# Patient Record
Sex: Female | Born: 1940 | Race: White | Hispanic: No | Smoking: Former smoker
Health system: Southern US, Community
[De-identification: ages and names within clinical notes are randomized; demographics above are authoritative.]

## PROBLEM LIST (undated history)

## (undated) DIAGNOSIS — F419 Anxiety disorder, unspecified: Secondary | ICD-10-CM

## (undated) DIAGNOSIS — J9612 Chronic respiratory failure with hypercapnia: Secondary | ICD-10-CM

## (undated) DIAGNOSIS — G4700 Insomnia, unspecified: Secondary | ICD-10-CM

## (undated) DIAGNOSIS — J9611 Chronic respiratory failure with hypoxia: Secondary | ICD-10-CM

## (undated) DIAGNOSIS — E119 Type 2 diabetes mellitus without complications: Secondary | ICD-10-CM

## (undated) DIAGNOSIS — C189 Malignant neoplasm of colon, unspecified: Secondary | ICD-10-CM

## (undated) DIAGNOSIS — J449 Chronic obstructive pulmonary disease, unspecified: Secondary | ICD-10-CM

## (undated) HISTORY — DX: Malignant neoplasm of colon, unspecified: C18.9

## (undated) HISTORY — PX: COLON SURGERY: SHX602

## (undated) HISTORY — DX: Insomnia, unspecified: G47.00

## (undated) HISTORY — PX: OTHER SURGICAL HISTORY: SHX169

## (undated) HISTORY — DX: Anxiety disorder, unspecified: F41.9

## (undated) HISTORY — PX: FOOT SURGERY: SHX648

---

## 2000-06-21 DIAGNOSIS — C189 Malignant neoplasm of colon, unspecified: Secondary | ICD-10-CM

## 2000-06-21 HISTORY — DX: Malignant neoplasm of colon, unspecified: C18.9

## 2000-12-18 ENCOUNTER — Encounter: Payer: Self-pay | Admitting: General Surgery

## 2000-12-18 ENCOUNTER — Inpatient Hospital Stay (HOSPITAL_COMMUNITY): Admission: EM | Admit: 2000-12-18 | Discharge: 2000-12-28 | Payer: Self-pay | Admitting: Emergency Medicine

## 2000-12-18 ENCOUNTER — Encounter: Payer: Self-pay | Admitting: Emergency Medicine

## 2000-12-18 ENCOUNTER — Encounter (INDEPENDENT_AMBULATORY_CARE_PROVIDER_SITE_OTHER): Payer: Self-pay | Admitting: Specialist

## 2000-12-19 ENCOUNTER — Encounter: Payer: Self-pay | Admitting: General Surgery

## 2000-12-20 ENCOUNTER — Encounter: Payer: Self-pay | Admitting: General Surgery

## 2001-01-30 ENCOUNTER — Other Ambulatory Visit: Admission: RE | Admit: 2001-01-30 | Discharge: 2001-01-30 | Payer: Self-pay | Admitting: General Surgery

## 2001-02-13 ENCOUNTER — Encounter (INDEPENDENT_AMBULATORY_CARE_PROVIDER_SITE_OTHER): Payer: Self-pay | Admitting: *Deleted

## 2001-02-13 ENCOUNTER — Ambulatory Visit (HOSPITAL_BASED_OUTPATIENT_CLINIC_OR_DEPARTMENT_OTHER): Admission: RE | Admit: 2001-02-13 | Discharge: 2001-02-13 | Payer: Self-pay | Admitting: General Surgery

## 2001-04-28 ENCOUNTER — Encounter: Payer: Self-pay | Admitting: Hematology and Oncology

## 2001-04-28 ENCOUNTER — Ambulatory Visit (HOSPITAL_COMMUNITY): Admission: RE | Admit: 2001-04-28 | Discharge: 2001-04-28 | Payer: Self-pay | Admitting: Hematology and Oncology

## 2001-10-03 ENCOUNTER — Ambulatory Visit (HOSPITAL_COMMUNITY): Admission: RE | Admit: 2001-10-03 | Discharge: 2001-10-03 | Payer: Self-pay | Admitting: Hematology and Oncology

## 2001-10-03 ENCOUNTER — Encounter: Payer: Self-pay | Admitting: Hematology and Oncology

## 2001-11-16 ENCOUNTER — Ambulatory Visit (HOSPITAL_COMMUNITY): Admission: RE | Admit: 2001-11-16 | Discharge: 2001-11-16 | Payer: Self-pay | Admitting: Gastroenterology

## 2001-11-16 ENCOUNTER — Encounter (INDEPENDENT_AMBULATORY_CARE_PROVIDER_SITE_OTHER): Payer: Self-pay | Admitting: *Deleted

## 2002-09-18 ENCOUNTER — Ambulatory Visit (HOSPITAL_COMMUNITY): Admission: RE | Admit: 2002-09-18 | Discharge: 2002-09-18 | Payer: Self-pay | Admitting: Hematology and Oncology

## 2002-09-18 ENCOUNTER — Encounter: Payer: Self-pay | Admitting: Hematology and Oncology

## 2003-01-23 ENCOUNTER — Encounter: Payer: Self-pay | Admitting: Oncology

## 2003-01-23 ENCOUNTER — Ambulatory Visit (HOSPITAL_COMMUNITY): Admission: RE | Admit: 2003-01-23 | Discharge: 2003-01-23 | Payer: Self-pay | Admitting: Oncology

## 2003-06-06 ENCOUNTER — Ambulatory Visit (HOSPITAL_COMMUNITY): Admission: RE | Admit: 2003-06-06 | Discharge: 2003-06-06 | Payer: Self-pay | Admitting: Oncology

## 2003-10-09 ENCOUNTER — Encounter: Admission: RE | Admit: 2003-10-09 | Discharge: 2003-10-09 | Payer: Self-pay | Admitting: Oncology

## 2003-11-19 ENCOUNTER — Ambulatory Visit (HOSPITAL_COMMUNITY): Admission: RE | Admit: 2003-11-19 | Discharge: 2003-11-19 | Payer: Self-pay | Admitting: Oncology

## 2003-12-20 ENCOUNTER — Ambulatory Visit (HOSPITAL_COMMUNITY): Admission: RE | Admit: 2003-12-20 | Discharge: 2003-12-20 | Payer: Self-pay | Admitting: Oncology

## 2004-04-24 ENCOUNTER — Ambulatory Visit: Payer: Self-pay | Admitting: Oncology

## 2004-04-29 ENCOUNTER — Ambulatory Visit (HOSPITAL_COMMUNITY): Admission: RE | Admit: 2004-04-29 | Discharge: 2004-04-29 | Payer: Self-pay | Admitting: Oncology

## 2004-09-21 ENCOUNTER — Ambulatory Visit (HOSPITAL_COMMUNITY): Admission: RE | Admit: 2004-09-21 | Discharge: 2004-09-21 | Payer: Self-pay | Admitting: Oncology

## 2004-10-01 ENCOUNTER — Ambulatory Visit: Payer: Self-pay | Admitting: Oncology

## 2005-03-29 ENCOUNTER — Ambulatory Visit: Payer: Self-pay | Admitting: Oncology

## 2005-09-17 ENCOUNTER — Encounter: Admission: RE | Admit: 2005-09-17 | Discharge: 2005-09-17 | Payer: Self-pay | Admitting: Oncology

## 2005-09-29 ENCOUNTER — Ambulatory Visit: Payer: Self-pay | Admitting: Oncology

## 2005-09-30 LAB — CBC WITH DIFFERENTIAL/PLATELET
Basophils Absolute: 0 10*3/uL (ref 0.0–0.1)
EOS%: 0.5 % (ref 0.0–7.0)
HGB: 14.6 g/dL (ref 11.6–15.9)
MCH: 31.4 pg (ref 26.0–34.0)
NEUT#: 7.6 10*3/uL — ABNORMAL HIGH (ref 1.5–6.5)
RDW: 13 % (ref 11.3–14.5)
lymph#: 0.8 10*3/uL — ABNORMAL LOW (ref 0.9–3.3)

## 2005-09-30 LAB — COMPREHENSIVE METABOLIC PANEL
ALT: 23 U/L (ref 0–40)
AST: 12 U/L (ref 0–37)
Albumin: 4.5 g/dL (ref 3.5–5.2)
BUN: 11 mg/dL (ref 6–23)
Calcium: 10.8 mg/dL — ABNORMAL HIGH (ref 8.4–10.5)
Chloride: 95 mEq/L — ABNORMAL LOW (ref 96–112)
Potassium: 4.7 mEq/L (ref 3.5–5.3)
Sodium: 140 mEq/L (ref 135–145)
Total Protein: 7.4 g/dL (ref 6.0–8.3)

## 2005-09-30 LAB — CEA: CEA: 5.5 ng/mL — ABNORMAL HIGH (ref 0.0–5.0)

## 2005-09-30 LAB — LACTATE DEHYDROGENASE: LDH: 151 U/L (ref 94–250)

## 2005-10-04 ENCOUNTER — Ambulatory Visit (HOSPITAL_COMMUNITY): Admission: RE | Admit: 2005-10-04 | Discharge: 2005-10-04 | Payer: Self-pay | Admitting: Oncology

## 2005-10-21 LAB — FECAL OCCULT BLOOD, GUAIAC: Occult Blood: POSITIVE

## 2006-01-31 ENCOUNTER — Ambulatory Visit: Payer: Self-pay | Admitting: Internal Medicine

## 2006-03-14 ENCOUNTER — Ambulatory Visit: Payer: Self-pay | Admitting: Internal Medicine

## 2006-03-29 ENCOUNTER — Ambulatory Visit: Payer: Self-pay | Admitting: Oncology

## 2006-03-31 LAB — CBC WITH DIFFERENTIAL/PLATELET
BASO%: 0.8 % (ref 0.0–2.0)
EOS%: 7.9 % — ABNORMAL HIGH (ref 0.0–7.0)
HCT: 40.1 % (ref 34.8–46.6)
LYMPH%: 23.9 % (ref 14.0–48.0)
MCH: 30.9 pg (ref 26.0–34.0)
MCHC: 33.8 g/dL (ref 32.0–36.0)
MONO#: 0.6 10*3/uL (ref 0.1–0.9)
NEUT%: 58.5 % (ref 39.6–76.8)
Platelets: 358 10*3/uL (ref 145–400)

## 2006-03-31 LAB — COMPREHENSIVE METABOLIC PANEL
ALT: 14 U/L (ref 0–40)
CO2: 28 mEq/L (ref 19–32)
Creatinine, Ser: 0.89 mg/dL (ref 0.40–1.20)
Total Bilirubin: 0.5 mg/dL (ref 0.3–1.2)

## 2006-03-31 LAB — CEA: CEA: 3.6 ng/mL (ref 0.0–5.0)

## 2006-07-26 ENCOUNTER — Ambulatory Visit: Payer: Self-pay | Admitting: Internal Medicine

## 2006-09-26 ENCOUNTER — Ambulatory Visit: Payer: Self-pay | Admitting: Oncology

## 2006-09-28 LAB — COMPREHENSIVE METABOLIC PANEL
ALT: 15 U/L (ref 0–35)
AST: 14 U/L (ref 0–37)
Albumin: 4.6 g/dL (ref 3.5–5.2)
CO2: 28 mEq/L (ref 19–32)
Calcium: 10.6 mg/dL — ABNORMAL HIGH (ref 8.4–10.5)
Chloride: 97 mEq/L (ref 96–112)
Potassium: 4.2 mEq/L (ref 3.5–5.3)
Total Protein: 7.8 g/dL (ref 6.0–8.3)

## 2006-09-28 LAB — CBC WITH DIFFERENTIAL/PLATELET
BASO%: 1 % (ref 0.0–2.0)
EOS%: 0.3 % (ref 0.0–7.0)
HGB: 14.2 g/dL (ref 11.6–15.9)
MCH: 30.7 pg (ref 26.0–34.0)
MCHC: 35.3 g/dL (ref 32.0–36.0)
MONO#: 0.4 10*3/uL (ref 0.1–0.9)
RDW: 11.2 % — ABNORMAL LOW (ref 11.3–14.5)
WBC: 7.9 10*3/uL (ref 3.9–10.0)
lymph#: 0.9 10*3/uL (ref 0.9–3.3)

## 2006-09-30 ENCOUNTER — Ambulatory Visit (HOSPITAL_COMMUNITY): Admission: RE | Admit: 2006-09-30 | Discharge: 2006-09-30 | Payer: Self-pay | Admitting: Oncology

## 2006-11-23 ENCOUNTER — Ambulatory Visit: Payer: Self-pay | Admitting: Internal Medicine

## 2007-09-26 ENCOUNTER — Ambulatory Visit: Payer: Self-pay | Admitting: Oncology

## 2007-12-29 ENCOUNTER — Ambulatory Visit: Payer: Self-pay | Admitting: Internal Medicine

## 2007-12-29 DIAGNOSIS — C189 Malignant neoplasm of colon, unspecified: Secondary | ICD-10-CM | POA: Insufficient documentation

## 2007-12-29 DIAGNOSIS — J449 Chronic obstructive pulmonary disease, unspecified: Secondary | ICD-10-CM | POA: Insufficient documentation

## 2007-12-29 LAB — CONVERTED CEMR LAB: Theophylline Lvl: 7.8 ug/mL — ABNORMAL LOW (ref 10.0–20.0)

## 2008-01-05 ENCOUNTER — Telehealth (INDEPENDENT_AMBULATORY_CARE_PROVIDER_SITE_OTHER): Payer: Self-pay | Admitting: *Deleted

## 2008-03-06 ENCOUNTER — Encounter (HOSPITAL_COMMUNITY): Admission: RE | Admit: 2008-03-06 | Discharge: 2008-05-30 | Payer: Self-pay | Admitting: Internal Medicine

## 2008-03-12 ENCOUNTER — Encounter: Payer: Self-pay | Admitting: Internal Medicine

## 2008-03-29 ENCOUNTER — Inpatient Hospital Stay (HOSPITAL_COMMUNITY): Admission: EM | Admit: 2008-03-29 | Discharge: 2008-04-11 | Payer: Self-pay | Admitting: Emergency Medicine

## 2008-03-29 ENCOUNTER — Ambulatory Visit: Payer: Self-pay | Admitting: Internal Medicine

## 2008-03-29 ENCOUNTER — Ambulatory Visit: Payer: Self-pay | Admitting: Cardiology

## 2008-04-01 ENCOUNTER — Encounter: Payer: Self-pay | Admitting: Internal Medicine

## 2008-04-04 ENCOUNTER — Encounter: Payer: Self-pay | Admitting: Critical Care Medicine

## 2008-04-08 ENCOUNTER — Telehealth (INDEPENDENT_AMBULATORY_CARE_PROVIDER_SITE_OTHER): Payer: Self-pay | Admitting: *Deleted

## 2008-04-11 ENCOUNTER — Encounter: Payer: Self-pay | Admitting: Internal Medicine

## 2008-04-12 ENCOUNTER — Telehealth (INDEPENDENT_AMBULATORY_CARE_PROVIDER_SITE_OTHER): Payer: Self-pay | Admitting: *Deleted

## 2008-04-18 ENCOUNTER — Telehealth (INDEPENDENT_AMBULATORY_CARE_PROVIDER_SITE_OTHER): Payer: Self-pay | Admitting: *Deleted

## 2008-04-22 ENCOUNTER — Telehealth: Payer: Self-pay | Admitting: Internal Medicine

## 2008-04-29 ENCOUNTER — Ambulatory Visit: Payer: Self-pay | Admitting: Internal Medicine

## 2008-05-08 ENCOUNTER — Encounter: Payer: Self-pay | Admitting: Internal Medicine

## 2008-05-08 DIAGNOSIS — J9611 Chronic respiratory failure with hypoxia: Secondary | ICD-10-CM | POA: Insufficient documentation

## 2008-06-03 ENCOUNTER — Telehealth: Payer: Self-pay | Admitting: Internal Medicine

## 2008-07-09 ENCOUNTER — Telehealth (INDEPENDENT_AMBULATORY_CARE_PROVIDER_SITE_OTHER): Payer: Self-pay | Admitting: *Deleted

## 2008-07-22 ENCOUNTER — Telehealth: Payer: Self-pay | Admitting: Emergency Medicine

## 2008-07-22 ENCOUNTER — Ambulatory Visit: Payer: Self-pay | Admitting: Internal Medicine

## 2008-07-22 ENCOUNTER — Inpatient Hospital Stay (HOSPITAL_COMMUNITY): Admission: EM | Admit: 2008-07-22 | Discharge: 2008-07-24 | Payer: Self-pay | Admitting: Emergency Medicine

## 2008-07-22 ENCOUNTER — Telehealth: Payer: Self-pay | Admitting: Internal Medicine

## 2008-07-29 ENCOUNTER — Ambulatory Visit: Payer: Self-pay | Admitting: Internal Medicine

## 2008-08-13 ENCOUNTER — Telehealth: Payer: Self-pay | Admitting: Internal Medicine

## 2008-08-27 ENCOUNTER — Encounter: Payer: Self-pay | Admitting: Internal Medicine

## 2008-09-17 ENCOUNTER — Ambulatory Visit: Payer: Self-pay | Admitting: Internal Medicine

## 2008-09-18 ENCOUNTER — Telehealth (INDEPENDENT_AMBULATORY_CARE_PROVIDER_SITE_OTHER): Payer: Self-pay | Admitting: *Deleted

## 2008-10-11 ENCOUNTER — Telehealth (INDEPENDENT_AMBULATORY_CARE_PROVIDER_SITE_OTHER): Payer: Self-pay | Admitting: *Deleted

## 2008-12-13 ENCOUNTER — Telehealth (INDEPENDENT_AMBULATORY_CARE_PROVIDER_SITE_OTHER): Payer: Self-pay | Admitting: *Deleted

## 2009-01-06 ENCOUNTER — Telehealth: Payer: Self-pay | Admitting: Internal Medicine

## 2009-01-13 ENCOUNTER — Telehealth (INDEPENDENT_AMBULATORY_CARE_PROVIDER_SITE_OTHER): Payer: Self-pay | Admitting: *Deleted

## 2009-01-16 ENCOUNTER — Telehealth: Payer: Self-pay | Admitting: Internal Medicine

## 2009-01-16 ENCOUNTER — Ambulatory Visit: Payer: Self-pay | Admitting: Internal Medicine

## 2009-01-22 ENCOUNTER — Encounter: Payer: Self-pay | Admitting: Internal Medicine

## 2009-02-25 ENCOUNTER — Telehealth (INDEPENDENT_AMBULATORY_CARE_PROVIDER_SITE_OTHER): Payer: Self-pay | Admitting: *Deleted

## 2009-03-18 ENCOUNTER — Encounter: Payer: Self-pay | Admitting: Internal Medicine

## 2009-03-20 ENCOUNTER — Encounter: Payer: Self-pay | Admitting: Internal Medicine

## 2009-04-08 ENCOUNTER — Emergency Department (HOSPITAL_COMMUNITY): Admission: EM | Admit: 2009-04-08 | Discharge: 2009-04-08 | Payer: Self-pay | Admitting: Emergency Medicine

## 2009-04-21 ENCOUNTER — Telehealth (INDEPENDENT_AMBULATORY_CARE_PROVIDER_SITE_OTHER): Payer: Self-pay | Admitting: *Deleted

## 2009-05-09 ENCOUNTER — Ambulatory Visit: Payer: Self-pay | Admitting: Internal Medicine

## 2009-05-12 ENCOUNTER — Telehealth (INDEPENDENT_AMBULATORY_CARE_PROVIDER_SITE_OTHER): Payer: Self-pay | Admitting: *Deleted

## 2009-05-14 DIAGNOSIS — G4700 Insomnia, unspecified: Secondary | ICD-10-CM | POA: Insufficient documentation

## 2009-06-04 ENCOUNTER — Telehealth (INDEPENDENT_AMBULATORY_CARE_PROVIDER_SITE_OTHER): Payer: Self-pay | Admitting: *Deleted

## 2009-06-05 ENCOUNTER — Encounter: Payer: Self-pay | Admitting: Internal Medicine

## 2009-06-06 ENCOUNTER — Encounter: Payer: Self-pay | Admitting: Internal Medicine

## 2009-06-24 ENCOUNTER — Encounter: Payer: Self-pay | Admitting: Internal Medicine

## 2009-07-21 ENCOUNTER — Ambulatory Visit: Payer: Self-pay | Admitting: Internal Medicine

## 2009-07-24 ENCOUNTER — Telehealth: Payer: Self-pay | Admitting: Internal Medicine

## 2009-08-04 ENCOUNTER — Telehealth: Payer: Self-pay | Admitting: Internal Medicine

## 2009-08-04 ENCOUNTER — Telehealth (INDEPENDENT_AMBULATORY_CARE_PROVIDER_SITE_OTHER): Payer: Self-pay | Admitting: *Deleted

## 2009-10-01 IMAGING — CR DG CHEST 1V PORT
1 series · 1 of 1 positions shown · non-contrast
Comparison: [REDACTED] portable chest x-ray 04/01/2008.

CLINICAL DATA: Respiratory arrest.  COPD.  Right PICC line.

PORTABLE CHEST - 1 VIEW

[view not recorded]
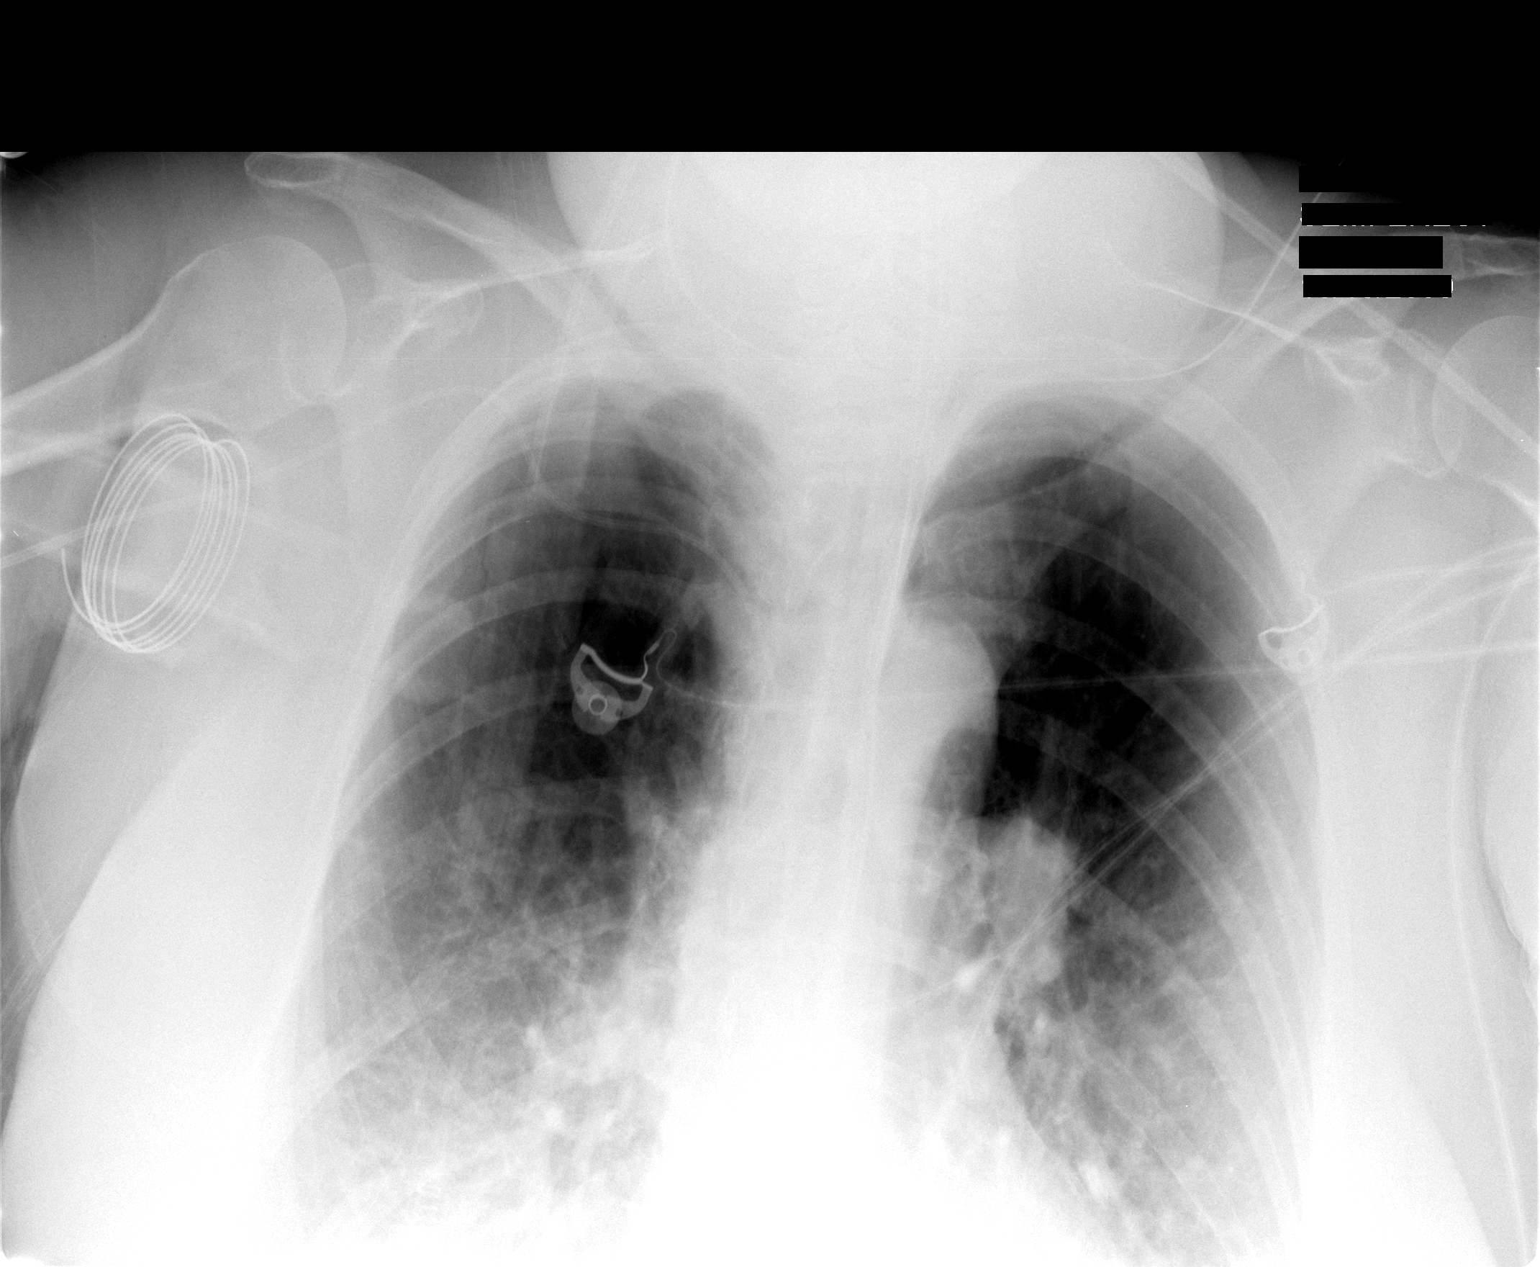

[1 of 1 positions shown; findings below may reference images not displayed]

FINDINGS: Again difficulty visualizing inferior aspect endotracheal
tube and right PICC line.  Endotracheal tube probably ends 2.7 cm
above carina with right PICC line ending in the probable distal
superior vena cava.  No pneumothorax seen.  The upper lung lucency
consistent with COPD and slight bibasilar pulmonary vascular
congestion findings stable.  Base of heart and lung bases currently
not included for assessment.
IMPRESSION: 1.  Stable probable satisfactory position of endotracheal tube and
right PICC line.
2.  Upper lung COPD with probable slight bibasilar vascular
congestion.
3.  Base of heart and lung base is not included for assessment.

## 2009-10-02 IMAGING — CR DG CHEST 1V PORT
1 series · 1 of 1 positions shown · non-contrast
Comparison: 04/02/2008

CLINICAL DATA: Respiratory distress, ventilator.

PORTABLE CHEST - 1 VIEW

[view not recorded]
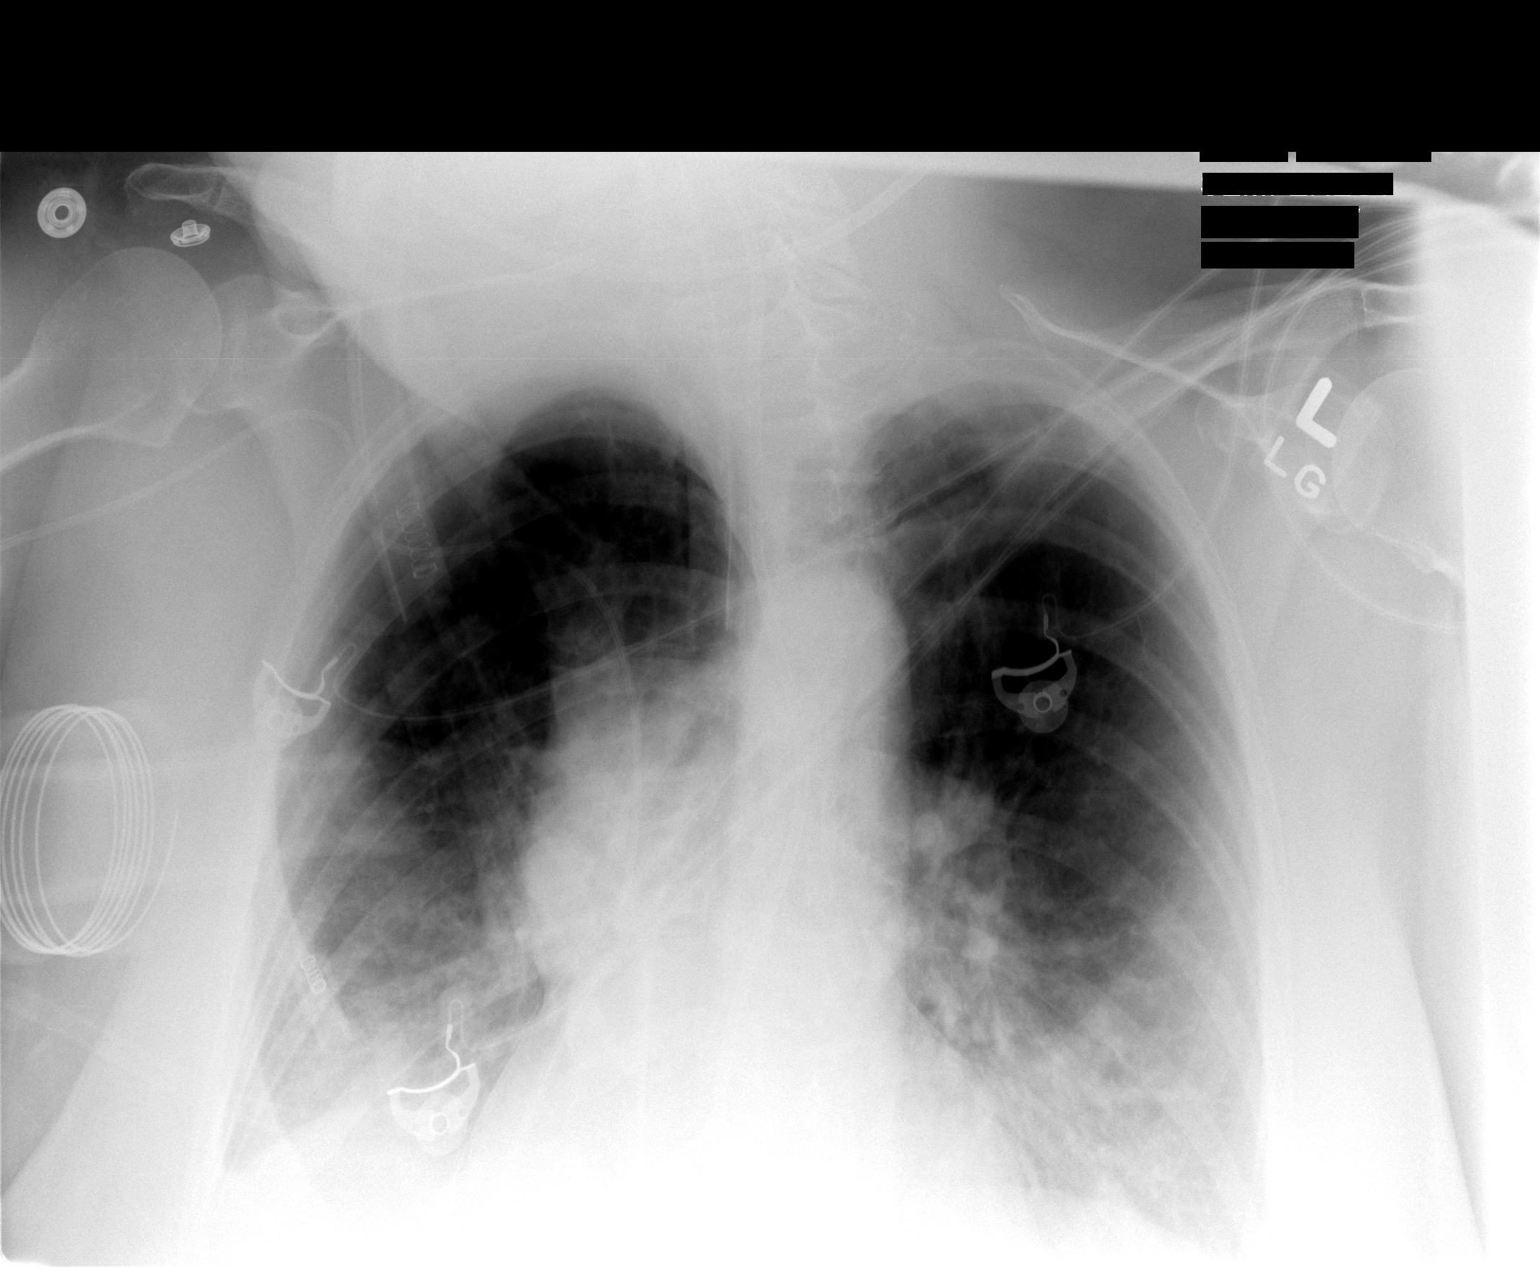

[1 of 1 positions shown; findings below may reference images not displayed]

FINDINGS: Endotracheal tube terminates approximately 2.2 cm above
the carina.  Right PICC tip projects over the SVC.  Feeding tube is
followed into the distal esophagus, with the tip projecting beyond
the inferior boundary of the film.  Heart size stable.  Mixed
interstitial and air space disease, and probable bilateral pleural
effusions persist.
IMPRESSION: Probable congestive heart failure, stable.

## 2009-10-09 ENCOUNTER — Encounter: Payer: Self-pay | Admitting: Internal Medicine

## 2009-12-26 ENCOUNTER — Telehealth (INDEPENDENT_AMBULATORY_CARE_PROVIDER_SITE_OTHER): Payer: Self-pay | Admitting: *Deleted

## 2009-12-30 ENCOUNTER — Ambulatory Visit: Payer: Self-pay | Admitting: Internal Medicine

## 2009-12-30 DIAGNOSIS — B370 Candidal stomatitis: Secondary | ICD-10-CM

## 2010-01-20 ENCOUNTER — Encounter: Payer: Self-pay | Admitting: Internal Medicine

## 2010-01-20 ENCOUNTER — Encounter: Admission: RE | Admit: 2010-01-20 | Discharge: 2010-01-20 | Payer: Self-pay | Admitting: Family Medicine

## 2010-01-29 ENCOUNTER — Telehealth (INDEPENDENT_AMBULATORY_CARE_PROVIDER_SITE_OTHER): Payer: Self-pay | Admitting: *Deleted

## 2010-04-01 ENCOUNTER — Telehealth: Payer: Self-pay | Admitting: Internal Medicine

## 2010-04-03 ENCOUNTER — Telehealth (INDEPENDENT_AMBULATORY_CARE_PROVIDER_SITE_OTHER): Payer: Self-pay | Admitting: *Deleted

## 2010-04-07 ENCOUNTER — Encounter: Payer: Self-pay | Admitting: Internal Medicine

## 2010-04-08 ENCOUNTER — Telehealth (INDEPENDENT_AMBULATORY_CARE_PROVIDER_SITE_OTHER): Payer: Self-pay | Admitting: *Deleted

## 2010-04-29 ENCOUNTER — Telehealth (INDEPENDENT_AMBULATORY_CARE_PROVIDER_SITE_OTHER): Payer: Self-pay | Admitting: *Deleted

## 2010-04-30 ENCOUNTER — Ambulatory Visit: Payer: Self-pay | Admitting: Internal Medicine

## 2010-04-30 DIAGNOSIS — R220 Localized swelling, mass and lump, head: Secondary | ICD-10-CM

## 2010-04-30 DIAGNOSIS — R221 Localized swelling, mass and lump, neck: Secondary | ICD-10-CM | POA: Insufficient documentation

## 2010-05-05 ENCOUNTER — Ambulatory Visit: Payer: Self-pay | Admitting: Thoracic Surgery

## 2010-05-10 ENCOUNTER — Encounter: Payer: Self-pay | Admitting: Internal Medicine

## 2010-05-21 ENCOUNTER — Encounter: Payer: Self-pay | Admitting: Internal Medicine

## 2010-05-21 ENCOUNTER — Telehealth: Payer: Self-pay | Admitting: Internal Medicine

## 2010-05-25 ENCOUNTER — Inpatient Hospital Stay (HOSPITAL_COMMUNITY)
Admission: RE | Admit: 2010-05-25 | Discharge: 2010-05-27 | Payer: Self-pay | Source: Home / Self Care | Attending: Thoracic Surgery | Admitting: Thoracic Surgery

## 2010-05-25 ENCOUNTER — Encounter: Payer: Self-pay | Admitting: Thoracic Surgery

## 2010-06-02 ENCOUNTER — Ambulatory Visit: Payer: Self-pay | Admitting: Thoracic Surgery

## 2010-06-04 ENCOUNTER — Encounter: Payer: Self-pay | Admitting: Internal Medicine

## 2010-06-18 ENCOUNTER — Telehealth: Payer: Self-pay | Admitting: Internal Medicine

## 2010-06-18 ENCOUNTER — Encounter: Payer: Self-pay | Admitting: Internal Medicine

## 2010-06-23 ENCOUNTER — Telehealth (INDEPENDENT_AMBULATORY_CARE_PROVIDER_SITE_OTHER): Payer: Self-pay | Admitting: *Deleted

## 2010-06-24 ENCOUNTER — Telehealth (INDEPENDENT_AMBULATORY_CARE_PROVIDER_SITE_OTHER): Payer: Self-pay | Admitting: *Deleted

## 2010-06-24 ENCOUNTER — Encounter: Payer: Self-pay | Admitting: Internal Medicine

## 2010-06-25 ENCOUNTER — Telehealth (INDEPENDENT_AMBULATORY_CARE_PROVIDER_SITE_OTHER): Payer: Self-pay | Admitting: *Deleted

## 2010-06-26 ENCOUNTER — Encounter: Payer: Self-pay | Admitting: Internal Medicine

## 2010-06-26 ENCOUNTER — Telehealth (INDEPENDENT_AMBULATORY_CARE_PROVIDER_SITE_OTHER): Payer: Self-pay | Admitting: *Deleted

## 2010-07-09 ENCOUNTER — Encounter: Payer: Self-pay | Admitting: Internal Medicine

## 2010-07-11 ENCOUNTER — Encounter: Payer: Self-pay | Admitting: Oncology

## 2010-07-12 ENCOUNTER — Encounter: Payer: Self-pay | Admitting: Oncology

## 2010-07-21 NOTE — Progress Notes (Signed)
Summary: rx renewals  Phone Note Call from Patient Call back at Home Phone [phone]   Caller: Patient Call For: YOUNG Summary of Call: pt needs refills. they have run out. she will give list to nurse when she speaks to her. pt wants rx's [facility]ed [facility] ([facility] pt) but pt didn't have a fx #. i offered to schedule an rov w/ dr young but pt declined at this time.  Initial call taken by: Tivis Ringer, CNA,  April 01, 2010 12:57 PM  Follow-up for Phone Call        Spoke with pt-needs breathing nebs RX's (3 kinds) [facility] [phone]; Faxing refill request to [place] to have CDY sign and fax back.Reynaldo Minium CMA  April 01, 2010 3:22 PM     Prescriptions: IPRATROPIUM BROMIDE 0.03 % SOLN (IPRATROPIUM BROMIDE) take four times a day  #120 x 3   Entered by:   Reynaldo Minium CMA   Authorized by:   Waymon Budge MD   Signed by:   Reynaldo Minium CMA on 04/01/2010   Method used:   Historical   RxID:   [id number] BUDESONIDE 0.5 MG/2ML SUSP (BUDESONIDE) take 1 by mouth two times a day  #60 x 3   Entered by:   Reynaldo Minium CMA   Authorized by:   Waymon Budge MD   Signed by:   Reynaldo Minium CMA on 04/01/2010   Method used:   Historical   RxID:   [id number] ALBUTEROL SULFATE (2.5 MG/3ML) 0.083% NEBU (ALBUTEROL SULFATE) 1 vial in nebulizer 4 times per day as needed  #120 x 3   Entered by:   Reynaldo Minium CMA   Authorized by:   Waymon Budge MD   Signed by:   Reynaldo Minium CMA on 04/01/2010   Method used:   Historical   RxID:   [id number]

## 2010-07-21 NOTE — Medication Information (Signed)
Summary[facility]  [facility]   Imported By: Sherian Rein 05/25/2010 11:57:26  _____________________________________________________________________  External Attachment:    Type:   Image     Comment:   External Document

## 2010-07-21 NOTE — Medication Information (Signed)
Summary: Nebulizer/[facility]  [facility]ebulizer/[facility]   Imported By: Sherian Rein 10/14/2009 10:52:06  _____________________________________________________________________  External Attachment:    Type:   Image     Comment:   External Document

## 2010-07-21 NOTE — Progress Notes (Signed)
Summary: REQUESTING Meredith [name] ASAP  Phone Note Call from Patient Call back at Home Phone [phone]   Caller: pt Call For: CY Summary of Call: pt requesting a refill on [facility]olam. [facility] [place] Initial call taken by: Carron Curie CMA,  May 21, 2010 3:07 PM  Follow-up for Phone Call        Pt requesting refill on alprazolam 1mg .  [facility] [place] Last OV 04/30/2010, No pending OV Rx last faxed to pharm on 01/29/2010 #90 x 3 Dr. Maple Hudson, pls advise if ok.  If so, # of fills.  Thanks!  Pt calling on status of her rx.Darletta Moll  May 22, 2010 1:15 PM  Follow-up by: Gweneth Dimitri RN,  May 21, 2010 3:16 PM  Additional Follow-up for Phone Call Additional follow up Details #1::        Dr. Maple Hudson, pt calling back re: requesting xanax rx.  Would like this dones ASAP.  Also would like you to know neck surgery is on hold for now.  Pls advise. Gweneth Dimitri RN  May 22, 2010 1:58 PM     Additional Follow-up for Phone Call Additional follow up Details #2::    Per CDY-okay to refill Alprazolam 1mg  #90 take 1 three times a day as needed with 3 refills.Reynaldo Minium CMA  May 22, 2010 3:59 PM   rx sent. pt aware.Carron Curie CMA  May 22, 2010 4:08 PM   Prescriptions: ALPRAZOLAM 1 MG TABS (ALPRAZOLAM) 1/2- 1 three times a day as needed  #90 x 3   Entered by:   Carron Curie CMA   Authorized by:   Waymon Budge MD   Signed by:   Carron Curie CMA on 05/22/2010   Method used:   Telephoned to ...       [facility]  [place] [address] (retail)       [address]       [place], [place]  [address]       Ph: [phone] or [phone]       Fax: [phone]   RxID:   [id number]

## 2010-07-21 NOTE — Progress Notes (Signed)
Summary: Requesting cy recs  Phone Note Call from Patient   Caller: Daughter lisa Call For: young Summary of Call: have questions about done at [facility] imaging. Initial call taken by: Rickard Patience,  April 29, 2010 3:41 PM  Follow-up for Phone Call        Called, spoke with pt.  She states she had a lump develop on her neck and went to PCP for this.  PCP sent her to [facility] for Scan.  Per pt, PCP is now rec surgery and has made appt with Dr. Edwyna Shell on 11.15.11.  Pt is requesting CY look at images and advise if he thinks surgery will be ok.  Also would like to know if he will want to see her before Feb 2012 as that is when she is supposed to follow up.  Dr. Maple Hudson, pls advise.  Thanks! Follow-up by: Gweneth Dimitri RN,  April 29, 2010 4:05 PM  Additional Follow-up for Phone Call Additional follow up Details #1::        Spoke with patient-appt made for 2pm Thursday with CDY to review scan results.Reynaldo Minium CMA  April 29, 2010 5:07 PM

## 2010-07-21 NOTE — Progress Notes (Signed)
Summary: ALPRAZOLAM- Refill already sent, ATC NA  Phone Note Call from Patient Call back at Home Phone [phone]   Caller: Patient Call For: young Summary of Call: pt needs rx for alprazolam [facility] [place].  Initial call taken by: Tivis Ringer, CNA,  January 29, 2010 12:14 PM  Follow-up for Phone Call        Katie already faxed rx for this- ATC and inform pt, NA and no option to leave a msg, WCB. Vernie Murders  January 29, 2010 12:18 PM  Spoke with pt and notified that rx was already sent  Follow-up by: Vernie Murders,  January 29, 2010 1:53 PM

## 2010-07-21 NOTE — Progress Notes (Signed)
Summary: prescript  Phone Note Call from Patient   Caller: Patient Call For: young Summary of Call: need refill for [facility]raz[facility]am [facility] [place] Initial call taken by: Rickard Patience,  December 26, 2009 9:34 AM  Follow-up for Phone Call        Pt's last OV 07/21/2009 and was advised by CY to follow up in 4 months. Pt had CAN appointments 11-18-2009 and 12-12-2009, no pending OV scheduled. Please advise. Thanks. Zackery Barefoot CMA  December 26, 2009 9:51 AM   Medication Allergy: Morphine  Additional Follow-up for Phone Call Additional follow up Details #1::        Denied.  Additional Follow-up by: Waymon Budge MD,  December 26, 2009 12:36 PM    Additional Follow-up for Phone Call Additional follow up Details #2::    Spoke with pt and explained that due to her not following up as advised, we can not refill this rx at this time.  P-t verbalized understanding and appt was sched for 12/30/09 for followup. Follow-up by: Vernie Murders,  December 26, 2009 1:38 PM

## 2010-07-21 NOTE — Progress Notes (Signed)
Summary: prescriptions    Phone Note From Pharmacy Call back at [phone] ext [phone]   Caller: patricia//[facility]  Call For: young  Summary of Call: States they haven't received fax for rx renewal, pls advise. Initial call taken by: Darletta Moll,  April 03, 2010 3:46 PM  Follow-up for Phone Call        LMOVM for [facility] TCB. Vernie Murders  April 03, 2010 4:13 PM  [facility] for patricia TCB.  Arman Filter LPN  April 06, 2010 5:14 PM   called and spoke with patricia from [facility].  She states she never received rx we faxed on 04/01/2010 for refill on pt's neb meds.  informed her i would refax to their fax # at [phone].  printed off rx for CY to sign so I can fax to [facility].  Arman Filter LPN  April 08, 2010 3:04 PM   Additional Follow-up for Phone Call Additional follow up Details #1::        RX's signed and faxed as requested.Reynaldo Minium CMA  April 08, 2010 4:37 PM     Prescriptions: IPRATROPIUM BROMIDE 0.03 % SOLN (IPRATROPIUM BROMIDE) take four times a day  #120 x 3   Entered by:   Arman Filter LPN   Authorized by:   Waymon Budge MD   Signed by:   Arman Filter LPN on 29/56/2130   Method used:   Printed then faxed to ...       [facility]  [place] [address] (retail)       [address]       [place], [place]  [address]       Ph: [phone] or [phone]       Fax: [phone]   RxID:   [id number] BUDESONIDE 0.5 MG/2ML SUSP (BUDESONIDE) take 1 by mouth two times a day  #60 x 3   Entered by:   Arman Filter LPN   Authorized by:   Waymon Budge MD   Signed by:   Arman Filter LPN on 95/18/8416   Method used:   Printed then faxed to ...       [facility]  [place] [address] (retail)       [address]       [place], [place]  [address]       Ph: [phone] or [phone]       Fax: [phone]   RxID:   [id number] ALBUTEROL SULFATE (2.5 MG/3ML) 0.083% NEBU (ALBUTEROL SULFATE) 1 vial in nebulizer 4 times per day as needed  #120 x 3   Entered by:    Arman Filter LPN   Authorized by:   Waymon Budge MD   Signed by:   Arman Filter LPN on 94/85/4627   Method used:   Printed then faxed to ...       [facility]  [place] [address] (retail)       [address]       [place], [place]  [address]       Ph: [phone] or [phone]       Fax: [phone]   RxID:   [id number]

## 2010-07-21 NOTE — Progress Notes (Signed)
Summary: fax verification  Phone Note Call from Patient   Caller: Daughter-Tammy Call For: young Summary of Call: caller wants to verify that FMLA papers that were faxed to our office fri 08/01/09 have been received. call tammy calhoun [phone] Initial call taken by: Tivis Ringer, CNA,  August 04, 2009 9:30 AM  Follow-up for Phone Call        Florentina Addison, have you received these forms yet? Please advise thanks! Vernie Murders  August 04, 2009 9:35 AM   As of this time I have not reveived any papers for pt; please advise pt of this and also the policy of [employer] taking care of FMLA; papers should be sent there. Reynaldo Minium CMA  August 04, 2009 12:13 PM   Additional Follow-up for Phone Call Additional follow up Details #1::        [email or web address] Vernie Murders  August 04, 2009 12:15 PM   Tammy returning call.  Carlyle Lipa had not received paperwork.  Said she would refax.  Lehman Prom  August 04, 2009 12:30 PM  Katie have you received thsi paperwork yet? Carron Curie CMA  August 06, 2009 9:29 AM     Additional Follow-up for Phone Call Additional follow up Details #2::    Spoke with Fleet Contras in [facility]; she has gotten the papers and will work on it today and send for CDY to sign; once done they will contact her to pick up in medical records. Reynaldo Minium CMA  August 06, 2009 11:41 AM

## 2010-07-21 NOTE — Progress Notes (Signed)
Summary: results  Phone Note Call from Patient   Caller: Patient Call For: Meredith [name] Summary of Call: pt calling for chest xray results Initial call taken by: Rickard Patience,  July 24, 2009 1:52 PM  Follow-up for Phone Call        results unsigned in EMR.  Will forward message to CY-please advise.  A copy of CXR is attached.  Thanks! Gweneth Dimitri RN  July 24, 2009 2:14 PM   Additional Follow-up for Phone Call Additional follow up Details #1::        Ready Additional Follow-up by: Waymon Budge MD,  July 24, 2009 2:52 PM     Appended Document: results Pt aware of CXR results as stated by CY.  She verbalized understanding and had no further questions.

## 2010-07-21 NOTE — Medication Information (Signed)
Summary[facility]  [facility]   Imported By: Sherian Rein 01/01/2010 14:01:13  _____________________________________________________________________  External Attachment:    Type:   Image     Comment:   External Document

## 2010-07-21 NOTE — Letter (Signed)
Summary: CMN for transport chair/[facility]  CMN for transport chair/[facility]   Imported By: Sherian Rein 06/27/2009 11:23:14  _____________________________________________________________________  External Attachment:    Type:   Image     Comment:   External Document

## 2010-07-21 NOTE — Progress Notes (Signed)
Summary: refill on neb meds  Phone Note Call from Patient Call back at Home Phone [phone]   Caller: Patient Call For: young Reason for Call: Refill Medication Summary of Call: Need a rx for [facility] to mail her albuterol sulfate 2.5mg . Initial call taken by: Darletta Moll,  April 08, 2010 1:20 PM  Follow-up for Phone Call        called and spoke with pt.  pt states [facility] still hasn't received rx we faxed on 04/01/2010.  informed pt i would refax these rx today. nothing further needed.  Aundra Millet Reynolds LPN  April 08, 2010 3:01 PM

## 2010-07-21 NOTE — Assessment & Plan Note (Signed)
Summary: review scan with patient/kcw   Primary Provider/Referring Provider:  Erenest Rasher - [place]  CC:  Review CT neck as 2nd opinon per pt..  History of Present Illness: July 21, 2009- COPD, chronic respirtatory failure, hx colon cancer.....................daughters here She had rash on right palm, then residual pins and needles Right palm. Dr Andrey Campanile speculated shingles and gave cortisone. Has finished burst and back to maintenance prednisone. She says pain is the problem now, since he saw here 2 weeks ago. Chest is unchanged, keeping cough productive of white mucus when she takes Neb. She stays on oxygen.  December 30, 2009- COPD, chronic respiratory failure, hx colon cancer.....................family here She coughs productively only when she uses her neb. Staying in mostly. Oxygen stays at 3L.The heat outdoors is drawing her energy, but nothing seems really new. Discussed med refills. She is curious about going back to [facility] and I am giving her the number so she can call..  April 30, 2010- COPD, chronic respiratory failure, hx colon cancer.....................family here Nurse-CC: Review CT neck as 2nd opinon per pt. Has developed a large soft prominence on right neck.  CT neck on August 2 showed large cystic lesion right neck. Dr Zachery Dakins saw and referred to Dr Edwyna Shell. Family wanted another opinion from me. We reviewed the CT neck images. She is using her meds regularly and denies recent change in her breathing. . Little cough. No pain or fever.   Preventive Screening-Counseling & Management  Alcohol-Tobacco     Smoking Status: quit > 6 months     Year Quit: 2010     Pack years: 40 years x 2 ppd     Passive Smoke Exposure: yes  Current Medications (verified): 1)  Theochron 200 Mg  Tb12 (Theophylline) .... Take 1 By Mouth Two Times A Day With Food 2)  Prednisone 5 Mg  Tabs (Prednisone) .Marland Kitchen.. 1-2 Daily 3)  Alprazolam 1 Mg Tabs (Alprazolam) .... 1/2- 1 Three  Times A Day As Needed 4)  Oxygen 2 L/m Nsleep and Prn 5)  Brovana 15 Mcg/51ml Nebu (Arformoterol Tartrate) .Marland Kitchen.. 1 Vial Via [employer] Two Times A Day 6)  Restoril 15 Mg Caps (Temazepam) .... Take 1 Tab By Mouth At Bedtime As Needed 7)  Proair Hfa 108 (90 Base) Mcg/act Aers (Albuterol Sulfate) .... 2 Puffs Four Times A Day As Needed Rescue 8)  Albuterol Sulfate (2.5 Mg/43ml) 0.083% Nebu (Albuterol Sulfate) .Marland Kitchen.. 1 Vial in Nebulizer 4 Times Per Day As Needed 9)  Budesonide 0.5 Mg/31ml Susp (Budesonide) .... Take 1 By Mouth Two Times A Day 10)  Ipratropium Bromide 0.03 % Soln (Ipratropium Bromide) .... Take Four Times A Day 11)  Duke's Magic Mouthwash .... 1 Teaspoon Swish and Spit Four Times A Day  Allergies (verified): 1)  Morphine  Past History:  Past Medical History: Last updated: 05/09/2009 COPD Hosp 10/9-22/09- Acute on chronic resp failure/ vent/ permissive hypercapnea HX colon cancer Anxiety Insomnia  Past Surgical History: Last updated: 2008/01/27 Colon cancer surgery  Family History: Last updated: 01/27/08 Mother - COPD Father - died of MI  Social History: Last updated: 12/30/2009 Lives with daughter retired Patient states former smoker.   Risk Factors: Smoking Status: quit > 6 months (04/30/2010) Passive Smoke Exposure: yes (04/30/2010)  Review of Systems      See HPI       The patient complains of shortness of breath with activity.  The patient denies shortness of breath at rest, productive cough, non-productive cough, coughing up blood, chest  pain, irregular heartbeats, acid heartburn, indigestion, loss of appetite, weight change, abdominal pain, difficulty swallowing, sore throat, tooth/dental problems, headaches, nasal congestion/difficulty breathing through nose, sneezing, itching, rash, change in color of mucus, and fever.    Vital Signs:  Patient profile:   70 year old female Height:      66 inches O2 Sat:      90 % on Room air Pulse rate:   73 / minute BP  sitting:   128 / 70  (left arm) Cuff size:   regular  Vitals Entered By: Reynaldo Minium CMA (April 30, 2010 2:10 PM)  O2 Flow:  Room air CC: Review CT neck as 2nd opinon per pt.   Physical Exam  Additional Exam:  General: A/Ox3; pleasant and cooperative, NAD, supplemental O2  90% 3L sat, wheelchair SKIN: no rash, lesions NODES: no lymphadenopathy HEENT: [place]/AT, EOM- WNL, Conjuctivae- clear, PERRLA, TM-WNL, Nose- clear, Throat- clear and wnl, Mallampti  II NECK: Supple w/ fair ROM, JVD- none, normal carotid impulses w/o bruits. Large fluctuant/ tense fluid bulge over right supraclavicular area, Not tender.No bruit CHEST: Poor airflow, very distant, no rales or rhonchi, unlabored HEART: RRR, no m/g/r heard ABDOMEN: Soft  JYN:WGNF, nl pulses, no edema . No visible rash. . No bruit heard. NEURO: Grossly intact to observation      Impression & Recommendations:  Problem # 1:  NECK MASS (ICD-784.2) This feels like a fluid filled structure- blood or lymph. She doesn't remember trauma. It is very appropriate that she keep appointment with Dr Edwyna Shell.   Problem # 2:  CHRONIC RESPIRATORY FAILURE (ICD-518.83) She has severe chronic hypoxic respiratory failure with COPD, but is at her baseline now. She could certainly tolerate aspiration if that were appropriate, but would be at increased risk of respiratory complications and would have to accept that, if general anesthesia were needed.   Other Orders: Est. Patient Level IV (62130) Influenza Vaccine MCR (86578) Pneumococcal Vaccine (46962) Admin 1st Vaccine (95284)  Patient Instructions: 1)  Schedule return appointment in January 2)  Keep appointment  with Dr Edwyna Shell 3)  Flu vax 4)  Pneumovax Prescriptions: PROAIR HFA 108 (90 BASE) MCG/ACT AERS (ALBUTEROL SULFATE) 2 puffs four times a day as needed rescue  #1 x prn   Entered and Authorized by:   Waymon Budge MD   Signed by:   Waymon Budge MD on 04/30/2010   Method used:    Print then Give to Patient   RxID:   [id number]    Immunizations Administered:  Influenza Vaccine # 1:    Vaccine Type: Fluvax MCR    Site: left deltoid    Mfr: FLUARIX    Dose: 0.5 ml    Route: IM    Given by: Reynaldo Minium CMA    Exp. Date: 12/19/2010    Lot #: [id number]    VIS given: 01/13/10 version given April 30, 2010.  Pneumonia Vaccine:    Vaccine Type: Pneumovax (Medicare)    Site: right deltoid    Mfr: Wyeth    Dose: 0.5 ml    Route: IM    Given by: Reynaldo Minium CMA    Exp. Date: 10/16/2011    Lot #: [id number]    VIS given: 05/26/09 version given April 30, 2010.  Flu Vaccine Consent Questions:    Do you have a history of severe allergic reactions to this vaccine? no    Any prior history of allergic reactions to egg and/or gelatin? no  Do you have a sensitivity to the preservative Thimersol? no    Do you have a past history of Guillan-Barre Syndrome? no    Do you currently have an acute febrile illness? no    Have you ever had a severe reaction to latex? no    Vaccine information given and explained to patient? yes    Are you currently pregnant? no   Immunizations Administered:  Influenza Vaccine # 1:    Vaccine Type: Fluvax MCR    Site: left deltoid    Mfr: FLUARIX    Dose: 0.5 ml    Route: IM    Given by: Reynaldo Minium CMA    Exp. Date: 12/19/2010    Lot #: [id number]    VIS given: 01/13/10 version given April 30, 2010.  Pneumonia Vaccine:    Vaccine Type: Pneumovax (Medicare)    Site: right deltoid    Mfr: Wyeth    Dose: 0.5 ml    Route: IM    Given by: Reynaldo Minium CMA    Exp. Date: 10/16/2011    Lot #: [id number]    VIS given: 05/26/09 version given April 30, 2010.

## 2010-07-21 NOTE — Medication Information (Signed)
Summary: [facility]ulizer[facility]  [facility]ul[facility]   Imported By: Sherian Rein 04/16/2010 08:33:39  _____________________________________________________________________  External Attachment:    Type:   Image     Comment:   External Document

## 2010-07-21 NOTE — Progress Notes (Signed)
Summary: fax request  Phone Note Call from Patient Call back at Home Phone [phone]   Caller: Patient Call For: young Summary of Call: please fax cxr results to dr Benedetto Goad at [facility] in [place] per pt request.  Initial call taken by: Tivis Ringer, CNA,  August 04, 2009 4:59 PM  Follow-up for Phone Call        cxr report faxed to Dr Benedetto Goad via [facility]. Follow-up by: Vernie Murders,  August 04, 2009 5:11 PM

## 2010-07-21 NOTE — Letter (Signed)
Summary: [facility]  [facility]   Imported By: Lester Farmington 05/18/2010 09:38:34  _____________________________________________________________________  External Attachment:    Type:   Image     Comment:   External Document

## 2010-07-21 NOTE — Assessment & Plan Note (Signed)
Summary: rov 2 months///kp   Primary Provider/Referring Provider:  Erenest Rasher - [place]   History of Present Illness: 01/15/09- COPD, chronic respiratory failure, hx colon cancer................sister here Says difficult breathing in summer, associated with panic attacks. She and family think she panics and is secondarily short of breath, not that she has spells of worse breathing which trigger her panic. It seems to help to take a neb treatment driven by her oxygen. otherwise uses her home O2 at 3 L/M Discussed neb med, nerves. Contiues prednisone 2 x 5mg  daily. Little phlegm, occasional cough. Denies chest pain, palpitation.  May 09, 2009- COPD. chronic respiratory failure, hx colon cancer.....................daughters Had flu and pneumovax. Some indigestion relieved by Rolaids and triggered by meals. Limited activity - dyspneic to mailbox. Did Pulmonary Rehab. "That's when I ended up in hospital". Continuous oxygen at 3 Not taking Restoril- not helpful. using more than 3 mg Xanax per day. Discussed alternatives. She was worried she was retaining fluid and hoped I would give diuretic.  July 21, 2009- COPD, chronic respirtatory failure, hx colon cancer.....................daughters here She had rash on right palm, then residual pins and needles Right palm. Dr Andrey Campanile speculated shingles and gave cortisone. Has finished burst and back to maintenance prednisone. She says pain is the problem now, since he saw here 2 weeks ago. Chest is unchanged, keeping cough productive of white mucus when she takes Neb. She stays on oxygen.  Current Medications (verified): 1)  Theochron 200 Mg  Tb12 (Theophylline) .... Take 1 By Mouth Two Times A Day With Food 2)  Prednisone 5 Mg  Tabs (Prednisone) .Marland Kitchen.. 1-2 Daily 3)  Alprazolam 1 Mg Tabs (Alprazolam) .... 1/2- 1 Three Times A Day As Needed 4)  Oxygen 2 L/m Nsleep and Prn 5)  Brovana 15 Mcg/77ml Nebu (Arformoterol Tartrate) .Marland Kitchen.. 1 Vial Via [employer] Two  Times A Day 6)  Restoril 15 Mg Caps (Temazepam) .... Take 1 Tab By Mouth At Bedtime As Needed 7)  Proair Hfa 108 (90 Base) Mcg/act Aers (Albuterol Sulfate) .... 2 Puffs Four Times A Day As Needed Rescue 8)  Albuterol Sulfate (2.5 Mg/47ml) 0.083% Nebu (Albuterol Sulfate) .Marland Kitchen.. 1 Vial in Nebulizer 4 Times Per Day As Needed 9)  Budesonide 0.5 Mg/105ml Susp (Budesonide) .... Take 1 By Mouth Two Times A Day 10)  Ipratropium Bromide 0.03 % Soln (Ipratropium Bromide) .... Take Four Times A Day  Allergies (verified): 1)  Morphine  Past History:  Past Medical History: Last updated: 05/09/2009 COPD Hosp 10/9-22/09- Acute on chronic resp failure/ vent/ permissive hypercapnea HX colon cancer Anxiety Insomnia  Past Surgical History: Last updated: January 23, 2008 Colon cancer surgery  Family History: Last updated: January 23, 2008 Mother - COPD Father - died of MI  Social History: Last updated: 23-Jan-2008 Lives with daughter retired  Risk Factors: Smoking Status: quit (01/23/2008) Passive Smoke Exposure: yes (2008/01/23)  Review of Systems      See HPI       The patient complains of dyspnea on exertion and prolonged cough.  The patient denies anorexia, fever, weight loss, weight gain, vision loss, decreased hearing, hoarseness, chest pain, syncope, peripheral edema, headaches, hemoptysis, abdominal pain, and severe indigestion/heartburn.    Vital Signs:  Patient profile:   70 year old female Height:      66 inches O2 Sat:      90 % on 3 L/min Pulse rate:   92 / minute BP sitting:   124 / 66  (left arm) Cuff size:   regular  Vitals  Entered By: Reynaldo Minium CMA (July 21, 2009 10:48 AM)  O2 Flow:  3 L/min  Physical Exam  Additional Exam:  General: A/Ox3; pleasant and cooperative, NAD, supplemental O2  87% 3L sat, wheelchair SKIN: no rash, lesions NODES: no lymphadenopathy HEENT: [place]/AT, EOM- WNL, Conjuctivae- clear, PERRLA, TM-WNL, Nose- clear, Throat- clear and wnl, [place]  II NECK: Supple w/ fair ROM, JVD- none, normal carotid impulses w/o bruits  CHEST: Poor airflow,  mild, diffuse, pursed lips, loose rattle HEART: RRR, no m/g/r heard ABDOMEN: Soft  HKV:QQVZ, nl pulses, no edema . No visible rash. She complains constantly of pain in her hand, moving the arm and fingers normally. No bruit heard. NEURO: Grossly intact to observation      Impression & Recommendations:  Problem # 1:  CHRONIC RESPIRATORY FAILURE (ICD-518.83)  Severe but baseline COPD with chronic bronchits. We are refilling Xanax for chronic anxiety We will give temporary script for Tylenol # 3 but I asked her to follow up on the arm pain with Dr Andrey Campanile. i will do CXR today to r/o obvious thoracic lesion affecting brachial plexyus. We are reilling magic mouthwash for intermittent thrush.  Medications Added to Medication List This Visit: 1)  Budesonide 0.5 Mg/53ml Susp (Budesonide) .... Take 1 by mouth two times a day 2)  Ipratropium Bromide 0.03 % Soln (Ipratropium bromide) .... Take four times a day 3)  Duke's Magic Mouthwash  .... 1 teaspoon swish and swallow four times a day for thrush 4)  Acetaminophen-codeine #3 300-30 Mg Tabs (Acetaminophen-codeine) .Marland Kitchen.. 1 or 2 four times a day as needed pain  Other Orders: Est. Patient Level III (99213) T-2 View CXR (71020TC)  Patient Instructions: 1)  Please schedule a follow-up appointment in 4 months. 2)  I have written a one-time script for pain medicine, but I won't refill it. You need to follow up on your arm problem with Dr Andrey Campanile. 3)  Alprazolam refilled. 4)  Mouthwash refilled 5)  A chest x-ray has been recommended.  Your imaging study may require preauthorization. >>>>>>>>>>>>>>>>>>>>>>>> 6)  Clinical Data: Right arm pain. 7)    8)  CHEST - 2 VIEW 9)    10)  Comparison: 04/08/2009 11)    12)  Findings: Trachea is midline.  Heart size normal.  Emphysema. 13)  Biapical pleural parenchymal scarring appears unchanged from 14)   12/29/2007.  Mild bibasilar scarring, also unchanged.  No pleural 15)  fluid. 16)    17)  IMPRESSION: 18)  Emphysema with biapical and bibasilar scarring. 19)    20)  Read By:  Reyes Ivan.,  M.D.     21)  Released By:  Reyes Ivan.,  M.D. Prescriptions: ALPRAZOLAM 1 MG TABS (ALPRAZOLAM) 1/2- 1 three times a day as needed  #90 x 5   Entered and Authorized by:   Waymon Budge MD   Signed by:   Waymon Budge MD on 07/21/2009   Method used:   Print then Give to Patient   RxID:   [id number] ACETAMINOPHEN-CODEINE #3 300-30 MG TABS (ACETAMINOPHEN-CODEINE) 1 or 2 four times a day as needed pain  #35 x 0   Entered and Authorized by:   Waymon Budge MD   Signed by:   Waymon Budge MD on 07/21/2009   Method used:   Print then Give to Patient   RxID:   [id number] DUKE'S MAGIC MOUTHWASH 1 teaspoon swish and swallow four times a day for thrush  #200 ml x 1  Entered and Authorized by:   Waymon Budge MD   Signed by:   Waymon Budge MD on 07/21/2009   Method used:   Print then Give to Patient   RxID:   [id number]

## 2010-07-21 NOTE — Assessment & Plan Note (Signed)
Summary: overdue for rov//lmr   Primary Provider/Referring Provider:  Erenest Rasher - [place]  CC:  Follow up needs srefdill on alprazolam and sob with exertion.  History of Present Illness: May 09, 2009- COPD. chronic respiratory failure, hx colon cancer.....................daughters Had flu and pneumovax. Some indigestion relieved by Rolaids and triggered by meals. Limited activity - dyspneic to mailbox. Did Pulmonary Rehab. "That's when I ended up in hospital". Continuous oxygen at 3 Not taking Restoril- not helpful. using more than 3 mg Xanax per day. Discussed alternatives. She was worried she was retaining fluid and hoped I would give diuretic.  July 21, 2009- COPD, chronic respirtatory failure, hx colon cancer.....................daughters here She had rash on right palm, then residual pins and needles Right palm. Dr Andrey Campanile speculated shingles and gave cortisone. Has finished burst and back to maintenance prednisone. She says pain is the problem now, since he saw here 2 weeks ago. Chest is unchanged, keeping cough productive of white mucus when she takes Neb. She stays on oxygen.  December 30, 2009- COPD, chronic respiratory failure, hx colon cancer.....................family here She coughs productively only when she uses her neb. Staying in mostly. Oxygen stays at 3L.The heat outdoors is drawing her energy, but nothing seems really new. Discussed med refills. She is curious about going back to [facility] and I am giving her the number so she can call..   Preventive Screening-Counseling & Management  Alcohol-Tobacco     Smoking Status: quit > 6 months  Allergies: 1)  Morphine  Past History:  Past Medical History: Last updated: 05/09/2009 COPD Hosp 10/9-22/09- Acute on chronic resp failure/ vent/ permissive hypercapnea HX colon cancer Anxiety Insomnia  Past Surgical History: Last updated: 01/14/2008 Colon cancer surgery  Family History: Last updated:  January 14, 2008 Mother - COPD Father - died of MI  Social History: Last updated: 12/30/2009 Lives with daughter retired Patient states former smoker.   Risk Factors: Smoking Status: quit > 6 months (12/30/2009) Passive Smoke Exposure: yes (01/14/2008)  Social History: Lives with daughter retired Patient states former smoker.  Smoking Status:  quit > 6 months  Review of Systems      See HPI       The patient complains of shortness of breath with activity, shortness of breath at rest, and productive cough.  The patient denies non-productive cough, coughing up blood, chest pain, irregular heartbeats, acid heartburn, indigestion, loss of appetite, weight change, abdominal pain, difficulty swallowing, sore throat, tooth/dental problems, headaches, nasal congestion/difficulty breathing through nose, and sneezing.    Vital Signs:  Patient profile:   70 year old female Height:      66 inches Weight:      194.6 pounds BMI:     31.52 O2 Sat:      90 % on 3 L/min Pulse rate:   87 / minute BP sitting:   108 / 58  (right arm)  Vitals Entered By: Renold Genta RCP, LPN (December 30, 2009 10:01 AM)  O2 Sat at Rest %:  90% O2 Flow:  3 L/min CC: Follow up needs srefdill on alprazolam, sob with exertion Comments Medications reviewed with patient Renold Genta RCP, LPN  December 30, 2009 10:10 AM    Physical Exam  Additional Exam:  General: A/Ox3; pleasant and cooperative, NAD, supplemental O2  87% 3L sat, wheelchair SKIN: no rash, lesions NODES: no lymphadenopathy HEENT: [place]/AT, EOM- WNL, Conjuctivae- clear, PERRLA, TM-WNL, Nose- clear, Throat- clear and wnl, Mallampti  II NECK: Supple w/ fair ROM, JVD- none,  normal carotid impulses w/o bruits  CHEST: Poor airflow, very distant HEART: RRR, no m/g/r heard ABDOMEN: Soft  GNF:AOZH, nl pulses, no edema . No visible rash. . No bruit heard. NEURO: Grossly intact to observation      Impression & Recommendations:  Problem # 1:  CHRONIC  RESPIRATORY FAILURE (ICD-518.83) Severe tobacco induced chronic lung disease. Stable now, remaining oxygen dependent.  Problem # 2:  COPD (ICD-496) Severe emphysema, but basically she is holding her own and needing med refills. I discussed return to Pulmonary Rehab, which would be good for her. I will see if she follows up.  Problem # 3:  THRUSH (ICD-112.0) Occasional thush- asks med to keep on hand.  Medications Added to Medication List This Visit: 1)  Duke's Magic Mouthwash  .... 1 teaspoon swish and spit four times a day  Other Orders: Est. Patient Level IV (08657)  Patient Instructions: 1)  Please schedule a follow-up appointment in 6 months. 2)  Scripts to be sent to drug store 3)  [phone]   Call and ask about restarting Pulmonary Rehab Prescriptions: DUKE'S MAGIC MOUTHWASH 1 teaspoon swish and spit four times a day  #150 ml x 3   Entered and Authorized by:   Waymon Budge MD   Signed by:   Waymon Budge MD on 12/30/2009   Method used:   Print then Give to Patient   RxID:   [id number] ALPRAZOLAM 1 MG TABS (ALPRAZOLAM) 1/2- 1 three times a day as needed  #90 x 5   Entered and Authorized by:   Waymon Budge MD   Signed by:   Waymon Budge MD on 12/30/2009   Method used:   Print then Give to Patient   RxID:   [id number] PREDNISONE 5 MG  TABS (PREDNISONE) 1-2 daily  #60 Tablet x 5   Entered and Authorized by:   Waymon Budge MD   Signed by:   Waymon Budge MD on 12/30/2009   Method used:   Print then Give to Patient   RxID:   [id number]

## 2010-07-23 NOTE — Progress Notes (Signed)
Summary: medication form faxed to our office  Phone Note From Other Clinic   Caller: Mary with [facility] Call For: Dr. Maple Hudson Summary of Call: Mary with [facility] phoned regarding Ms. Wahler she just sent a fax for the patients Budesonide generic they have been shipping the .25 mg and escript changing the prescription for .5 mg. She needs the form that she just faxed signed and returned. Corrie Dandy can be reached [phone] ext [phone] and the fax [phone] Initial call taken by: Vedia Coffer,  June 18, 2010 4:08 PM  Follow-up for Phone Call        form received and placed on CY look-at to sign. Mary with [facility] is aware. Carron Curie CMA  June 18, 2010 5:13 PM  per 1.5.12 phone note, pt is aware the form is pending.  pt verbalized her understanding. Boone Master CNA/MA  June 25, 2010 5:24 PM   form was faxed this AM.Jennifer Yancey Flemings CMA  June 26, 2010 5:22 PM

## 2010-07-23 NOTE — Progress Notes (Signed)
Summary: samples  Phone Note Call from Patient Call back at Home Phone [phone]   Caller: Patient Call For: Dr. Maple Hudson Summary of Call: patient phoned stated that she spoke a nurse and was told that they would leave her a sample of Brovana at the front if she had someone that could come and pick it up for her and she does. She can be reached [phone] Initial call taken by: Vedia Coffer,  June 26, 2010 1:46 PM  Follow-up for Phone Call        Spoke with pt and advised samples of brovana left up front for pick up.  Follow-up by: Vernie Murders,  June 26, 2010 3:35 PM

## 2010-07-23 NOTE — Medication Information (Signed)
Summary: [facility]  [facility]   Imported By: Lester Fontanelle 07/14/2010 12:10:07  _____________________________________________________________________  External Attachment:    Type:   Image     Comment:   External Document

## 2010-07-23 NOTE — Progress Notes (Signed)
Summary: form being faxed back for date correction  Phone Note From Pharmacy   Caller: patricia w/ [facility] Call For: young  Summary of Call: re: budesonide. caller says they did receive fax for this but the start date needs to read: 06/16/10. she is faxing this back for signature and needs this back asap as pt has been out of this for a while now. patricia contact # is [phone] Initial call taken by: Tivis Ringer, CNA,  June 26, 2010 10:57 AM  Follow-up for Phone Call        I LMTCBx1 with patrica at [facility] to clarify what exactly is needed fro the pt. I have several faxes that we received for this pt. Per Florentina Addison she also sent soem forms down to scan this AM in regards to this pt as well. I have called them and they are scanning in forms now. Carron Curie CMA  June 26, 2010 11:37 AM  So I spoke to [facility] and what the issue is is the form for albuterol initially had the wrong quanity on it so they have faxed the corrected form and they need this to be signed. The quanity should be #30. I have given form to [facility] to have CY sign. Carron Curie CMA  June 26, 2010 12:00 PM   Additional Follow-up for Phone Call Additional follow up Details #1::        This has been corrected and refaxed by Victorino Dike C.Katie [facility] CMA  June 26, 2010 5:31 PM

## 2010-07-23 NOTE — Progress Notes (Signed)
Summary: budesonide and ipratropium refills  Phone Note Call from Patient Call back at Home Phone [phone]   Caller: Patient Call For: Meredith [name] Summary of Call: pt wants med refills to [facility]. it was unclear when speaking to her what "exacly she wanted refilled as the brovana had been taken care of (but she didn't seem to agree with that).  Initial call taken by: Tivis Ringer, CNA,  June 25, 2010 3:49 PM  Follow-up for Phone Call        called spoke with patient who states she received notice from [facility] that she will need refills on her borvana, budesonide and ipratropium next and requests this be taken care of.  per CDY's last ov note in 04/2010, pt was to follow up in Jan.  no pending appts.  pt declined appt at this time.  i informed her that we can refill her meds this time, but for more refills she needs to follow up here at the office.  pt verbalized her understanding.  per 12.29.11 phone note, a fax was received from [facility] requesting documentation to support need for brovana.  the fax has been received but not taken care of.  will send refills on the ipratropium and budesonide.   Follow-up by: Boone Master CNA/MA,  June 25, 2010 5:20 PM    Prescriptions: IPRATROPIUM BROMIDE 0.03 % SOLN (IPRATROPIUM BROMIDE) take four times a day  #120 x 1   Entered by:   Boone Master CNA/MA   Authorized by:   Waymon Budge MD   Signed by:   Boone Master CNA/MA on 06/25/2010   Method used:   Electronically to        [facility]* (retail)       [address] [place], [place]  [address]       Ph: [phone]       Fax: [phone]   RxID:   [id number] BUDESONIDE 0.5 MG/2ML SUSP (BUDESONIDE) take 1 by mouth two times a day  #60 x 1   Entered by:   Boone Master CNA/MA   Authorized by:   Waymon Budge MD   Signed by:   Boone Master CNA/MA on 06/25/2010   Method used:   Electronically to        [facility]* (retail)  [address] [place], [place]  [address]       Ph: [phone]       Fax: [phone]   RxID:   [id number]

## 2010-07-23 NOTE — Progress Notes (Signed)
Summary: Rx faxed to pharmacy  Phone Note Call from Patient Call back at Home Phone [phone]   Caller: Patient Call For: young Summary of Call: pt says she "just talked to someone at [facility]" and was told that they have not received order for brovana even though this was sent electronically yesterday. pt has been out of this since last friday.  Initial call taken by: Tivis Ringer, CNA,  June 24, 2010 5:04 PM    Prescriptions: BROVANA 15 MCG/2ML NEBU (ARFORMOTEROL TARTRATE) 1 vial via neb two times a day  #60 x 5   Entered by:   Renold Genta RCP, LPN   Authorized by:   Waymon Budge MD   Signed by:   Renold Genta RCP, LPN on 13/01/6577   Method used:   Print then Give to Patient   RxID:   [id number]  Rx faxed to [facility], LPN  June 24, 2010 5:24 PM    Appended Document: Rx faxed to pharmacy Rx [facility]ed to [facility] pt is aware offered her samples but unable to come in.

## 2010-07-23 NOTE — Progress Notes (Signed)
Summary: out of brovana  Phone Note Call from Patient Call back at Home Phone [phone]   Caller: Patient Call For: young Summary of Call: pt has been out of brovana since last fri. she says [facility]  has faxed a request for this.  Initial call taken by: Tivis Ringer, CNA,  June 23, 2010 2:12 PM  Follow-up for Phone Call        Msg was left for pt to be aware that brovana was sent to [facility]. Follow-up by: Vernie Murders,  June 23, 2010 4:28 PM    Prescriptions: BROVANA 15 MCG/2ML NEBU (ARFORMOTEROL TARTRATE) 1 vial via neb two times a day  #60 x 5   Entered by:   Vernie Murders   Authorized by:   Waymon Budge MD   Signed by:   Vernie Murders on 06/23/2010   Method used:   Electronically to        [facility]* (retail)       [address] [place], [place]  [address]       Ph: [phone]       Fax: [phone]   RxID:   [id number]

## 2010-07-23 NOTE — Medication Information (Signed)
Summary: Budesonide & Brovana/[facility]  Budesonide & Brovana/[facility]   Imported By: Lanelle Bal 06/26/2010 11:45:37  _____________________________________________________________________  External Attachment:    Type:   Image     Comment:   External Document

## 2010-07-23 NOTE — Medication Information (Signed)
Summary: Ipratropium & Albuterol / [facility]  Ipratropium & Albuterol / [facility]   Imported By: Lennie Odor 07/06/2010 10:26:13  _____________________________________________________________________  External Attachment:    Type:   Image     Comment:   External Document

## 2010-07-23 NOTE — Medication Information (Signed)
Summary: Ipratropium & Albuterol/[facility]  Ipratropium & Albuterol/[facility]   Imported By: Sherian Rein 06/17/2010 10:03:48  _____________________________________________________________________  External Attachment:    Type:   Image     Comment:   External Document

## 2010-07-23 NOTE — Medication Information (Signed)
Summary: Nebulizer Meds & Supplies / [facility]  Nebulizer Meds & Supplies / [facility]   Imported By: Lennie Odor 06/30/2010 15:09:05  _____________________________________________________________________  External Attachment:    Type:   Image     Comment:   External Document

## 2010-08-19 ENCOUNTER — Telehealth (INDEPENDENT_AMBULATORY_CARE_PROVIDER_SITE_OTHER): Payer: Self-pay | Admitting: *Deleted

## 2010-08-20 ENCOUNTER — Telehealth (INDEPENDENT_AMBULATORY_CARE_PROVIDER_SITE_OTHER): Payer: Self-pay | Admitting: *Deleted

## 2010-08-27 NOTE — Progress Notes (Signed)
Summary: appt-ATC call will not go through  Phone Note Call from Patient Call back at Home Phone [phone]   Caller: Patient Call For: young Summary of Call: Wants appt next week pls advise. Initial call taken by: Darletta Moll,  August 20, 2010 8:14 AM  Follow-up for Phone Call        ATC pt.  Msg states "call will not go through". Will try again later Vernie Murders  August 20, 2010 9:54 AM  Spoke with pt.  She states that she is overduer for followup and wanted to go ahead and sched appt with Dr Maple Hudson.  Appt sched for 10/01/10 at 10:45 am.  Advised to call if has any issues in the meantime.  Follow-up by: Vernie Murders,  August 20, 2010 3:06 PM

## 2010-08-27 NOTE — Progress Notes (Signed)
Summary: cold > zpak  Phone Note Call from Patient Call back at Home Phone [phone]   Caller: Patient Call For: young Summary of Call: Pt c/o cold x2 days wants something called in pls advise.//[facility] [place] Initial call taken by: Darletta Moll,  August 19, 2010 10:33 AM  Follow-up for Phone Call        Pt c/o productive cough with green mucus starting yesterday. Pt denies chest pain, bodyaches, fever, wheezing, loss of appetite, n/v/d, PND, or sore throat. Pt requesting medication to be called in. Please advise. Thanks. Zackery Barefoot CMA  August 19, 2010 12:17 PM   Medication Allergies: Morphine  Additional Follow-up for Phone Call Additional follow up Details #1::        pt called back again. wants call back asap. Tivis Ringer, CNA  August 19, 2010 3:14 PM  CDY not in office this afternoon.  called spoke with patient, made her aware of this and that her message is being forwarded to the doctor on call.  pt verbalized her understanding.  please advise, thanks. Boone Master CNA/MA  August 19, 2010 4:10 PM     Additional Follow-up for Phone Call Additional follow up Details #2::    CDY back in office.   per CDY: okay for zpak #1 take as directed, no refills.  called spoke with patient, advised of CDY's recs as stated above.  pt verbalized her understanding.  rx sent to [facility]. Boone Master CNA/MA  August 19, 2010 4:49 PM   New/Updated Medications: ZITHROMAX Z-PAK 250 MG TABS (AZITHROMYCIN) take as directed Prescriptions: ZITHROMAX Z-PAK 250 MG TABS (AZITHROMYCIN) take as directed  #1 x 0   Entered by:   Boone Master CNA/MA   Authorized by:   Waymon Budge MD   Signed by:   Boone Master CNA/MA on 08/19/2010   Method used:   [facility]ronically to        [facility] [address] (retail)       [address]       [place], [place]  [address]       Ph: [phone] or [phone]       Fax: [phone]   RxID:   [id number]

## 2010-09-01 LAB — BASIC METABOLIC PANEL
CO2: 35 mEq/L — ABNORMAL HIGH (ref 19–32)
Calcium: 9.2 mg/dL (ref 8.4–10.5)
Creatinine, Ser: 0.91 mg/dL (ref 0.4–1.2)
GFR calc non Af Amer: >60 mL/min (ref >60)
Glucose, Bld: 147 mg/dL — ABNORMAL HIGH (ref 70–99)
Sodium: 139 mEq/L (ref 135–145)

## 2010-09-01 LAB — CBC
Hemoglobin: 12.2 g/dL (ref 12.0–15.0)
Hemoglobin: 14.6 g/dL (ref 12.0–15.0)
MCH: 30 pg (ref 26.0–34.0)
MCH: 31.3 pg (ref 26.0–34.0)
MCHC: 31.6 g/dL (ref 30.0–36.0)
MCHC: 33.3 g/dL (ref 30.0–36.0)
MCV: 93.8 fL (ref 78.0–100.0)
Platelets: 246 10*3/uL (ref 150–400)
RBC: 4.67 MIL/uL (ref 3.87–5.11)
RDW: 12.6 % (ref 11.5–15.5)
WBC: 10.7 10*3/uL — ABNORMAL HIGH (ref 4.0–10.5)

## 2010-09-01 LAB — COMPREHENSIVE METABOLIC PANEL
BUN: 8 mg/dL (ref 6–23)
CO2: 33 mEq/L — ABNORMAL HIGH (ref 19–32)
Calcium: 10.1 mg/dL (ref 8.4–10.5)
Chloride: 95 mEq/L — ABNORMAL LOW (ref 96–112)
Creatinine, Ser: 0.99 mg/dL (ref 0.4–1.2)
GFR calc non Af Amer: 56 mL/min — ABNORMAL LOW (ref >60)
Glucose, Bld: 117 mg/dL — ABNORMAL HIGH (ref 70–99)
Total Bilirubin: 1 mg/dL (ref 0.3–1.2)

## 2010-09-01 LAB — URINALYSIS, ROUTINE W REFLEX MICROSCOPIC
Bilirubin Urine: NEGATIVE
Ketones, ur: NEGATIVE mg/dL
Nitrite: NEGATIVE
Protein, ur: NEGATIVE mg/dL
pH: 6.5 (ref 5.0–8.0)

## 2010-09-01 LAB — BLOOD GAS, ARTERIAL
Drawn by: 33098
FIO2: 0.21 %
O2 Saturation: 96.1 %
Patient temperature: 98.6
pH, Arterial: 7.31 — ABNORMAL LOW (ref 7.350–7.400)

## 2010-09-01 LAB — SURGICAL PCR SCREEN: Staphylococcus aureus: POSITIVE — AB

## 2010-09-01 LAB — APTT: aPTT: 27 seconds (ref 24–37)

## 2010-09-01 LAB — TYPE AND SCREEN
ABO/RH(D): B POS
Antibody Screen: NEGATIVE

## 2010-09-01 LAB — PROTIME-INR: Prothrombin Time: 13.2 seconds (ref 11.6–15.2)

## 2010-09-01 LAB — URINE MICROSCOPIC-ADD ON

## 2010-09-13 ENCOUNTER — Other Ambulatory Visit: Payer: Self-pay | Admitting: Internal Medicine

## 2010-09-24 LAB — BLOOD GAS, ARTERIAL
Acid-Base Excess: 6.1 mmol/L — ABNORMAL HIGH (ref 0.0–2.0)
Acid-Base Excess: 6.8 mmol/L — ABNORMAL HIGH (ref 0.0–2.0)
Bicarbonate: 36.5 mEq/L — ABNORMAL HIGH (ref 20.0–24.0)
O2 Saturation: 91 %
O2 Saturation: 94.9 %
Patient temperature: 98.6
TCO2: 30 mmol/L (ref 0–100)
TCO2: 33.4 mmol/L (ref 0–100)
pCO2 arterial: 62.2 mmHg (ref 35.0–45.0)
pH, Arterial: 7.273 — ABNORMAL LOW (ref 7.350–7.400)
pO2, Arterial: 70.8 mmHg — ABNORMAL LOW (ref 80.0–100.0)

## 2010-09-24 LAB — DIFFERENTIAL
Basophils Absolute: 0.1 10*3/uL (ref 0.0–0.1)
Eosinophils Absolute: 0.2 10*3/uL (ref 0.0–0.7)
Eosinophils Relative: 1 % (ref 0–5)
Lymphocytes Relative: 10 % — ABNORMAL LOW (ref 12–46)
Monocytes Absolute: 0.6 10*3/uL (ref 0.1–1.0)

## 2010-09-24 LAB — CBC
HCT: 41.3 % (ref 36.0–46.0)
Hemoglobin: 13.6 g/dL (ref 12.0–15.0)
MCV: 95.8 fL (ref 78.0–100.0)
RDW: 13.2 % (ref 11.5–15.5)

## 2010-09-24 LAB — POCT I-STAT, CHEM 8
BUN: 20 mg/dL (ref 6–23)
Creatinine, Ser: 0.9 mg/dL (ref 0.4–1.2)
Potassium: 4.5 mEq/L (ref 3.5–5.1)
Sodium: 138 mEq/L (ref 135–145)

## 2010-09-29 ENCOUNTER — Encounter: Payer: Self-pay | Admitting: Internal Medicine

## 2010-10-01 ENCOUNTER — Ambulatory Visit: Payer: Self-pay | Admitting: Internal Medicine

## 2010-10-06 LAB — BLOOD GAS, ARTERIAL
Bicarbonate: 34.1 mEq/L — ABNORMAL HIGH (ref 20.0–24.0)
pCO2 arterial: 75.8 mmHg (ref 35.0–45.0)
pH, Arterial: 7.275 — ABNORMAL LOW (ref 7.350–7.400)
pO2, Arterial: 69.2 mmHg — ABNORMAL LOW (ref 80.0–100.0)

## 2010-10-06 LAB — COMPREHENSIVE METABOLIC PANEL
ALT: 21 U/L (ref 0–35)
AST: 24 U/L (ref 0–37)
Albumin: 3.4 g/dL — ABNORMAL LOW (ref 3.5–5.2)
Alkaline Phosphatase: 45 U/L (ref 39–117)
BUN: 14 mg/dL (ref 6–23)
CO2: 41 mEq/L — ABNORMAL HIGH (ref 19–32)
Calcium: 9.4 mg/dL (ref 8.4–10.5)
Chloride: 94 mEq/L — ABNORMAL LOW (ref 96–112)
Creatinine, Ser: 0.8 mg/dL (ref 0.4–1.2)
GFR calc Af Amer: >60 mL/min (ref >60)
GFR calc non Af Amer: >60 mL/min (ref >60)
Glucose, Bld: 114 mg/dL — ABNORMAL HIGH (ref 70–99)
Potassium: 4.7 mEq/L (ref 3.5–5.1)
Sodium: 139 mEq/L (ref 135–145)
Total Bilirubin: 0.8 mg/dL (ref 0.3–1.2)
Total Protein: 6 g/dL (ref 6.0–8.3)

## 2010-10-06 LAB — CBC
Hemoglobin: 13.1 g/dL (ref 12.0–15.0)
RBC: 4.34 MIL/uL (ref 3.87–5.11)
RDW: 13.5 % (ref 11.5–15.5)
WBC: 12.8 10*3/uL — ABNORMAL HIGH (ref 4.0–10.5)

## 2010-10-06 LAB — DIFFERENTIAL
Basophils Absolute: 0.1 10*3/uL (ref 0.0–0.1)
Lymphocytes Relative: 10 % — ABNORMAL LOW (ref 12–46)
Monocytes Absolute: 0.7 10*3/uL (ref 0.1–1.0)
Monocytes Relative: 6 % (ref 3–12)
Neutro Abs: 10.2 10*3/uL — ABNORMAL HIGH (ref 1.7–7.7)

## 2010-10-06 LAB — CULTURE, RESPIRATORY W GRAM STAIN

## 2010-10-06 LAB — POCT I-STAT, CHEM 8
BUN: 14 mg/dL (ref 6–23)
Calcium, Ion: 1.3 mmol/L (ref 1.12–1.32)
Chloride: 94 mEq/L — ABNORMAL LOW (ref 96–112)

## 2010-10-06 LAB — EXPECTORATED SPUTUM ASSESSMENT W GRAM STAIN, RFLX TO RESP C

## 2010-10-06 LAB — EXPECTORATED SPUTUM ASSESSMENT W REFEX TO RESP CULTURE

## 2010-10-06 LAB — PROTIME-INR: Prothrombin Time: 14 seconds (ref 11.6–15.2)

## 2010-11-03 NOTE — H&P (Signed)
[name], Meredith                ACCOUNT NO.:  [id number]   MEDICAL RECORD NO.:  [id number]          PATIENT TYPE:  INP   LOCATION:  0106                         FACILITY:  [facility]   PHYSICIAN:  Clinton D. Maple Hudson, MD, FCCP, FACPDATE OF BIRTH:  05/12/1943   DATE OF ADMISSION:  07/22/2008  DATE OF DISCHARGE:                              HISTORY & PHYSICAL   PROBLEMS.:  1. Chronic obstructive pulmonary disease with acute exacerbation.  2. Acute and chronic hypercapnic respiratory failure with respiratory      acidosis.   HISTORY OF PRESENT ILLNESS:  This is 70 year old former smoker with  advanced COPD, presenting with chief complaint of shortness of breath.  She is interviewed with family at bedside.  She and family give history  that she has been significantly more short of breath primarily with  exertion over the last 2 or 3 weeks.  There has been no specific event,  and she denies chest pain, fever or discolored sputum.  She has been  more comfortable at rest and lying in bed.  Oxygen saturations have been  dipping with activity to as low as 70%-80%, but when she lies in bed,  she says saturations are in the 91%-95% range on 2-3 liters of oxygen.  She uses oxygen continuously and has been using her home nebulizer  machine on a regular basis while continuing prednisone 10 mg daily.  She  had made and cancelled two appointments with her primary office,  stalling until her scheduled appointment with me February 8.  Family  prevailed on her finally to call for help because of low oxygen  saturations and obvious dyspnea sufficient to prevent her from cooking  her meals.   REVIEW OF SYSTEMS:  Cough productive of white sputum.  Generalized  aching and stiffness she attributes to chronic arthritic discomfort.  She denies fever or chills, chest pain or palpitation, adenopathy or  rash, nausea or vomiting, change in bowel or bladder habit, dysuria or  other acute discomfort.   ALLERGIES:   MORPHINE.   HOME MEDICATIONS:  1. Theochron 200 mg b.i.d.  2. Prednisone 5 mg times 2 tablets daily.  3. Alprazolam 0.5 mg t.i.d. p.r.n.  4. Celexa 20 mg daily.  5. Oxygen at 2-3 liters continuous.  6. Spiriva once daily.  7. Nebulizer with Xopenex 1.25 mg t.i.d. p.r.n.  8. Restoril 15 mg h.s. p.r.n.  9. Advair 250/50 one b.i.d.   PAST MEDICAL HISTORY:  1. COPD.  2. Hospitalized October 2009 with COPD exacerbation requiring      ventilator support.  3. Colon cancer 2003 with partial resection and chemotherapy (Dr.      Pierce Crane).   PAST SURGICAL HISTORY:  1. Colon cancer resection.  2. Breast lump.   SOCIAL HISTORY:  Widowed, retired, lives with daughter.  Quit smoking  2003.   FAMILY HISTORY:  Positive for emphysema, heart disease and cancer.   ADDITIONAL LABORATORY HISTORY:  In September 2007, her FEV-1 was 0.99  liters (42% predicted) after bronchodilator.   OBJECTIVE:  VITAL SIGNS:  BP 163/87, pulse 112, respiration 22, 98%  saturation now on 3 liter prongs.  GENERAL APPEARANCE:  Alert and oriented, sitting semi upright and  conversant.  She recognized me.  SKIN:  Warm without rash or bruising.  ADENOPATHY:  None found at the neck, supraclavicular or axillary areas.  HEENT:  Dentures.  Oral mucosa clear.  No neck vein distention or  stridor.  Pupils reactive, conjunctivae clear.  CHEST:  Diffuse inspiratory and expiratory wheezing, somewhat obscuring  heart sounds.  HEART:  Rhythm is regular without murmur or gallop  audible.  PMI is diffuse.  ABDOMEN:  Soft, nontender.  Bowel sounds are  present.  BREASTS:  Pelvic and rectal not examined, noncontributory at this time.  EXTREMITIES:  Warm with no cyanosis or clubbing.  Dorsalis pedis pulses  present bilaterally.  She is moving all extremities.   LABORATORY:  Chest X-Ray: Shows COPD with basilar scarring, no acute  process.  WBC 12,800 with hemoglobin 13.1.   IMPRESSION:  Chronic obstructive pulmonary disease  exacerbation with  acute and chronic hypercapnic respiratory failure associated with  respiratory acidosis.   My particular concern is that she cancelled two office appointments  during this illness and is pressing now for discharge home.  I prevailed  upon her with family support to stay at the hospital, recognizing her  respiratory acidosis, diffuse wheezing and leukocytosis.  She will be  treated with intravenous steroids, nebulized bronchodilators and  antibiotic coverage.  Her EKG has an extremely noisy baseline.  When she  is settled, I will repeat that, but I am not suspecting an acute cardiac  event as part of the presentation.      Clinton D. Maple Hudson, MD, Tonny Bollman, FACP  Electronically Signed     CDY/MEDQ  D:  07/22/2008  T:  07/22/2008  Job:  [id number]   cc:   Marjory Lies, M.D.  Fax: [phone]

## 2010-11-03 NOTE — Discharge Summary (Signed)
Meredith [name], Meredith [name]                ACCOUNT NO.:  [id number]   MEDICAL RECORD NO.:  [id number]          PATIENT TYPE:  INP   LOCATION:  1503                         FACILITY:  [facility]   PHYSICIAN:  Clinton D. Maple Hudson, MD, FCCP, FACPDATE OF BIRTH:  [birth year] 1943   DATE OF ADMISSION:  03/29/2008  DATE OF DISCHARGE:  04/11/2008                               DISCHARGE SUMMARY   DISCHARGE DIAGNOSES:  1. Status post acute on chronic respiratory failure secondary to acute      exacerbation of chronic obstructive pulmonary disease.  2. Agitated delirium.  3. Hyperglycemia.  4. Protein calorie malnutrition.  5. Purulent bronchitis.  6. Resolve volume overload.   LABORATORY DATA:  April 09, 2008, white blood cell count 18.2,  hemoglobin 13.8.  Sodium 139, potassium 3.9, chloride 97, CO2 is 34, BUN  32, creatinine 1.02, glucose 87.  Laboratory culture negative,  bronchioalveolar lavage on March 29, 2008, negative.  Blood cultures on  March 29, 2008 negative.  Urine strep Legionella on March 29, 2008  negative.  Urine cultures on March 29, 2008 negative.   BRIEF HISTORY:  This is a 70 year old female patient who was admitted on  March 29, 2008, with acute exacerbation of chronic obstructive  pulmonary disease necessitating endotracheal intubation, and placement  on mechanical ventilator.  She was admitted by the pulmonary critical  care service for further evaluation and therapy.   HOSPITAL COURSE/DISCHARGE DIAGNOSES:  1. Acute on chronic respiratory failure secondary to acute      exacerbation of chronic obstructive pulmonary disease in the      setting of purulent bronchitis.  Meredith [name] was admitted to the      pulmonary critical care service following presentation to the      emergency room on March 29, 2008, with symptoms consistent with      acute exacerbation of chronic obstructive pulmonary disease.  She      had significant respiratory acidosis in the setting of marked air  trapping on the mechanical ventilator.  She required heavy      sedation, prolonged expiratory phase on the ventilator, as well as      permissive hypercapnia.  She slowly improved over the course of her      hospitalization requiring a prolonged course of systemic steroids,      bronchodilators, and did complete a 10-day course of empiric Avelox      for purulent bronchitis.  She did develop significant volume      overload during the hospitalization for which we began to      aggressively diurese her for.  An echocardiogram was obtained which      showed normal ejection fraction.  She was eventually able to      tolerate weaning trials, weaned oral steroids, and was eventually      liberated from the ventilator on April 07, 2008.  She continued      to improve following this extubation.  She was eventually      transitioned to oral prednisone with plans to taper, and back to  home bronchodilator regimen.  She was discharged to home by Dr.      Maple Hudson with follow-up planned for one week following release.  2. Agitation.  Secondary to toxic metabolic encephalopathy.  This was      treated in the inpatient setting with Seroquel and Xanax.  She      continued to improve over the course of her hospitalization and      eventually regained normal cognitive status and was successfully      discharged to home on maintenance Xanax.  3. Oral thrush.  This was following a prolonged antibiotic course and      she was discharged to home on [employer]'s Magic Mouthwash formula.      Finally, chronic anxiety for this.  She was discharged on Xanax.   DISPOSITION:  Meredith [name] met maximum benefit from inpatient care at the  completion of April 11, 2008, at which time she was discharged to  home.   DISCHARGE INSTRUCTIONS:  1. Diet as tolerated.  2. Follow-up Dr. Maple Hudson in 3 weeks following discharge.   DISCHARGE MEDICATIONS:  1. Duke's Magic Mouthwash 1 teaspoon swish as swallow four times a day       for five days.  2. Prednisone taper 30 mg x2 days, then 20 mg x2 days, then 10 mg x2      days, then 5 mg x2 days.  3. Xanax 0.5 mg tab one tablet three times a day as needed.  4. Celexa 40 mg daily.  5. Spiriva one cap inhaled daily.  6. Advair 100/50 one puff twice a day.  7. Xopenex 1.25 mg neb three times a day p.r.n.  8. ProAir inhaler 2 puffs every 4 hours as needed.  9. Theophylline 200 mg one tablet twice a day.  10.Oxygen 3 liters continuous.   DISPOSITION:  Meredith [name] was discharged to home in stable condition.      Zenia Resides, NP      Clinton D. Maple Hudson, MD, Tonny Bollman, FACP  Electronically Signed    PB/MEDQ  D:  04/23/2008  T:  04/23/2008  Job:  [id number]

## 2010-11-03 NOTE — Assessment & Plan Note (Signed)
[facility]                             PULMONARY OFFICE NOTE   NAME:Meredith [name], Meredith [name]                       MRN:          [id number]  DATE:11/23/2006                            DOB:          Mar 28, 1941    PROBLEMS:  1. Chronic obstructive pulmonary disease.  2. History of colon cancer with resection.   HISTORY:  Dr. Donnie Coffin has released her for a year. Theophylline level was  okay at last visit, 9.6 on 200 mg b.i.d. She is tolerating it well and  it seems to help her. She still has to work for her breathing some and  asks about maintenance prednisone. I discussed that very carefully with  her and answered her questions. She complains that nebulized albuterol  makes her too nervous and we discussed Xopenex.   MEDICATIONS:  1. Alprazolam 0.5 mg.  2. Advair 250/50.  3. Oxygen at night and p.r.n., 2 liters.  4. Theophylline 200 mg b.i.d.  5. Albuterol rescue inhaler.  6. Home nebulizer with DuoNeb.   Drug intolerant of MORPHINE.   OBJECTIVE:  Weight 184 pounds, blood pressure 138/80, pulse 87, room air  saturation 89%. Breath sounds are diminished. Respiratory phase is  regular without murmur. There is no adenopathy and no peripheral edema.  She keeps a mild chronic cough.   IMPRESSION:  Chronic obstructive pulmonary disease probably with an  asthma component.   PLAN:  1. Steroid talk.  2. Try maintenance prednisone using 10 mg daily initially, number 30,      refill twice.  3. Change albuterol to prescription Xopenex 1.25 mg daily.  4. Schedule return 2 months, earlier p.r.n.     Clinton D. Maple Hudson, MD, Tonny Bollman, FACP  Electronically Signed    CDY/MedQ  DD: 11/27/2006  DT: 11/28/2006  Job #: [id number]   cc:   Marjory Lies, M.D.  Pierce Crane, M.D.

## 2010-11-03 NOTE — Letter (Signed)
May 05, 2010   Anselm Pancoast. Zachery Dakins, MD  [address], [address]  [place], [place] [address]   Re:  Meredith [name], Meredith [name]                DOB:  Sep 12, 1940   Dear [name]:   I saw the patient today.  This 70 year old patient has been on chronic  oxygen for 4 or 5 years and has an enlarging lesion on the right neck.  A CT scan showed a cystic structure starting at the clavicle and  extending superiorly.  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  It has gradually  gotten bigger over the last several months.   MEDICATIONS:  Tylenol for headache, albuterol, alprazolam, citalopram,  fish oil, prednisone, triamcinolone, vitamin C and E, ProAir, Advair,  Theochron, Brovana, budesonide, Ventolin, and Restoril.   ALLERGIES:  Morphine causes hallucinations.   PAST MEDICAL HISTORY:  She has severe chronic obstructive pulmonary  disease and hypertension.   FAMILY HISTORY:  Positive for heart disease and lung disease.   SOCIAL HISTORY:  She is widowed, has 4 children.  She is retired.  Quit  smoking 2 years ago.  Does not drink alcohol on a regular basis.   REVIEW OF SYSTEMS:  VITAL SIGNS:  She is 192 pounds.  She is 5 feet 6  inches.  GENERAL:  Her weight has been stable.  She comes in on a wheelchair.  CARDIAC:  No angina or atrial fibrillation.  PULMONARY:  Home oxygen and bronchitis.  No hemoptysis.  GI:  No nausea, vomiting, constipation, or diarrhea.  GU:  No kidney disease, dysuria, or frequent urination.  VASCULAR:  No claudication, DVT, or TIAs.  NEUROLOGICAL:  No dizziness, headaches, blackouts, or seizures.  MUSCULOSKELETAL:  Arthritis.  PSYCHIATRIC:  Depression and nervousness.  EYE/ENT:  No changes in her eyesight or hearing.  HEMATOLOGICAL:  No problems with bleeding, clotting disorders, or  anemia.   PHYSICAL EXAMINATION:  General:  She is a slightly obese Caucasian  female, in no acute distress.  Neck:  Supple  without thyromegaly.  There  is no supraclavicular or axillary adenopathy.  Chest:  However in the  right chest, there is a large cystic structure of about 8-10 cm in size.  There is no pulsation and is very soft in nature.  This is the lesion  that was seen on the chest x-ray.  She has had no numbness in her arm,  no swelling in her right arm.  Lungs:  Increased distal breath sounds  and bilateral wheezes.  Heart:  Regular, sinus rhythm.  Abdomen:  Obese.  Bowel sounds normal.  Extremities:  Pulses are 1+.  There is 2+ edema  and no clubbing.  Neurological:  She is oriented x3.   I think this is a cystic structure in the right.  The remaining risk  factor will be general anesthesia, which will be required to excise  this, and we discussed the risk about it including bleeding, nerve  damage, and possibly being on a respirator for a short or long period of  time.  She agrees to proceed with surgery.  We plan to do this on  May 21, 2010, at [facility]   Ines Bloomer, M.D.  Electronically Signed   DPB/MEDQ  D:  05/05/2010  T:  05/06/2010  Job:  [id number]   cc:   Gloriajean Dell. Andrey Campanile, M.D.

## 2010-11-05 ENCOUNTER — Encounter: Payer: Self-pay | Admitting: Internal Medicine

## 2010-11-06 NOTE — Procedures (Signed)
[facility]  Patient:    Meredith [name], Meredith [name] Visit Number: [id number] MRN: [id number]          Service Type: END Location: ENDO Attending Physician:  Nelda Marseille Dictated by:   Petra Kuba, M.D. Proc. Date: 11/16/01 Admit Date:  11/16/2001   CC:         Meredith [name], M.D.  Meredith [name], M.D.   Procedure Report  PROCEDURE:  Colonoscopy with polypectomy.  INDICATION:  Patient with history of colon cancer, status post chemotherapy and surgery.  Consent was signed after risks, benefits, methods, and options thoroughly discussed in the office.  MEDICATIONS:  Demerol 70 mg, Versed 10 mg.  DESCRIPTION OF PROCEDURE:  Rectal inspection was pertinent for external hemorrhoids.  Digital exam was negative.  The video pediatric adjustable colonoscope was inserted and easily advanced around the colon to the anastomosis, which was identified by the small bowel side and the colon side. The end of the colon was tied off.  On insertion some scattered diverticula were seen but no other abnormalities.  The scope was inserted a short way into the terminal ileum, which was normal, through one of the limbs.  There seemed to be a much shorter blind pouch limb as well.  The TI was normal.  The scope was slowly withdrawn.  The prep was only fair, did require over a liter of fluid to wash and suction.  There was some stool adherent to the wall of the colon, which could not be washed off.  On slow withdrawal through the colon in the probable descending, a small polyp was seen and was hot biopsied x1.  No other polypoid lesions were seen; however, with the prep, small lesions may have been missed.  However, in the rectum on retroflexion, not only internal hemorrhoids were seen but two small polyps, one of which was hot biopsied and then snared, suctioned through the scope and collected in the trap.  The other other one was snared and suctioned through  the scope and collected in the trap.  They were put in a separate container.  Straight visualization of the rectum did not reveal any additional findings.  The scope was inserted a short way up the left side of the colon, air was suctioned, and scope removed.  The patient tolerated the procedure well.  There was no obvious immediate complication.  ENDOSCOPIC DIAGNOSES: 1. Internal-external hemorrhoids. 2. Left and right diverticula. 3. Three small polyps, hot biopsied in the descending, hot biopsied and snared    in the rectum and a third one in the rectum snared. 4. Otherwise within normal limits to the anastomosis and terminal ileum.  PLAN:  Await pathology to determine future colonic screening.  Probably would proceed a little sooner than usual based on her fair prep, probably use GoLYTELY the next time or an extra Fleets bottle.  Will return care to Dr. Catha [name] for customary health care maintenance, and happy to see back p.r.n.  dia Dictated by:   Petra Kuba, M.D. Attending Physician:  Nelda Marseille DD:  11/16/01 TD:  11/17/01 Job: [id number]

## 2010-11-06 NOTE — Discharge Summary (Signed)
Meredith [name], Meredith [name]                ACCOUNT NO.:  [id number]   MEDICAL RECORD NO.:  [id number]          PATIENT TYPE:  INP   LOCATION:  1309                         FACILITY:  [facility]   PHYSICIAN:  Clinton D. Maple Hudson, MD, FCCP, FACPDATE OF BIRTH:  1940/11/22   DATE OF ADMISSION:  07/22/2008  DATE OF DISCHARGE:  07/24/2008                               DISCHARGE SUMMARY   DISCHARGE DIAGNOSES:  1. Acute and chronic respiratory failure.  2. Chronic obstructive pulmonary disease with acute exacerbation.   BRIEF HISTORY:  A 70 year old former smoker, home oxygen-dependent with  severe COPD.  She presented reporting increased exertional dyspnea for 2  to 3 weeks prior to admission with oxygen saturations at home as low as  70% to 80% on her oxygen and maintenance prednisone 10 mg daily.  She  had been putting off visits to her primary office when family finally  pressured her to call for help because of low oxygen saturations and  increased dyspnea.  She reported cough productive of white sputum  without acute fever or chill, chest pain or palpitation.   PAST MEDICAL HISTORY:  Significant for:  1. COPD, previously requiring ventilator support in 2009.  2. Resection of colon cancer 2003 with chemotherapy.  3. She had had an FEV-1 in 2007, 0.99 liters, 42% of predicted.   OBJECTIVE:  Oxygen saturation on arrival was 98% on 3 liter prongs with  respirations 22, pulse of 112 and a BP 163/87.  There was diffuse  expiratory and expiratory wheezing.  HEART:  Sounds were regular.  No murmur or gallop could be heard over  her breath sounds.  There was no significant peripheral edema.   HOSPITAL COURSE:  She was begun with intravenous Solu-Medrol, nebulized  albuterol with ipratropium, antibiotic coverage with Rocephin and  Zithromax against acute bronchitis.  DVT prophylaxis by protocol.  She  was counseled on compliance with office followup.  A theophylline level  of 4.4 was obtained representing  maintenance dose of 200 mg b.i.d. and  this was discussed.  Smoking cessation was reviewed but she and family  deny that she has been smoking at home.  She improved steadily and  reached maximum hospital benefit.  Exam at discharge was significant for  saturation of 98% on 2 liter prongs, distinctly poor air flow with trace  wheeze in the bases and prolonged expiratory phase without accessory  muscle use.  Pulse was regular.  It was felt that she had had a  nonspecific exacerbation of COPD, possibly with a viral infection  component.   LABORATORY:  Arterial blood gas on 3 liter prongs on July 22, 2008,  showed pH 7.27, pCO2 75.8, pO2 69.2, bicarbonate 34.1.  WBC on admission  was 12,800, hemoglobin 13.1.  Initial neutrophil percentage 80.  Coagulation studies were normal.  She presented with a glucose of 141  falling to 114 with followup.  BUN was 14, creatinine 0.8, albumin 3.4  with normal liver enzymes.  Initial theophylline level was 4.4  reflecting theophylline dose of 200 mg b.i.d.  Sputum showed moderate  gram-positive cocci in pairs  and clusters but grew only rare Candida  albicans consistent with mouth contamination.  EKG showed junctional ST  depression, sinus tachycardia, left axis deviation.  Chest x-ray  portable technique on July 22, 2008, showed attenuation of vascular  markings in the upper lobe suggestive of emphysema, patchy increased  basilar opacity felt by radiology to reflect scarring.   DISCHARGE/PLANS:  She was discharged home on oxygen in company of  family.  She is to keep her follow-up appointment as scheduled with Dr.  Maple Hudson for pulmonary on February 8 and to see Dr. Doristine Counter at [facility] for primary care.   ACTIVITY:  To progress gradually.   DIET:  Heart healthy.   1. Spiriva one daily.  2. ProAir 2 puffs up to q.i.d. p.r.n. rescue inhaler.  3. Theochron 200 mg b.i.d.  4. Home nebulizer with albuterol 0.083% four times daily as  needed.  5. Advair 250/50 one puff and rinse b.i.d.  6. Xanax 0.5 mg t.i.d. p.r.n. anxiety.  7. Celexa 20 mg daily.  8. Temazepam 15 mg, 1 or 2 for sleep if needed.  9. Prednisone 10 mg tablets to take 40 mg x2 days, 30 mg x2 days, 20      mg x2 days then 10 mg daily.  10.Oxygen 2-3 liters per minute.      Clinton D. Maple Hudson, MD, Tonny Bollman, FACP  Electronically Signed     CDY/MEDQ  D:  08/30/2008  T:  08/31/2008  Job:  [id number]   cc:   Marjory Lies, M.D.  Fax: [phone]

## 2010-11-06 NOTE — Discharge Summary (Signed)
[facility]  Patient:    Meredith [name], Meredith [name]                       MRN: [id number] Adm. Date:  [id number] Disc. 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  PROCEDURE:  Right hemicolectomy with drainage of abdominal abscess on December 21, 2000.  HISTORY OF PRESENT ILLNESS:  Meredith [name] is a 70 year old, white female who presents to the [facility] Emergency Room with a five-day history of gradually worsening constant right lower quadrant abdominal pain.  The pain became severe enough today that she could not longer tolerate it.  She had been taking a friends prescription pain medication for several days.  She has had nausea and vomiting associated with this.  On questioning, she actually has had intermittent nausea and vomiting getting worse over a period of about one month and has had an associated approximately 25 pound weight loss over this period of time.  She denies any change in her normal bowel habits.  She specifically denies melena, hematochezia or hematemesis.  She denies any chronic GI complaints prior to this illness.  PAST MEDICAL HISTORY:  Generally in good health.  Denies any previous surgery. Denies any significant medical illness or hospitalization.  MEDICATIONS:  None.  ALLERGIES:  No known drug allergies.  SOCIAL HISTORY:  Significant for cigarette use of 1/2 pack cigarettes per day. Does not drink alcohol.  FAMILY HISTORY:  See detailed H&P.  REVIEW OF SYSTEMS:  See detailed H&P.  PHYSICAL EXAMINATION:  VITAL SIGNS:  Afebrile on presentation, but later in the ER temperature was elevated to 102 degrees, pulse 110-120, respiratory rate 18, blood pressure 118/57.  GENERAL:  She is a well-nourished, well-developed, white female who appears uncomfortable and somewhat ill.  ABDOMEN:  There is mild  distention.  There is marked, but well-localized tenderness in the right lower quadrant with guarding.  There is a palpable mass in the right lower quadrant.  No organomegaly.  LABORATORY DATA AND X-RAY FINDINGS:  Abnormal electrolytes with sodium 133, potassium 3.2, blood sugar elevated at 176.  Albumin depressed at 2.7.  LFTs normal.  Urinalysis normal for ketones.  CBC showed a white count of 12.4, hemoglobin low at 6.0 with a hematocrit of 19.4 and platelet count elevated at 666,000.  CT scan of the abdomen was obtained from the emergency room which revealed a large mass and phlegmon involving the cecum in the right lower quadrant with some surrounding fluid collections.  Radiographically, this appeared most consistent with a perforated tumor of the cecum, but complex Crohns disease or appendicitis could not be ruled out.  HOSPITAL COURSE:  The patient was admitted and elected initially to treat her with antibiotics and transfusion in preparation for surgery.  She was started on Unasyn IV.  She was transfused a total of six units of packed cells over the next three days.  Her hemoglobin came up rather gradually with these transfusions up to a preoperative hemoglobin of 10.  Her abdominal tenderness initially improved over the first couple of days and she was given a mechanical and antibiotic bowel prep preoperatively.  On the morning of surgery, she definitely had increased pain in the right lower quadrant.  She was taken to  the operating room on July 3.  Findings were a large cecal mass with a perforation into the right pelvis which tracked into the right inguinal region.  She underwent a right hemicolectomy with drainage of the abscess and a JP drain was placed into the right inguinal canal through the groin and into the right lower quadrant.  She tolerated the procedure well.  Her postoperative course was relatively smooth.  She had some pulmonary congestion postoperatively and  required efforts of pulmonary toilet and respiratory treatments for secretions.  Hemoglobin remained stable and white count gradually decreased.  She gradually steadily improved from this point.  Final pathology revealed a large cecal adenocarcinoma with perforation through to the serosa.  There was 1 of 19 nodes positive.  Abdominal exploration had revealed no evidence of metastatic disease.  Her white count gradually decreased to normal and was 9000 on discharge.  Her oxygen saturations gradually increased up to 97% on 2 L prior to discharge and her lungs were clearing.  Operative cultures grew an E. coli that was resistant to ampicillin and she is discharged home on p.o. Cipro and Flagyl.  She had drainage from a superficial portion of the central wound and a couple of staples were removed, but no deep infection found.  Her JP drain was removed prior to discharge.  SPECIAL INSTRUCTIONS:  She was given instructions on wound care discussed with the patient and her daughters at discharge.  DISCHARGE MEDICATION:  Vicodin.  FOLLOWUP:  Will arrange oncology followup as she recovers as an outpatient. Will follow up in my office in three to four days. DD:  12/28/00 TD:  12/28/00 Job: [id number]

## 2010-11-06 NOTE — Op Note (Signed)
[facility]  Patient:    Meredith [name], BETTERS Visit Number: [id number] MRN: [id number]          Service Type: DSU Location: [facility] Attending Physician:  Delsa Bern Dictated by:   Lorne Skeens. Hoxworth, M.D. Proc. Date: 02/13/01 Adm. Date:  02/13/2001                             Operative Report  PREOPERATIVE DIAGNOSIS:  Right breast mass.  POSTOPERATIVE DIAGNOSIS:  Right breast mass.  PROCEDURE:  Excisional biopsy, right breast mass.  SURGEON:  Lorne Skeens. Hoxworth, M.D.  ANESTHESIA:  Local with IV sedation.  BRIEF HISTORY:  [name] is a 70 year old white female with recently diagnosed colon cancer and now presents with a small palpable mass in the lateral aspect of the right breast that she states has been present about two months.  Fine needle aspirate was nondiagnostic.  She has a less than 1 cm freely movable superficial mass in the lateral aspect of the right breast.  DESCRIPTION OF PROCEDURE:  The patient was brought to the operating room and placed in the supine position on the operating table, and IV sedation was administered.  The right breast was sterilely prepped and draped.  Local anesthesia was used to infiltrate the overlying skin and underlying breast tissue.  A curvilinear incision was made and dissection carried down to the subcutaneous tissue.  The palpable abnormality was then completely sharply excised with some surrounding normal tissue.  Hemostasis was obtained with cautery.  The skin was then closed with running subcuticular 4-0 Monocryl and Steri-Strips.  Sponge and needle counts were correct.  Dry sterile dressings were applied and the patient taken to the recovery room in good condition. Dictated by:   Lorne Skeens. Hoxworth, M.D. Attending Physician:  Delsa Bern DD:  02/13/01 TD:  02/14/01 Job: [id number]

## 2010-11-06 NOTE — Assessment & Plan Note (Signed)
Meredith [name]                               PULMONARY OFFICE NOTE   NAME:Meredith [name], Meredith [name]                       MRN:          [id number]  DATE:03/14/2006                            DOB:          April 04, 1941    PROBLEM LIST:  1. Chronic obstructive pulmonary disease.  2. History of colon cancer with resection.   HISTORY:  She returns for followup feeling fairly stable.  A chest x-ray at  [facility] had questioned an early right lower lobe  infiltrate in July but clinically she has been free of any acute process.   MEDICATIONS:  1. Alprazolam 0.5 mg.  2. Spiriva/Advair 250/50.  3. Oxygen used at night at 2 liters.  4. Rescue albuterol nebulizer.   OBJECTIVE:  VITAL SIGNS:  Weight 186 pounds, BP 140/80, pulse regular 85,  room air saturation 89%.  GENERAL:  She looks fairly comfortable and stable on room air, despite the  low saturation.  EXTREMITIES:  Her finger and toe nails are painted.  There is no obvious  edema or cyanosis.  LUNGS:  Sounds are quiet, slow, and distant but unlabored.  HEART:  Sounds are regular without murmur or gallop.  I find no adenopathy.   PFT:  Severe obstructive airway disease with FVC 2.98 (92%), FEV1 0.99  (42%), and significant response to dilator.  There was air trapping on  measured lung volumes.  She was unable to perform a diffuse capacity  maneuver.  On a 6-minute walk test, she went only 243 meters.  Oxygen  saturation at beginning was 89% on room air and fell to 67% by the end of  the test before rebounding to 90% two minutes later.  I am not sure how  accurate this was because of her dark nail polish but assumed there was  exercise associated desaturation which she was remarkably unable to feel.   IMPRESSION:  Severe chronic obstructive pulmonary disease with exertional  hypoxia.   PLAN:  1. Pneumococcal vaccine booster was given.  2. Use oxygen 2 liters at night and as much of the  daytime as possible.  3. Sample Singulair 10 mg daily for trial at her request.  4. Theophylline 200 mg b.i.d. with meals with medication talk done.  5. Schedule return in 4 months, earlier p.r.n.                                   Clinton D. Maple Hudson, MD, [facility], FACP   CDY/MedQ  DD:  03/14/2006  DT:  03/16/2006  Job #:  [id number]   cc:   Marjory Lies, M.D.

## 2010-11-06 NOTE — Assessment & Plan Note (Signed)
[facility]                               PULMONARY OFFICE NOTE   NAME:[name], Meredith [name]                       MRN:          [id number]  DATE:01/31/2006                            DOB:          1941/06/19    PULMONARY CONSULTATION:   PROBLEM:  Pulmonary consultation for this former [facility] Chest Disease  and Allergy patient, referred now through the courtesy of Dr. Doristine Counter  because of chronic obstructive pulmonary disease.   HISTORY:  She was last seen at [facility] in 2005, at which  time she was being followed for advanced chronic obstructive pulmonary  disease.  She gradually stopped smoking around 2003, at which time she was  treated for colon cancer with partial colectomy and chemotherapy.  Since  that time she has remained off of cigarettes.  She describes occasional  cough and phlegm with no consistent pattern.  Sputum is scant and usually  white, never bloody or frankly purulent.  Exertional dyspnea limits her to  walking considerably less than one block on level ground with some variation  from one day to the next.  Her breathing does not wake her at night.  She  wears oxygen to sleep.  She has taken a prednisone taper off and on, most  recently this week, to help with her breathing.  She had had a chest x-ray  at [facility] July 5, which had shown severe COPD with  hyperinflation, question of an early right lower lobe infiltrate, normal  heart size, and some mild scarring.  She was treated with cortisone and 10  days of b.i.d. doxycycline July 5 and she feels the acute infection of that  visit has completely resolved.  She is here today with her daughter.   MEDICATIONS:  1. Alprazolam 0.5 mg.  2. Prednisone taper.  3. Spiriva once daily.  4. Advair 250/50 mg b.i.d.  5. Home oxygen at 2 L used at night and p.r.n.  6. Rescue albuterol inhaler.  7. Home nebulizer with DuoNeb used up to 4 times a day when  needed,      usually at least once a day.   REVIEW OF SYSTEMS:  She denies chest pain, palpitations, fever, blood,  adenopathy or ankle edema.  She thinks her weight is stable or drifting up  slowly.  She has not had significant reflux or other acute complaints.  She  does not notice much in the way of seasonal allergy or nasal congestion  problems.   PAST HISTORY:  1. Partial colon resection, reanastomosis and chemotherapy.  2. Prior pneumonia.  3. Asthma and COPD.  4. No history of diabetes, heart disease or deep vein thrombosis.  5. Remote breast lump.   SOCIAL HISTORY:  She has quit smoking.  Comes today with daughter.  Widowed.  She lives alone.  Retired.   FAMILY HISTORY:  Mother had emphysema.  Others with heart disease and  cancer.   OBJECTIVE:  VITAL SIGNS:  Weight 186 pounds, BP 122/70, pulse regular at  100.  Room air saturation 90%.  GENERAL:  Alert  woman, medium build, not in acute distress.  She came  upstairs to see me wearing room air today.  SKIN:  No rash.  ADENOPATHY:  None found.  HEENT:  No edema, no cyanosis or clubbing, and no neck vein distention or  stridor.  The nasal airway was clear.  Pharynx was clear.  CHEST:  Breath sounds were slow and distant but unlabored.  There were no  rales, rhonchi or wheezes.  CARDIAC:  Heart sounds were regular, somewhat muffled, without murmur or  gallop heard.  ABDOMEN:  No enlargement of liver or spleen.   IMPRESSION:  1. Advanced chronic obstructive pulmonary disease, clinically stable now.  2. Question of early infiltrate in July on an outside chest x-ray.  I      recommended we repeat that when she returns to confirm clearing, but      clinically it sounds as if she resolved a mild bronchopneumonia.   PLAN:  1. Will try to get report of her chest CT done at [facility] some months      ago during routine cancer follow-up.  2. Schedule pulmonary function tests.  3. Continue present medications.  4. Walk for  endurance as able.  5. Schedule return in 6 weeks, earlier p.r.n.   I appreciate the chance to see her again.                                   Clinton D. Maple Hudson, MD, [facility], FACP   CDY/MedQ  DD:  01/31/2006  DT:  02/01/2006  Job #:  [id number]   cc:   Meredith Lies, MD

## 2010-11-06 NOTE — Assessment & Plan Note (Signed)
[facility]                             PULMONARY OFFICE NOTE   NAME:Meredith [name], [name]                       MRN:          [id number]  DATE:07/26/2006                            DOB:          18-Feb-1941    PULMONARY FOLLOWUP:   PROBLEMS:  1. Chronic obstructive pulmonary disease.  2. History of colon cancer with resection.   HISTORY:  She returns for followup, saying that nebulizer treatments  loosen white phlegm.  She complains that Xanax is not helping  nervousness.  Has good and bad days with her breathing and this stresses  her some.  We had given theophylline 200 mg b.i.d., and I talked with  her about whether that was part of the problem with the nervousness.  She lives alone.   MEDICATIONS:  1. Alprazolam 0.5 mg.  2. Spiriva.  3. Advair 250/50.  4. Oxygen 2 liters at night and occasionally p.r.n. in the daytime.  5. Theophylline 200 mg b.i.d.  6. Albuterol rescue inhaler.  7. Home nebulizer with DuoNeb, used q.i.d. p.r.n.   Drug-intolerant of MORPHINE.   OBJECTIVE:  VITAL SIGNS:  Weight 183 pounds.  BP 112/70.  Pulse regular  at 102.  Room air saturation at 92%.  CARDIAC:  Heart sounds are regular without murmur.  GENERAL:  She has no tremor and seems calm now.  LUNGS:  Breath sounds are diminished.  Expiratory phase is slow, and  there is a faint end-expiratory wheeze bilaterally without rhonchi or  dullness.  No stridor.  She is not using accessory muscles.  There is no  peripheral edema.  No tremor.   IMPRESSION:  Chronic obstructive pulmonary disease.  Nervousness may be  secondary to her medications.   PLAN:  1. She is pending a CT scan for routine followup with Dr. Donnie Coffin at      [facility] and will check for that report.  2. Note that she has not had flu vaccine, and we have none available      now.  I discussed flu symptoms with her and would use Tamiflu if      necessary.  3. May try Tranxene 3.75 mg 1 daily p.r.n.  nervousness.  4. Theophylline level.  5. Return in four months, earlier p.r.n.  6. She is encouraged to walk for endurance and to burn off a little      nervous energy.  Note that her PFTs in September had shown severe      obstructive airway disease with FEV1 only 42% of predicted.  There      had been a response to bronchodilator.  We can repeat that if      necessary.     Clinton D. Maple Hudson, MD, [facility], FACP  Electronically Signed    CDY/MedQ  DD: 07/30/2006  DT: 07/30/2006  Job #: [id number]   cc:   Marjory Lies, M.D.  Pierce Crane, M.D.

## 2010-11-06 NOTE — Op Note (Signed)
[facility]  Patient:    RENDY, LAZARD                       MRN: [id number] Proc. Date: 12/21/00 Adm. Date:  [id number] Attending:  Glenna Fellows Tappan                           Operative Report  PREOPERATIVE DIAGNOSIS:  Cecal mass and abscess.  POSTOPERATIVE DIAGNOSIS:  Cecal cancer with perforation and abscess.  OPERATION:  Right hemicolectomy and drainage of abdominal abscess.  SURGEON:  Lorne Skeens. Hoxworth, M.D.  ASSISTANT:  Gita Kudo, M.D.  ANESTHESIA:  General.  BRIEF HISTORY:  Mertis Mosher is a 69 year old female who presents with right lower quadrant pain.  CT scan has shown an apparent mass of the cecum and associated fluid collection in the right lower quadrant.  She also presented severely anemic.  She had been transfused, treated with IV antibiotics, and undergone mechanical bowel prep and is now brought to the operating room for exploration and right hemicolectomy.  The nature of the procedure, its indications, risks of bleeding, infection, and anastomotic problems were discussed and understood preoperatively.  DESCRIPTION OF PROCEDURE:  The patient was brought to the operating room and placed in supine position on the operating table, and general endotracheal anesthesia was induced.  Foley catheter was in place.  Nasogastric tube was placed.  The abdomen was sterilely prepped and draped.  The abdomen was explored through a low midline incision extending up above the umbilicus.  On entering the peritoneal cavity, there was no free fluid.  There was an obvious large, firm mass and edema and inflammatory changes around the cecum. Initially the cecum was freed from the pelvic sidewall with careful blunt dissection, and a good size abscess cavity was entered.  Thick pus was drained and cultured.  The cecum was carefully mobilized away from the pelvic sidewall and right tube and ovary using further blunt dissection dividing  inflammatory adhesions.  The right colon was mobilized, dividing the peritoneum along the line of Toldt.  There was a moderate amount of edema in the retroperitoneum. The proximal right colon was mobilized, and then dissection was carried down distally.  Using careful cautery and blunt dissection, the peritoneum, markedly thickened and inflamed around the cecum, was incised.  The appendix was markedly dilated but not primarily involved in the process.  The terminal ileum was mobilized from the other side, dividing peritoneal attachments. Then using mostly blunt dissection, the cecum and associated large mass was able to be mobilized up out of the retroperitoneum and the cecum fully mobilized.  The abscess cavity extended from the pelvic sidewall down into the inguinal area and actually through the internal ring down into the right labium, and this was widely opened.  A little bit of necrotic tissue debrided and suctioned, and a lot of pus drained.  We then proceeded with right hemicolectomy.  The point of dissection of the proximal transverse colon was chosen, and the omentum in this area was divided between clamps and tied with 2-0 silk ties.  The lesser omentum was entered and divided, freeing the proximal transverse colon.  Point of resection on the terminal ileum at 5 cm from the cecum was chosen.  The mesentery between these two areas was then sequentially clamped and divided with two silk ties, taking as wide a swath of mesentery as possible around the cecum and  right colon. Large vessels were additionally suture ligated.  There was so much inflammatory action posterior to the cecum, that I did not try to dissect the ureter.  After the mesentery of the right colon had been completely divided, a function end-to-end anastomosis between the transverse colon and terminal ileum was created with a firing of the GIA stapler.  The common enterotomy was closed, and the specimen was then  removed with the firing of the PA60 stapler. This provided a nice widely patent anastomosis with no tension and with good blood supply.  The mesenteric defect was closed with interrupted 3-0 silks. The abdomen was thoroughly irrigated with saline, and the abscess cavity down into the right groin was thoroughly irrigated with antibiotic solution. Specimen was examined in pathology and was grossly consistent with a perforated cecal carcinoma.  A thorough exploration was performed.  The gallbladder appeared normal.  A small stone had been seen on CT, but I elected to leave this in the presence of infection.  Liver was carefully palpated and appeared to have some fatty infiltration but no evidence of metastatic disease.  There was no retroperitoneal adenopathy.  Stomach and pancreas were normal.  The entire small bowel was run and was normal.  Tubes and ovaries appeared normal.  No other colonic masses.  Following this, the abdomen was again irrigated with saline and the viscera returned to the anatomic position.  A 19 Blake drain was placed through a stab wound in the right labia where it could be passed up through the inguinal canal, through the abscess, and into the right lower quadrant in the area of the abscess and was sutured in place at the skin.  The midline fascia and peritoneum were then closed with running #1 PDS beginning at the uterine incision and tied centrally.  Subcutaneous tissue was copiously irrigated with antibiotic solution and the skin closed with staples.  Sponge, needle, and instrument counts were correct.  Dry sterile dressing was applied, and the patient was taken to the recovery room in good condition. DD:  12/21/00 TD:  12/21/00 Job: [id number]

## 2010-11-12 ENCOUNTER — Ambulatory Visit: Payer: Self-pay | Admitting: Internal Medicine

## 2010-12-01 ENCOUNTER — Encounter: Payer: Self-pay | Admitting: Internal Medicine

## 2010-12-01 ENCOUNTER — Ambulatory Visit (INDEPENDENT_AMBULATORY_CARE_PROVIDER_SITE_OTHER): Payer: Medicare Other | Admitting: Internal Medicine

## 2010-12-01 VITALS — BP 120/78 | HR 77 | Ht 66.0 in

## 2010-12-01 DIAGNOSIS — R220 Localized swelling, mass and lump, head: Secondary | ICD-10-CM

## 2010-12-01 DIAGNOSIS — J449 Chronic obstructive pulmonary disease, unspecified: Secondary | ICD-10-CM

## 2010-12-01 DIAGNOSIS — R221 Localized swelling, mass and lump, neck: Secondary | ICD-10-CM

## 2010-12-01 NOTE — Assessment & Plan Note (Addendum)
Last cxr 12/ 2011. She is clinically comfortable and near her baseline. No recent infection. She remains O2 dependent.

## 2010-12-01 NOTE — Progress Notes (Signed)
  Subjective:    Patient ID: Meredith [name], female    DOB: [birth year], 70 y.o.   MRN: [id number]  HPI 12/01/10- COPD, chronic respiratory failure- family here Last here May 01, 2011- note reviewed. She feels she is doing well. Atrovent HFA some help, but not cost effective.  Continues daily Brovana  and budesonide- helps. Also continues theophylline. Cough varies, but not nearly as bad as it used to be.  She tolerated anesthesia for surgical removal of cyst on right neck last Fall. Aware of very easy DOE - we discussed maintaining enough activity to maintain some stamina. Remains O2 dependent. Review of Systems Constitutional:   No weight loss, night sweats,  Fevers, chills, fatigue, lassitude. HEENT:   No headaches,  Difficulty swallowing,  Tooth/dental problems,  Sore throat,                No sneezing, itching, ear ache, nasal congestion, post nasal drip,   CV:  No chest pain,  Orthopnea, PND, swelling in lower extremities, anasarca, dizziness, palpitations  GI  No heartburn, indigestion, abdominal pain, nausea, vomiting, diarrhea, change in bowel habits, loss of appetite  Resp:   No excess mucus, no change in  cough,  No non-productive cough,  No coughing up of blood.  No change in color of mucus.  No wheezing.  Skin: no rash or lesions.  GU: no dysuria, change in color of urine, no urgency or frequency.  No flank pain.  MS:  No joint pain or swelling.  No decreased range of motion.  No back pain.  Psych:  No change in mood or affect. No depression or anxiety.  No memory loss.      Objective:   Physical Exam General- Alert, Oriented, Affect-appropriate, Distress- none acute   Very alert wheelchair, portable O2  Skin- rash-none, lesions- none, excoriation- none  Lymphadenopathy- none  Head- atraumatic  Eyes- Gross vision intact, PERRLA, conjunctivae clear secretions  Ears- Hearing, canals, Tm- normal  Nose- Clear, No-Septal dev, mucus, polyps, erosion, perforation    Throat- Mallampati II , mucosa clear , drainage- none, tonsils- atrophic  Neck- flexible , trachea midline, no stridor , thyroid nl, carotid no bruit  Chest - symmetrical excursion , unlabored     Heart/CV- RRR , no murmur , no gallop  , no rub, nl s1 s2                     - JVD- none , edema- none, stasis changes- none, varices- none     Lung- Loose cough , dullness-none, rub- none     Chest wall-   Abd- tender-no, distended-no, bowel sounds-present, HSM- no  Br/ Gen/ Rectal- Not done, not indicated  Extrem- cyanosis- none, clubbing, none, atrophy- none, strength- nl  Neuro- grossly intact to observation         Assessment & Plan:

## 2010-12-01 NOTE — Patient Instructions (Signed)
Please call sooner rather than later if you feel problems starting.

## 2010-12-02 ENCOUNTER — Other Ambulatory Visit: Payer: Self-pay | Admitting: *Deleted

## 2010-12-02 MED ORDER — TEMAZEPAM 15 MG PO CAPS: 15.0000 mg | ORAL_CAPSULE | Freq: Every evening | ORAL | Status: DC | PRN

## 2010-12-02 NOTE — Progress Notes (Signed)
Refill request received from [facility] in [place], signed by [facility] and faxed back to the pharmacy.

## 2010-12-05 ENCOUNTER — Encounter: Payer: Self-pay | Admitting: Internal Medicine

## 2010-12-05 NOTE — Assessment & Plan Note (Signed)
Benign mass successfully removed from right neck.

## 2010-12-12 ENCOUNTER — Other Ambulatory Visit: Payer: Self-pay | Admitting: Internal Medicine

## 2010-12-19 ENCOUNTER — Other Ambulatory Visit: Payer: Self-pay | Admitting: Internal Medicine

## 2010-12-21 NOTE — Telephone Encounter (Signed)
Ok to fill this. 

## 2010-12-21 NOTE — Telephone Encounter (Signed)
Please advise if okay to refill. Thanks.  

## 2010-12-22 ENCOUNTER — Telehealth: Payer: Self-pay | Admitting: Internal Medicine

## 2010-12-22 NOTE — Telephone Encounter (Signed)
ATC # listed above - NA and unable to leave message.  WBC.

## 2010-12-22 NOTE — Telephone Encounter (Signed)
Encounter closed in error and sent back to CDY.

## 2010-12-22 NOTE — Telephone Encounter (Signed)
Spoke with pt. She is c/o sores on her gums, mouth feels sore. States " I always have thrush"- states does rinse mouth after use of inhaled meds. Would like rx for thrush. Please advise, thanks!

## 2010-12-22 NOTE — Telephone Encounter (Signed)
Offer Duke's magic Mouthwash  5 cc swish and swallow  4 x daily,  # 200 ml,   Ref x 1

## 2010-12-24 NOTE — Telephone Encounter (Signed)
[id number] 

## 2010-12-25 MED ORDER — FIRST-DUKES MOUTHWASH MT SUSP: OROMUCOSAL | Status: DC

## 2010-12-25 NOTE — Telephone Encounter (Signed)
Addended by: Gweneth Dimitri D on: 12/25/2010 09:35 AM   Modules accepted: Orders

## 2010-12-25 NOTE — Telephone Encounter (Signed)
Pt returned call.  She was informed CDY would like her to take duke's majic mouthwash 5cc swish and swallow qid.  She verbalized understanding of this and aware rx sent to [facility] [place].

## 2011-02-05 ENCOUNTER — Encounter (HOSPITAL_BASED_OUTPATIENT_CLINIC_OR_DEPARTMENT_OTHER): Payer: Self-pay | Admitting: *Deleted

## 2011-02-05 ENCOUNTER — Emergency Department (INDEPENDENT_AMBULATORY_CARE_PROVIDER_SITE_OTHER): Payer: Medicare Other

## 2011-02-05 ENCOUNTER — Emergency Department (HOSPITAL_BASED_OUTPATIENT_CLINIC_OR_DEPARTMENT_OTHER)
Admission: EM | Admit: 2011-02-05 | Discharge: 2011-02-05 | Disposition: A | Discharge status: Home / Self Care | Payer: Medicare Other | Source: Home / Self Care | Attending: Emergency Medicine | Admitting: Emergency Medicine

## 2011-02-05 DIAGNOSIS — J449 Chronic obstructive pulmonary disease, unspecified: Secondary | ICD-10-CM

## 2011-02-05 DIAGNOSIS — R0602 Shortness of breath: Secondary | ICD-10-CM

## 2011-02-05 DIAGNOSIS — J441 Chronic obstructive pulmonary disease with (acute) exacerbation: Secondary | ICD-10-CM | POA: Insufficient documentation

## 2011-02-05 DIAGNOSIS — F411 Generalized anxiety disorder: Secondary | ICD-10-CM | POA: Insufficient documentation

## 2011-02-05 DIAGNOSIS — Z85038 Personal history of other malignant neoplasm of large intestine: Secondary | ICD-10-CM

## 2011-02-05 DIAGNOSIS — Z9981 Dependence on supplemental oxygen: Secondary | ICD-10-CM | POA: Insufficient documentation

## 2011-02-05 LAB — CBC
HCT: 39.1 % (ref 36.0–46.0)
Hemoglobin: 12.8 g/dL (ref 12.0–15.0)
RDW: 12.3 % (ref 11.5–15.5)
WBC: 16.5 10*3/uL — ABNORMAL HIGH (ref 4.0–10.5)

## 2011-02-05 LAB — POCT I-STAT 3, ART BLOOD GAS (G3+)
Acid-Base Excess: 8 mmol/L — ABNORMAL HIGH (ref 0.0–2.0)
Bicarbonate: 36.4 mEq/L — ABNORMAL HIGH (ref 20.0–24.0)
pO2, Arterial: 60 mmHg — ABNORMAL LOW (ref 80.0–100.0)

## 2011-02-05 LAB — BASIC METABOLIC PANEL
BUN: 9 mg/dL (ref 6–23)
Chloride: 97 mEq/L (ref 96–112)
GFR calc Af Amer: >60 mL/min (ref >60)
Glucose, Bld: 108 mg/dL — ABNORMAL HIGH (ref 70–99)
Potassium: 3.8 mEq/L (ref 3.5–5.1)

## 2011-02-05 MED ORDER — IPRATROPIUM BROMIDE 0.02 % IN SOLN
0.5000 mg | Freq: Once | RESPIRATORY_TRACT | Status: AC
  Administered 2011-02-05: 0.5 mg via RESPIRATORY_TRACT

## 2011-02-05 MED ORDER — METHYLPREDNISOLONE SODIUM SUCC 125 MG IJ SOLR
125.0000 mg | Freq: Once | INTRAMUSCULAR | Status: AC
  Administered 2011-02-05: 125 mg via INTRAVENOUS
  Filled 2011-02-05: qty 2

## 2011-02-05 MED ORDER — IPRATROPIUM BROMIDE 0.02 % IN SOLN
RESPIRATORY_TRACT | Status: AC
  Administered 2011-02-05: 0.5 mg via RESPIRATORY_TRACT
  Filled 2011-02-05: qty 2.5

## 2011-02-05 MED ORDER — ALBUTEROL SULFATE (5 MG/ML) 0.5% IN NEBU
INHALATION_SOLUTION | RESPIRATORY_TRACT | Status: AC
  Filled 2011-02-05: qty 1

## 2011-02-05 MED ORDER — ALBUTEROL SULFATE (5 MG/ML) 0.5% IN NEBU
5.0000 mg | INHALATION_SOLUTION | Freq: Once | RESPIRATORY_TRACT | Status: AC
  Administered 2011-02-05: 5 mg via RESPIRATORY_TRACT

## 2011-02-05 MED ORDER — PREDNISONE 10 MG PO TABS: 20.0000 mg | ORAL_TABLET | Freq: Every day | ORAL | Status: AC

## 2011-02-05 MED ORDER — AZITHROMYCIN 250 MG PO TABS: 250.0000 mg | ORAL_TABLET | Freq: Every day | ORAL | Status: DC

## 2011-02-05 MED ORDER — SODIUM CHLORIDE 0.9 % IV SOLN
INTRAVENOUS | Status: DC
  Administered 2011-02-05: 18:00:00 via INTRAVENOUS

## 2011-02-05 NOTE — ED Notes (Signed)
Pt. Reports she is having shortness of breath.  Pt. Sats 71% on neck pulse ox and at home 62% sat.  Pt. Is alert and oriented.

## 2011-02-05 NOTE — ED Notes (Signed)
Pt. On 4 liters oxygen with treatments being done by Resp. Therapist Brett Canales.

## 2011-02-05 NOTE — ED Notes (Signed)
No edema noted in Pt. Extremities.

## 2011-02-05 NOTE — ED Provider Notes (Addendum)
History     CSN: [id number] Arrival date & time: 02/05/2011  3:16 PM  Chief Complaint  Patient presents with  . Shortness of Breath   Patient is a 70 y.o. female presenting with shortness of breath. The history is provided by the patient and a relative.  Shortness of Breath  The current episode started today. The problem occurs continuously. The problem has been gradually worsening. The problem is severe. The symptoms are relieved by nothing. The symptoms are aggravated by activity. Associated symptoms include cough, shortness of breath and wheezing. Pertinent negatives include no chest pain and no fever. She is currently using steroids. Her past medical history is significant for past wheezing.   KNOWN SIG COPD ON 3 LITERS OXYGEN FOLLOWED BY DR [facility], AND [facility]. FAMILY THINKS THIS IS AN EXACERBATION OF HER COPD , BUT ? PNEUMONIA. PRODUCTIVE COUGH GREEN SPUTUM.  NO FEVER.  Past Medical History  Diagnosis Date  . COPD (chronic obstructive pulmonary disease)   . Acute-on-chronic respiratory failure     vent/permissive hypercapnea  . Colon cancer   . Anxiety   . Insomnia     Past Surgical History  Procedure Date  . Colon surgery   . Cyst removed from neck     Family History  Problem Relation Age of Onset  . COPD Mother   . Heart attack Father     History  Substance Use Topics  . Smoking status: Former Games developer  . Smokeless tobacco: Former Neurosurgeon    Quit date: 09/03/2008  . Alcohol Use: Not on file    OB History    Grav Para Term Preterm Abortions TAB SAB Ect Mult Living                  Review of Systems  Constitutional: Positive for fatigue. Negative for fever.  HENT: Positive for congestion.   Respiratory: Positive for cough, shortness of breath and wheezing.   Cardiovascular: Negative for chest pain.  Gastrointestinal: Negative for vomiting and abdominal pain.  Genitourinary: Negative for dysuria.  Musculoskeletal: Negative for back pain.    Skin: Negative for rash.  Neurological: Negative for headaches.  Hematological: Does not bruise/bleed easily.    Physical Exam  BP 98/53  Pulse 103  Resp 22  Ht 5\' 5"  (1.651 m)  Wt 192 lb (87.091 kg)  BMI 31.95 kg/m2  SpO2 91%  Physical Exam  Nursing note and vitals reviewed. Constitutional: She is oriented to person, place, and time. She appears well-developed and well-nourished. She appears distressed.  HENT:  Head: Normocephalic and atraumatic.  Mouth/Throat: Oropharynx is clear and moist.  Eyes: Conjunctivae and EOM are normal. Pupils are equal, round, and reactive to light.  Neck: Normal range of motion. Neck supple.  Cardiovascular: Normal rate, regular rhythm and normal heart sounds.   No murmur heard. Pulmonary/Chest: She is in respiratory distress. She has wheezes. She has no rales.  Abdominal: Soft. Bowel sounds are normal. There is no tenderness.  Musculoskeletal: Normal range of motion. She exhibits no tenderness.  Neurological: She is alert and oriented to person, place, and time. No cranial nerve deficit. She exhibits normal muscle tone. Coordination normal.  Skin: Skin is warm and dry. Rash noted.    ED Course  Procedures  Results for orders placed during the hospital encounter of 02/05/11  CBC      Component Value Range   WBC 16.5 (*) 4.0 - 10.5 (K/uL)   RBC 4.14  3.87 - 5.11 (MIL/uL)  Hemoglobin 12.8  12.0 - 15.0 (g/dL)   HCT 16.1  09.6 - 04.5 (%)   MCV 94.4  78.0 - 100.0 (fL)   MCH 30.9  26.0 - 34.0 (pg)   MCHC 32.7  30.0 - 36.0 (g/dL)   RDW 40.9  81.1 - 91.4 (%)   Platelets 241  150 - 400 (K/uL)  BASIC METABOLIC PANEL      Component Value Range   Sodium 141  135 - 145 (mEq/L)   Potassium 3.8  3.5 - 5.1 (mEq/L)   Chloride 97  96 - 112 (mEq/L)   CO2 36 (*) 19 - 32 (mEq/L)   Glucose, Bld 108 (*) 70 - 99 (mg/dL)   BUN 9  6 - 23 (mg/dL)   Creatinine, Ser 7.82  0.50 - 1.10 (mg/dL)   Calcium 95.6  8.4 - 10.5 (mg/dL)   GFR calc non Af Amer >60   >60 (mL/min)   GFR calc Af Amer >60  >60 (mL/min)  POCT I-STAT 3, BLOOD GAS (G3+)      Component Value Range   pH, Arterial 7.356  7.350 - 7.400    pCO2 arterial 64.9 (*) 35.0 - 45.0 (mmHg)   pO2, Arterial 60.0 (*) 80.0 - 100.0 (mmHg)   Bicarbonate 36.4 (*) 20.0 - 24.0 (mEq/L)   TCO2 38  0 - 100 (mmol/L)   O2 Saturation 88.0     Acid-Base Excess 8.0 (*) 0.0 - 2.0 (mmol/L)   Patient temperature 37.0 C     Collection site RADIAL, ALLEN'S TEST ACCEPTABLE     Drawn by Operator     Sample type ARTERIAL     Comment NOTIFIED PHYSICIAN     Results for orders placed during the hospital encounter of 02/05/11  CBC      Component Value Range   WBC 16.5 (*) 4.0 - 10.5 (K/uL)   RBC 4.14  3.87 - 5.11 (MIL/uL)   Hemoglobin 12.8  12.0 - 15.0 (g/dL)   HCT 21.3  08.6 - 57.8 (%)   MCV 94.4  78.0 - 100.0 (fL)   MCH 30.9  26.0 - 34.0 (pg)   MCHC 32.7  30.0 - 36.0 (g/dL)   RDW 46.9  62.9 - 52.8 (%)   Platelets 241  150 - 400 (K/uL)  BASIC METABOLIC PANEL      Component Value Range   Sodium 141  135 - 145 (mEq/L)   Potassium 3.8  3.5 - 5.1 (mEq/L)   Chloride 97  96 - 112 (mEq/L)   CO2 36 (*) 19 - 32 (mEq/L)   Glucose, Bld 108 (*) 70 - 99 (mg/dL)   BUN 9  6 - 23 (mg/dL)   Creatinine, Ser 4.13  0.50 - 1.10 (mg/dL)   Calcium 24.4  8.4 - 10.5 (mg/dL)   GFR calc non Af Amer >60  >60 (mL/min)   GFR calc Af Amer >60  >60 (mL/min)  POCT I-STAT 3, BLOOD GAS (G3+)      Component Value Range   pH, Arterial 7.356  7.350 - 7.400    pCO2 arterial 64.9 (*) 35.0 - 45.0 (mmHg)   pO2, Arterial 60.0 (*) 80.0 - 100.0 (mmHg)   Bicarbonate 36.4 (*) 20.0 - 24.0 (mEq/L)   TCO2 38  0 - 100 (mmol/L)   O2 Saturation 88.0     Acid-Base Excess 8.0 (*) 0.0 - 2.0 (mmol/L)   Patient temperature 37.0 C     Collection site RADIAL, ALLEN'S TEST ACCEPTABLE     Drawn by  Operator     Sample type ARTERIAL     Comment NOTIFIED PHYSICIAN     Dg Chest 2 View  02/05/2011  *RADIOLOGY REPORT*  Clinical Data: Shortness of  breath.  History of COPD, colon cancer.  CHEST - 2 VIEW  Comparison: 05/26/2010  Findings: Heart size is normal.  There are marked perihilar peribronchial changes.  Emphysematous changes are noted in the apices.  Crowded basilar markings are present.  No definite focal consolidations or pleural effusions identified.  There is atherosclerotic calcification of the thoracic aorta.  IMPRESSION:  1.  Marked emphysematous changes and bronchitic changes. 2. No focal pulmonary abnormality.  Original Report Authenticated By: Patterson Hammersmith, M.D.       MDM BY HX  AND OLD ABG WITH SIG COPD AND RETENTION WITH COMPENSATION. IMPROVED SIG IN ED WITH NEB OF ALBUTEROL AND ATROVENT AND IV SOLUMEDROL. SATS IN LOW 90'S ON HER USUAL 3 LITERS [place] OXYGEN. AS PER FAMILY AND PATIENT GOOD ENOUGH TO GO HOME AND WANTS TO GO HOME. NO WHEEZING AT DISCHARGE.  INITIAL ABG NOTED BUT PATIENT VERY IMPROVED WITH TREATMENT. CLINICALLY GOOD FOR GOING HOME. MENTAL STATUS IS FINE AND BREATHING IMPROVED.       Shelda Jakes, MD 02/05/11 1800  Shelda Jakes, MD 02/05/11 [phone]

## 2011-02-07 ENCOUNTER — Emergency Department (HOSPITAL_COMMUNITY): Payer: Medicare Other

## 2011-02-07 ENCOUNTER — Inpatient Hospital Stay (HOSPITAL_COMMUNITY)
Admission: EM | Admit: 2011-02-07 | Discharge: 2011-02-18 | DRG: 208 | Disposition: A | Discharge status: Skilled Nursing Facility | Payer: Medicare Other | Attending: Internal Medicine | Admitting: Internal Medicine

## 2011-02-07 DIAGNOSIS — J441 Chronic obstructive pulmonary disease with (acute) exacerbation: Secondary | ICD-10-CM | POA: Diagnosis present

## 2011-02-07 DIAGNOSIS — E669 Obesity, unspecified: Secondary | ICD-10-CM | POA: Diagnosis present

## 2011-02-07 DIAGNOSIS — J9600 Acute respiratory failure, unspecified whether with hypoxia or hypercapnia: Secondary | ICD-10-CM

## 2011-02-07 DIAGNOSIS — Z79899 Other long term (current) drug therapy: Secondary | ICD-10-CM

## 2011-02-07 DIAGNOSIS — R7309 Other abnormal glucose: Secondary | ICD-10-CM | POA: Diagnosis present

## 2011-02-07 DIAGNOSIS — E876 Hypokalemia: Secondary | ICD-10-CM | POA: Diagnosis not present

## 2011-02-07 DIAGNOSIS — J189 Pneumonia, unspecified organism: Principal | ICD-10-CM | POA: Diagnosis present

## 2011-02-07 DIAGNOSIS — J9620 Acute and chronic respiratory failure, unspecified whether with hypoxia or hypercapnia: Secondary | ICD-10-CM | POA: Diagnosis present

## 2011-02-07 DIAGNOSIS — R5381 Other malaise: Secondary | ICD-10-CM | POA: Diagnosis present

## 2011-02-07 DIAGNOSIS — Z9981 Dependence on supplemental oxygen: Secondary | ICD-10-CM

## 2011-02-07 DIAGNOSIS — Z22322 Carrier or suspected carrier of Methicillin resistant Staphylococcus aureus: Secondary | ICD-10-CM

## 2011-02-07 DIAGNOSIS — E875 Hyperkalemia: Secondary | ICD-10-CM | POA: Diagnosis not present

## 2011-02-07 LAB — URINALYSIS, ROUTINE W REFLEX MICROSCOPIC
Nitrite: NEGATIVE
Specific Gravity, Urine: 1.012 (ref 1.005–1.030)
Urobilinogen, UA: 0.2 mg/dL (ref 0.0–1.0)

## 2011-02-07 LAB — POCT I-STAT 3, ART BLOOD GAS (G3+)
Acid-Base Excess: 10 mmol/L — ABNORMAL HIGH (ref 0.0–2.0)
Acid-Base Excess: 10 mmol/L — ABNORMAL HIGH (ref 0.0–2.0)
Bicarbonate: 41.7 meq/L — ABNORMAL HIGH (ref 20.0–24.0)
Bicarbonate: 36.6 mEq/L — ABNORMAL HIGH (ref 20.0–24.0)
O2 Saturation: 99 %
Patient temperature: 98
Patient temperature: 98
TCO2: 45 mmol/L (ref 0–100)
TCO2: 38 mmol/L (ref 0–100)
pCO2 arterial: 93.5 mmHg (ref 35.0–45.0)
pH, Arterial: 7.256 — ABNORMAL LOW (ref 7.350–7.400)
pO2, Arterial: 154 mmHg — ABNORMAL HIGH (ref 80.0–100.0)

## 2011-02-07 LAB — POCT I-STAT, CHEM 8
BUN: 13 mg/dL (ref 6–23)
Calcium, Ion: 1.25 mmol/L (ref 1.12–1.32)
Chloride: 95 mEq/L — ABNORMAL LOW (ref 96–112)
Creatinine, Ser: 1.2 mg/dL — ABNORMAL HIGH (ref 0.50–1.10)
Glucose, Bld: 106 mg/dL — ABNORMAL HIGH (ref 70–99)
HCT: 42 % (ref 36.0–46.0)
Hemoglobin: 14.3 g/dL (ref 12.0–15.0)
Potassium: 3.5 mEq/L (ref 3.5–5.1)
Sodium: 142 mEq/L (ref 135–145)
TCO2: 41 mmol/L (ref 0–100)

## 2011-02-07 LAB — DIFFERENTIAL
Basophils Absolute: 0 10*3/uL (ref 0.0–0.1)
Lymphocytes Relative: 23 % (ref 12–46)
Monocytes Absolute: 1.7 10*3/uL — ABNORMAL HIGH (ref 0.1–1.0)
Monocytes Relative: 12 % (ref 3–12)
Neutro Abs: 9.4 10*3/uL — ABNORMAL HIGH (ref 1.7–7.7)

## 2011-02-07 LAB — POCT I-STAT TROPONIN I: Troponin i, poc: 0 ng/mL (ref 0.00–0.08)

## 2011-02-07 LAB — COMPREHENSIVE METABOLIC PANEL
ALT: 19 U/L (ref 0–35)
Alkaline Phosphatase: 80 U/L (ref 39–117)
CO2: 39 mEq/L — ABNORMAL HIGH (ref 19–32)
Calcium: 10.1 mg/dL (ref 8.4–10.5)
Chloride: 96 mEq/L (ref 96–112)
GFR calc Af Amer: >60 mL/min (ref >60)
GFR calc non Af Amer: >60 mL/min (ref >60)
Glucose, Bld: 102 mg/dL — ABNORMAL HIGH (ref 70–99)
Potassium: 3.5 mEq/L (ref 3.5–5.1)
Sodium: 142 mEq/L (ref 135–145)
Total Bilirubin: 0.3 mg/dL (ref 0.3–1.2)

## 2011-02-07 LAB — URINE MICROSCOPIC-ADD ON

## 2011-02-07 LAB — CBC
HCT: 39.9 % (ref 36.0–46.0)
Hemoglobin: 13 g/dL (ref 12.0–15.0)
MCHC: 32.6 g/dL (ref 30.0–36.0)

## 2011-02-07 LAB — PROTIME-INR: INR: 1.01 (ref 0.00–1.49)

## 2011-02-08 ENCOUNTER — Inpatient Hospital Stay (HOSPITAL_COMMUNITY): Payer: Medicare Other

## 2011-02-08 DIAGNOSIS — J449 Chronic obstructive pulmonary disease, unspecified: Secondary | ICD-10-CM

## 2011-02-08 DIAGNOSIS — R7989 Other specified abnormal findings of blood chemistry: Secondary | ICD-10-CM

## 2011-02-08 DIAGNOSIS — J9600 Acute respiratory failure, unspecified whether with hypoxia or hypercapnia: Secondary | ICD-10-CM

## 2011-02-08 DIAGNOSIS — J13 Pneumonia due to Streptococcus pneumoniae: Secondary | ICD-10-CM

## 2011-02-08 LAB — CARDIAC PANEL(CRET KIN+CKTOT+MB+TROPI)
CK, MB: 3.4 ng/mL (ref 0.3–4.0)
CK, MB: 2.4 ng/mL (ref 0.3–4.0)
CK, MB: 2.7 ng/mL (ref 0.3–4.0)
Relative Index: INVALID (ref 0.0–2.5)
Relative Index: INVALID (ref 0.0–2.5)
Total CK: 43 U/L (ref 7–177)
Total CK: 86 U/L (ref 7–177)
Troponin I: <0.3 ng/mL (ref <0.30)
Troponin I: <0.3 ng/mL (ref <0.30)

## 2011-02-08 LAB — LEGIONELLA ANTIGEN, URINE

## 2011-02-08 LAB — CBC
MCH: 30.5 pg (ref 26.0–34.0)
Platelets: 242 10*3/uL (ref 150–400)
RBC: 3.15 MIL/uL — ABNORMAL LOW (ref 3.87–5.11)
RDW: 12.5 % (ref 11.5–15.5)
WBC: 9.1 10*3/uL (ref 4.0–10.5)

## 2011-02-08 LAB — POCT I-STAT 3, ART BLOOD GAS (G3+)
O2 Saturation: 99 %
Patient temperature: 36.4
pCO2 arterial: 50.2 mmHg — ABNORMAL HIGH (ref 35.0–45.0)
pH, Arterial: 7.387 (ref 7.350–7.400)

## 2011-02-08 LAB — BASIC METABOLIC PANEL
CO2: 30 mEq/L (ref 19–32)
Calcium: 7.8 mg/dL — ABNORMAL LOW (ref 8.4–10.5)
GFR calc Af Amer: >60 mL/min (ref >60)
Sodium: 138 mEq/L (ref 135–145)

## 2011-02-08 LAB — GLUCOSE, CAPILLARY
Glucose-Capillary: 180 mg/dL — ABNORMAL HIGH (ref 70–99)
Glucose-Capillary: 288 mg/dL — ABNORMAL HIGH (ref 70–99)

## 2011-02-09 ENCOUNTER — Inpatient Hospital Stay (HOSPITAL_COMMUNITY): Payer: Medicare Other

## 2011-02-09 DIAGNOSIS — J449 Chronic obstructive pulmonary disease, unspecified: Secondary | ICD-10-CM

## 2011-02-09 DIAGNOSIS — R7989 Other specified abnormal findings of blood chemistry: Secondary | ICD-10-CM

## 2011-02-09 DIAGNOSIS — J9600 Acute respiratory failure, unspecified whether with hypoxia or hypercapnia: Secondary | ICD-10-CM

## 2011-02-09 DIAGNOSIS — J13 Pneumonia due to Streptococcus pneumoniae: Secondary | ICD-10-CM

## 2011-02-09 LAB — POCT I-STAT 3, ART BLOOD GAS (G3+)
Acid-Base Excess: 7 mmol/L — ABNORMAL HIGH (ref 0.0–2.0)
pCO2 arterial: 49.7 mmHg — ABNORMAL HIGH (ref 35.0–45.0)
pO2, Arterial: 148 mmHg — ABNORMAL HIGH (ref 80.0–100.0)

## 2011-02-09 LAB — BASIC METABOLIC PANEL
BUN: 22 mg/dL (ref 6–23)
Chloride: 102 mEq/L (ref 96–112)
Creatinine, Ser: 0.82 mg/dL (ref 0.50–1.10)
GFR calc Af Amer: >60 mL/min (ref >60)
GFR calc non Af Amer: >60 mL/min (ref >60)

## 2011-02-09 LAB — GLUCOSE, CAPILLARY
Glucose-Capillary: 211 mg/dL — ABNORMAL HIGH (ref 70–99)
Glucose-Capillary: 153 mg/dL — ABNORMAL HIGH (ref 70–99)
Glucose-Capillary: 175 mg/dL — ABNORMAL HIGH (ref 70–99)
Glucose-Capillary: 151 mg/dL — ABNORMAL HIGH (ref 70–99)

## 2011-02-09 LAB — CBC
MCHC: 32.3 g/dL (ref 30.0–36.0)
MCV: 93.5 fL (ref 78.0–100.0)
Platelets: 283 10*3/uL (ref 150–400)
RDW: 12.5 % (ref 11.5–15.5)
WBC: 10.5 10*3/uL (ref 4.0–10.5)

## 2011-02-09 LAB — URINE CULTURE
Colony Count: 3000
Culture  Setup Time: 201208192114

## 2011-02-10 ENCOUNTER — Inpatient Hospital Stay (HOSPITAL_COMMUNITY): Payer: Medicare Other

## 2011-02-10 LAB — BASIC METABOLIC PANEL
CO2: 33 mEq/L — ABNORMAL HIGH (ref 19–32)
Chloride: 104 mEq/L (ref 96–112)
Glucose, Bld: 235 mg/dL — ABNORMAL HIGH (ref 70–99)
Sodium: 141 mEq/L (ref 135–145)

## 2011-02-10 LAB — CULTURE, RESPIRATORY W GRAM STAIN

## 2011-02-10 LAB — POCT I-STAT 3, ART BLOOD GAS (G3+)
Acid-Base Excess: 6 mmol/L — ABNORMAL HIGH (ref 0.0–2.0)
Bicarbonate: 36.9 mEq/L — ABNORMAL HIGH (ref 20.0–24.0)
Bicarbonate: 36.6 mEq/L — ABNORMAL HIGH (ref 20.0–24.0)
O2 Saturation: 95 %
Patient temperature: 36.6
Patient temperature: 37.8
TCO2: 39 mmol/L (ref 0–100)
TCO2: 39 mmol/L (ref 0–100)
pCO2 arterial: 90.6 mmHg (ref 35.0–45.0)
pCO2 arterial: 79.3 mmHg (ref 35.0–45.0)
pH, Arterial: 7.213 — ABNORMAL LOW (ref 7.350–7.400)
pH, Arterial: 7.184 — CL (ref 7.350–7.400)
pH, Arterial: 7.269 — ABNORMAL LOW (ref 7.350–7.400)

## 2011-02-10 LAB — GLUCOSE, CAPILLARY
Glucose-Capillary: 137 mg/dL — ABNORMAL HIGH (ref 70–99)
Glucose-Capillary: 168 mg/dL — ABNORMAL HIGH (ref 70–99)
Glucose-Capillary: 195 mg/dL — ABNORMAL HIGH (ref 70–99)
Glucose-Capillary: 235 mg/dL — ABNORMAL HIGH (ref 70–99)

## 2011-02-11 ENCOUNTER — Inpatient Hospital Stay (HOSPITAL_COMMUNITY): Payer: Medicare Other

## 2011-02-11 DIAGNOSIS — Z9911 Dependence on respirator [ventilator] status: Secondary | ICD-10-CM

## 2011-02-11 DIAGNOSIS — R7989 Other specified abnormal findings of blood chemistry: Secondary | ICD-10-CM

## 2011-02-11 LAB — GLUCOSE, CAPILLARY
Glucose-Capillary: 223 mg/dL — ABNORMAL HIGH (ref 70–99)
Glucose-Capillary: 175 mg/dL — ABNORMAL HIGH (ref 70–99)
Glucose-Capillary: 97 mg/dL (ref 70–99)

## 2011-02-11 LAB — CBC
Hemoglobin: 10.4 g/dL — ABNORMAL LOW (ref 12.0–15.0)
MCV: 96.8 fL (ref 78.0–100.0)
Platelets: 276 10*3/uL (ref 150–400)
RBC: 3.45 MIL/uL — ABNORMAL LOW (ref 3.87–5.11)
WBC: 11.2 10*3/uL — ABNORMAL HIGH (ref 4.0–10.5)

## 2011-02-11 LAB — BLOOD GAS, ARTERIAL
Acid-Base Excess: 13.5 mmol/L — ABNORMAL HIGH (ref 0.0–2.0)
FIO2: 0.3 %
MECHVT: 500 mL
O2 Saturation: 93.7 %
TCO2: 40.3 mmol/L (ref 0–100)
pCO2 arterial: 58.2 mmHg (ref 35.0–45.0)

## 2011-02-11 LAB — BASIC METABOLIC PANEL
CO2: 38 mEq/L — ABNORMAL HIGH (ref 19–32)
Chloride: 100 mEq/L (ref 96–112)
Creatinine, Ser: 0.73 mg/dL (ref 0.50–1.10)
Sodium: 143 mEq/L (ref 135–145)

## 2011-02-12 LAB — GLUCOSE, CAPILLARY
Glucose-Capillary: 94 mg/dL (ref 70–99)
Glucose-Capillary: 276 mg/dL — ABNORMAL HIGH (ref 70–99)
Glucose-Capillary: 189 mg/dL — ABNORMAL HIGH (ref 70–99)

## 2011-02-13 DIAGNOSIS — J449 Chronic obstructive pulmonary disease, unspecified: Secondary | ICD-10-CM

## 2011-02-13 DIAGNOSIS — J13 Pneumonia due to Streptococcus pneumoniae: Secondary | ICD-10-CM

## 2011-02-13 DIAGNOSIS — R0602 Shortness of breath: Secondary | ICD-10-CM

## 2011-02-13 DIAGNOSIS — J9620 Acute and chronic respiratory failure, unspecified whether with hypoxia or hypercapnia: Secondary | ICD-10-CM

## 2011-02-13 LAB — GLUCOSE, CAPILLARY
Glucose-Capillary: 300 mg/dL — ABNORMAL HIGH (ref 70–99)
Glucose-Capillary: 82 mg/dL (ref 70–99)

## 2011-02-14 DIAGNOSIS — J13 Pneumonia due to Streptococcus pneumoniae: Secondary | ICD-10-CM

## 2011-02-14 DIAGNOSIS — J9620 Acute and chronic respiratory failure, unspecified whether with hypoxia or hypercapnia: Secondary | ICD-10-CM

## 2011-02-14 DIAGNOSIS — R0602 Shortness of breath: Secondary | ICD-10-CM

## 2011-02-14 DIAGNOSIS — J449 Chronic obstructive pulmonary disease, unspecified: Secondary | ICD-10-CM

## 2011-02-14 LAB — CULTURE, BLOOD (ROUTINE X 2)
Culture  Setup Time: 201208200009
Culture: NO GROWTH
Culture: NO GROWTH

## 2011-02-14 LAB — GLUCOSE, CAPILLARY
Glucose-Capillary: 173 mg/dL — ABNORMAL HIGH (ref 70–99)
Glucose-Capillary: 84 mg/dL (ref 70–99)
Glucose-Capillary: 95 mg/dL (ref 70–99)

## 2011-02-14 LAB — BASIC METABOLIC PANEL
Calcium: 9.7 mg/dL (ref 8.4–10.5)
GFR calc Af Amer: >60 mL/min (ref >60)
GFR calc non Af Amer: >60 mL/min (ref >60)
Glucose, Bld: 86 mg/dL (ref 70–99)
Potassium: 3.2 mEq/L — ABNORMAL LOW (ref 3.5–5.1)
Sodium: 141 mEq/L (ref 135–145)

## 2011-02-14 LAB — CBC
Hemoglobin: 12.2 g/dL (ref 12.0–15.0)
MCHC: 33.7 g/dL (ref 30.0–36.0)
Platelets: 283 10*3/uL (ref 150–400)
RDW: 12.2 % (ref 11.5–15.5)

## 2011-02-15 ENCOUNTER — Inpatient Hospital Stay (HOSPITAL_COMMUNITY): Payer: Medicare Other

## 2011-02-15 DIAGNOSIS — J13 Pneumonia due to Streptococcus pneumoniae: Secondary | ICD-10-CM

## 2011-02-15 DIAGNOSIS — J9620 Acute and chronic respiratory failure, unspecified whether with hypoxia or hypercapnia: Secondary | ICD-10-CM

## 2011-02-15 DIAGNOSIS — J449 Chronic obstructive pulmonary disease, unspecified: Secondary | ICD-10-CM

## 2011-02-15 LAB — BASIC METABOLIC PANEL
BUN: 21 mg/dL (ref 6–23)
Calcium: 9.4 mg/dL (ref 8.4–10.5)
Creatinine, Ser: 0.88 mg/dL (ref 0.50–1.10)
GFR calc Af Amer: >60 mL/min (ref >60)

## 2011-02-15 LAB — CBC
HCT: 35.6 % — ABNORMAL LOW (ref 36.0–46.0)
MCH: 30.1 pg (ref 26.0–34.0)
MCHC: 32.3 g/dL (ref 30.0–36.0)
MCV: 93.2 fL (ref 78.0–100.0)
Platelets: 272 10*3/uL (ref 150–400)
RDW: 12.5 % (ref 11.5–15.5)

## 2011-02-15 LAB — GLUCOSE, CAPILLARY
Glucose-Capillary: 78 mg/dL (ref 70–99)
Glucose-Capillary: 105 mg/dL — ABNORMAL HIGH (ref 70–99)

## 2011-02-16 LAB — GLUCOSE, CAPILLARY
Glucose-Capillary: 70 mg/dL (ref 70–99)
Glucose-Capillary: 106 mg/dL — ABNORMAL HIGH (ref 70–99)
Glucose-Capillary: 106 mg/dL — ABNORMAL HIGH (ref 70–99)
Glucose-Capillary: 216 mg/dL — ABNORMAL HIGH (ref 70–99)

## 2011-02-16 LAB — BASIC METABOLIC PANEL
BUN: 19 mg/dL (ref 6–23)
CO2: 37 mEq/L — ABNORMAL HIGH (ref 19–32)
Calcium: 9.6 mg/dL (ref 8.4–10.5)
Chloride: 98 mEq/L (ref 96–112)
Creatinine, Ser: 0.77 mg/dL (ref 0.50–1.10)

## 2011-02-17 ENCOUNTER — Telehealth: Payer: Self-pay | Admitting: Adult Health

## 2011-02-17 LAB — GLUCOSE, CAPILLARY
Glucose-Capillary: 84 mg/dL (ref 70–99)
Glucose-Capillary: 151 mg/dL — ABNORMAL HIGH (ref 70–99)

## 2011-02-17 NOTE — Telephone Encounter (Signed)
Please call pt for f/u appt with Dr. Maple Hudson in 2-3 weeks for hospital follow up.  She is being discharged to SNF.  Thank you Orpha Bur

## 2011-02-18 DIAGNOSIS — J13 Pneumonia due to Streptococcus pneumoniae: Secondary | ICD-10-CM

## 2011-02-18 DIAGNOSIS — J449 Chronic obstructive pulmonary disease, unspecified: Secondary | ICD-10-CM

## 2011-02-18 DIAGNOSIS — J9620 Acute and chronic respiratory failure, unspecified whether with hypoxia or hypercapnia: Secondary | ICD-10-CM

## 2011-02-18 NOTE — Discharge Summary (Addendum)
NAMEJANDI, Meredith [name]                ACCOUNT NO.:  [id number]  MEDICAL RECORD NO.:  [id number]  LOCATION:  5002                         FACILITY:  [facility]  PHYSICIAN:  Meredith D. Maple Hudson, MD, FCCP, FACPDATE OF BIRTH:  03[birth year]11[birth year]1943  DATE OF ADMISSION:  02/07/2011 DATE OF DISCHARGE:  02/17/2011                              DISCHARGE SUMMARY   DISCHARGE DIAGNOSES: 1. Acute chronic obstructive pulmonary disease exacerbation. 2. Acute respiratory failure secondary to community-acquired pneumonia     (no organisms specified). 3. Debilitation. 4. Hypokalemia.  LINES/TUBES: 1. Oral endotracheal tube from February 07, 2011 to February 11, 2011. 2. Radial arterial line from February 07, 2011 to February 11, 2011. 3. Left subclavian triple-lumen catheter from February 07, 2011 to     February 12, 2011.  ANTIBIOTIC DATA: 1. Patient received Avelox x1 on February 07, 2011. 2. Zosyn February 07, 2011, day #20. 3. Vancomycin February 07, 2011 to February 08, 2011. 4. Azithromycin. 5. Avelox from February 09, 2011 and has completed antibiotic therapy.  CULTURE DATA:  On February 07, 2011, urine culture demonstrates 3000 colonies of insignificant growth.  On February 07, 2011, urine Legionella is negative.  On February 07, 2011, urine strep antigen is negative.  On February 07, 2011, blood cultures x2 demonstrate no growth.  On February 07, 2011, respiratory culture demonstrates no growth.  On February 07, 2011, MRSA PCR screening is positive.  BEST PRACTICE:  The patient was placed on low-molecular weight heparin for DVT prophylaxis and Protonix for stress ulcer prevention.  KEY EVENTS/STUDIES:  Continue sedation protocol from February 07, 2011 to February 11, 2011.  LABORATORY DATA:  February 15, 2011; CBC demonstrates hemoglobin 11.5, hematocrit 35.6, WBC 13.5 and platelets 272.  On February 16, 2011, BMET demonstrates sodium 141, potassium 4.0, chloride 98, CO2 of 37, BUN 19, creatinine 0.77 and glucose of 81.  RADIOLOGIC DATA:   On February 05, 2011; 2-view of the chest demonstrates marked emphysematous changes and bronchitic changes.  No acute infiltrate.  February 08, 2011, chest x-ray demonstrates a left subclavian line ending in the proximal SVC.  Endotracheal tube 5 cm above the carina and patchy right airspace opacities.  February 15, 2011, chest x- ray demonstrates mild basilar atelectasis, right greater than left.  HISTORY OF PRESENT ILLNESS:  [name] is a 70 year old female with a past medical history of COPD with an FEV-1 of 42% who was seen at the [facility] Emergency Department on July 07, 2010 with cough and worsening shortness of breath.  Chest x-ray at that time was read as normal and she was given prednisone taper and azithromycin.  She worsened at home over the weekend and had fevers and EMS was called. Her initial pulse oximetry readings were in the high 80s.  She was transferred to [facility] Emergency Department, given steroids and nebulized bronchodilators, but was in extremis when she arrived to the emergency department and required immediate intubation.  Chest x-ray at that time demonstrated acute bilateral infiltrates.  She was admitted to the intensive care unit and maintained on mechanical ventilation until February 11, 2011, at which time she was successfully liberated from mechanical ventilation.  She was pancultured and  no specific organisms were isolated.  She may continue to clinical and radiographic improvement throughout ICU stay.  She was treated with nebulized bronchodilators, aggressive pulmonary hygiene and antibiotics.  She also was placed on steroid taper.  She was transitioned out of the intensive care unit and continued to make good progress from a respiratory standpoint.  At the time of discharge dictation, her chest x-ray has completely resolving pneumonia.  It is recommended that she continue with BiPAP as needed for dyspnea and sleep.  She will need O2 of 2-3 L per  minute continuously and bled in with BiPAP at night.  She did have noted severe debility during hospitalization and received physical therapy and occupational therapy.  It is recommended at this time that she continue to discharge for stent placement for further aggressive rehab.  During hospital course, she did transiently have hypokalemia which was serially monitored and treated as needed.  Of note, she did have a positive MRSA nasal PCR, which was treated during hospital course.  HOSPITAL COURSE:  By discharge diagnoses; 1. Acute chronic obstructive pulmonary disease exacerbation/acute     respiratory failure secondary to community-acquired pneumonia.  As     per HPI, [name] is a 70 year old white female with a known     history of COPD and FEV-1 of 42%.  She was seen on February 05, 2011     at [facility] ER and treated with a prednisone taper and     azithromycin as an outpatient.  She unfortunately worsened over the     weekend and did require EMS transport to [facility] on February 07, 2011, at which time she did require immediate intubation.  She also     was noted to have bilateral infiltrates on chest x-ray and was placed in the intensive care unit on mechanical ventilation and     treated with IV antibiotics, nebulized bronchodilators, and     aggressive pulmonary hygiene.  She was able to be successfully     liberated from mechanical ventilation on February 11, 2011.  She did     not require further pulmonary interventions.  Currently, at this     time it is recommended that she continue BiPAP support at night and     as needed for dyspnea.  She also was recommended to continue oxygen     at 2-3 L per minute and bled in with her BiPAP at night. 2. Debilitation.  [name] at baseline does have limited mobility in     the setting of severe dyspnea and COPD.  She does spend predominant     amount of her time at home at a desk with her computer.  It is     recommended  currently that she be placed in a skilled nursing     facility and undergo intensive rehab therapy. 3. Hypokalemia.  This was transient during hospital admission and was     serially monitored and repleted as needed.  Currently, her     potassium is within normal limits. 4. Positive MRSA screening, this was addressed during hospital course     and no further therapy needed at this time.  DISCHARGE INSTRUCTIONS: 1. Activity.  Activity as tolerated per recommendations of physical     therapy team. 2. Diet.  Diabetic diet as tolerated. 3. Follow up.  Ms. Mill will be called at [facility] for follow up with Dr. Maple [name].  DISCHARGE MEDICATIONS: 1. Acetaminophen 650 mg by mouth every 6 hours as needed. 2. Alprazolam 0.5 mg tablets half tablet by mouth 3 times a day. 3. Budesonide 0.25 mg inhaled every 6 hours. 4. NovoLog insulin 4 units subcutaneously 3 times daily with meals. 5. NovoLog insulin 1-20 units subcutaneously 3 times daily with meals     with regular sliding scale. 6. Lantus 10 units subcutaneously daily. 7. Atrovent 0.5 mg inhaled every 6 hours. 8. Xopenex 0.63 mg inhaled every 6 hours. 9. Xopenex 0.63 mg inhaled q.3 h. p.r.n. for shortness of breath or     wheezing. 10.Potassium 20 mEq by mouth daily. 11.Prednisone 10 mg tabs 20 mg p.o. daily for 3 days and 10 mg p.o.     daily as a maintenance dose. 12.Trazodone 50 mg by mouth daily at bedtime. 13.Citalopram 20 mg by mouth daily. 14.Fish oil 1000 mg tablet by mouth daily. 15.Mucinex XR OTC 1 tablet by mouth daily. 16.Vitamin C 250 mg by mouth daily. 17.Vitamin D2 1 tablet by mouth daily.  Medications that were adjusted or stopped during hospital course; 1. Xanax 2. Aleve. 3. Nicotine. 4. Azithromycin. 5. Restaurant. 6. Brovana. 7. Atrovent. 8. ProAir.  DISPOSITION AT TIME OF DISCHARGE:  Ms. Kernan has made maximum benefit of inpatient therapy and is currently medically stable and  cleared for discharge to [facility] for intensive rehab therapy.  Time spent on disposition greater than 35 minutes.     Canary Brim, NP   ______________________________ Rennis Chris. Maple Hudson, MD, FCCP, FACP    BO/MEDQ  D:  02/17/2011  T:  02/17/2011  Job:  [id number]  cc:   [facility]  Electronically Signed by Jetty Duhamel MD FCCP FACP on 02/18/2011 08:52:47 PM Electronically Signed by Canary Brim  on 02/23/2011 10:42:25 AM

## 2011-02-23 NOTE — Telephone Encounter (Signed)
I have attempted to contact pt several times; I have left a message for patient to call me back to set up post hospital appt.

## 2011-02-24 ENCOUNTER — Telehealth: Payer: Self-pay | Admitting: Internal Medicine

## 2011-02-24 NOTE — Telephone Encounter (Signed)
Katie I see where you called this pt to set up HFU. Pt daughter states the pt is in rehab facility and the earliest she will be out is in 10-15 days. I do not see an available appt. Please advise. You can cal the pt daughter. Carron Curie, CMA

## 2011-02-24 NOTE — Telephone Encounter (Signed)
Spoke with patients daughter-will call me back when patient is out of rehab. CY aware of this as well.

## 2011-03-02 ENCOUNTER — Telehealth: Payer: Self-pay | Admitting: Internal Medicine

## 2011-03-02 NOTE — Telephone Encounter (Signed)
Error.  Send to Medical Records to complete this request.  Meredith [name]

## 2011-03-03 ENCOUNTER — Telehealth: Payer: Self-pay | Admitting: Internal Medicine

## 2011-03-03 NOTE — Telephone Encounter (Signed)
Pt was released from hospital Aug 13 and pt needs HFU. Next available is not until oct 25. Please advise a work in spot for pt Dr. Maple Hudson. Thanks  Carver Fila, CMA

## 2011-03-03 NOTE — Telephone Encounter (Signed)
Spoke with Kathie Rhodes at Rehab center-pt is scheduled for Monday 03-15-11 at 2pm for post hospital.

## 2011-03-05 ENCOUNTER — Telehealth: Payer: Self-pay | Admitting: Internal Medicine

## 2011-03-05 NOTE — Telephone Encounter (Signed)
Per CDY- stay off of med until next ov with him. Spoke with Judeth Cornfield and notified of this and she verbalized understanding and will inform the pt.

## 2011-03-05 NOTE — Telephone Encounter (Signed)
Called and spoke with stephanie and she stated that the pt was in the hospital x 11 days and unsure if she got the theophylline 200mg  bid there but the pt was in rehab x 2 weeks once d/c from the hospital and she did not get the meds there either.  Should pt restart on this med or should she stay off of it?  CY please advise. thanks

## 2011-03-10 ENCOUNTER — Telehealth: Payer: Self-pay | Admitting: Internal Medicine

## 2011-03-10 NOTE — Telephone Encounter (Signed)
Per CY-okay to give verbal order

## 2011-03-10 NOTE — Telephone Encounter (Signed)
Lm on bridgette's vm tcb to give VO.

## 2011-03-10 NOTE — Telephone Encounter (Signed)
Spoke with Brigitte and gave VO per CDY for rolling walker and bedside commode.

## 2011-03-10 NOTE — Telephone Encounter (Signed)
Dr. Maple Hudson, pls advise if okay to give verbal order to [facility] for the pt to have a bedside commode and rolling walker w/ seat and basket. I spoke with Clarisse Gouge and this is what they are requesting. Pls advise.

## 2011-03-15 ENCOUNTER — Ambulatory Visit (INDEPENDENT_AMBULATORY_CARE_PROVIDER_SITE_OTHER): Payer: Medicare Other | Admitting: Internal Medicine

## 2011-03-15 ENCOUNTER — Encounter: Payer: Self-pay | Admitting: Internal Medicine

## 2011-03-15 VITALS — BP 110/76 | HR 82 | Ht 66.0 in | Wt 199.0 lb

## 2011-03-15 DIAGNOSIS — Z23 Encounter for immunization: Secondary | ICD-10-CM

## 2011-03-15 DIAGNOSIS — J9610 Chronic respiratory failure, unspecified whether with hypoxia or hypercapnia: Secondary | ICD-10-CM

## 2011-03-15 NOTE — Patient Instructions (Signed)
Prednisone 10 mg tabs-    Take 10 mg, alternating with 1/2 x 10= 5 mg every other day x 2 weeks                                            Then take 5 mg daily x 2 weeks                                             Then take 5 mg every other day x 2 weeks or until you see me next  Flu vax  Ok to sleep on 2 L O2  with your BiPAP

## 2011-03-15 NOTE — Progress Notes (Signed)
Subjective:    Patient ID: Meredith [name], female    DOB: 08/02/1940, 70 y.o.   MRN: [id number]  HPI 12/01/10- COPD, chronic respiratory failure- family here Last here May 01, 2011- note reviewed. She feels she is doing well. Atrovent HFA some help, but not cost effective.  Continues daily Brovana  and budesonide- helps. Also continues theophylline. Cough varies, but not nearly as bad as it used to be.  She tolerated anesthesia for surgical removal of cyst on right neck last Fall. Aware of very easy DOE - we discussed maintaining enough activity to maintain some stamina. Remains O2 dependent.  03/15/11- 18 yoF former smoker followed for COPD, chronic respiratory failure, complicated by hx colon cancer  - family here. PCP Dr. Benedetto Goad.  Post hospital visit-hospitalized at [facility] 8/19 - 02/17/2011 with discharge diagnoses acute exacerbation of COPD, acute respiratory failure secondary to community-acquired pneumonia (no organism identified), and debilitation. She was intubated for 4 days and treated with broad-spectrum antibiotics, completing therapy finally with Avelox. She then went to a skilled nursing facility for rehabilitation and is back home now with physical therapy. ONOX - 03/09/2011 on 2L O2  showed good oxygenation with no desat. She says she is wearing BiPAP also, but we confirmed with [facility] that the overnight oxygen study was done on oxygen alone, no BiPAP at night. BiPAP was started during the hospital stay for respiratory failure and is currently 10/5. She is now on prednisone 10 mg daily. Glucose managed by Dr. Andrey Campanile. We discussed trying to taper off the prednisone if possible. We spent at least 30 minutes discussing end of life choices, how she would feel about reintubation/ventilator and resuscitation or withdrawal of care. These were not familiar issues for her and apparently there is disagreement within the family including at least one who expects divine intervention. She  asked to have a diuretic available for "this fluid retention". Her daughter and I pointed out there was no ankle edema and no obvious evidence of significant fluid overload.  Review of Systems Constitutional:   No weight loss, night sweats,  Fevers, chills, fatigue, lassitude. HEENT:   No headaches,  Difficulty swallowing,  Tooth/dental problems,  Sore throat,                No sneezing, itching, ear ache, nasal congestion, post nasal drip,  CV:  No chest pain,  Orthopnea, PND, swelling in lower extremities, anasarca, dizziness, palpitations GI  No heartburn, indigestion, abdominal pain, nausea, vomiting, diarrhea, change in bowel habits, loss of appetite Resp:   No excess mucus, no change in  cough,  No non-productive cough,  No coughing up of blood.  No change in color of mucus.  No wheezing.  Skin: no rash or lesions. GU: no dysuria, change in color of urine, no urgency or frequency.  No flank pain. MS:  No joint pain or swelling.  No decreased range of motion.  No back pain. Psych:  No change in mood or affect. No depression or anxiety.  No memory loss.     Objective:   Physical Exam General- Alert, Oriented, Affect-appropriate, Distress- none acute;  portable oxygen 2 L per minute, obese Skin- rash-none, lesions- none, excoriation- none Lymphadenopathy- none Head- atraumatic            Eyes- Gross vision intact, PERRLA, conjunctivae clear secretions            Ears- Hearing, canals-within normal limits  Nose- Clear, no-Septal dev, mucus, polyps, erosion, perforation             Throat- Mallampati II , mucosa clear , drainage- none, tonsils- atrophic Neck- flexible , trachea midline, no stridor , thyroid nl, carotid no bruit Chest - symmetrical excursion , unlabored           Heart/CV- RRR , no murmur , no gallop  , no rub, nl s1 s2                           - JVD- none , edema- none, stasis changes- none, varices- none           Lung- few soft crackles or rales lung bases,  unlabored,  wheeze- none, cough- none , dullness-none, rub- none           Chest wall-  Abd- tender-no, distended-no, bowel sounds-present, HSM- no Br/ Gen/ Rectal- Not done, not indicated Extrem- cyanosis- none, clubbing, none, atrophy- none, strength- nl Neuro- grossly intact to observation

## 2011-03-16 ENCOUNTER — Encounter: Payer: Self-pay | Admitting: Internal Medicine

## 2011-03-16 NOTE — Assessment & Plan Note (Addendum)
Severe chronic obstructive disease with oxygen dependent respiratory failure. I would like to try to wean her off maintenance prednisone. BiPAP 10/5 was ordered during her most recent hospital stay for diagnosis of respiratory failure.. If she remains stable we will need to reassess her need for BiPAP in addition to oxygen at night.

## 2011-03-17 ENCOUNTER — Telehealth: Payer: Self-pay | Admitting: Internal Medicine

## 2011-03-17 MED ORDER — LEVOFLOXACIN 750 MG PO TABS: 750.0000 mg | ORAL_TABLET | Freq: Every day | ORAL | Status: AC

## 2011-03-17 NOTE — Telephone Encounter (Signed)
Spoke with pt. She is c/o prod cough with yellow sputum since this am. Denies any change in breathing. No fever. She states home health nurse visited her last night and says she heard "a little rattling in the lungs" which she states was also noted per CDY at last ov 9/24. She is requesting abx stating that she does not want to end up back in the hospital again. I offered ov for eval and she refused. Just wants something called in and states "zpack would be no help". Will have to forward to doc of the day. KC, pls advise thanks! Allergies  Allergen Reactions  . Morphine     Made high strung and have hallucination

## 2011-03-17 NOTE — Telephone Encounter (Signed)
KC, I just checked and Dr Maple Hudson has left the building.

## 2011-03-17 NOTE — Telephone Encounter (Signed)
This is Dr. Roxy Cedar pt.  Please send to him for review.  If you find that he is not available, will be happy to address.

## 2011-03-17 NOTE — Telephone Encounter (Signed)
Ok to call in levaquin 750mg one a day for 5 days.  

## 2011-03-17 NOTE — Telephone Encounter (Signed)
Spoke with pt and notified of recs per [facility]. Pt verbalized understanding. Rx was sent to pharm.

## 2011-03-19 ENCOUNTER — Encounter: Payer: Self-pay | Admitting: Internal Medicine

## 2011-03-22 LAB — BLOOD GAS, ARTERIAL
Acid-Base Excess: 0.1
Acid-Base Excess: 0.8
Acid-Base Excess: 12.4 — ABNORMAL HIGH
Acid-Base Excess: 17.1 — ABNORMAL HIGH
Acid-Base Excess: 2.1 — ABNORMAL HIGH
Acid-base deficit: 0.1
Acid-base deficit: 1.3
Acid-base deficit: 1.9
Bicarbonate: 25.6 — ABNORMAL HIGH
Bicarbonate: 26.2 — ABNORMAL HIGH
Bicarbonate: 26.7 — ABNORMAL HIGH
Bicarbonate: 26.9 — ABNORMAL HIGH
Bicarbonate: 27.8 — ABNORMAL HIGH
Bicarbonate: 37.1 — ABNORMAL HIGH
Bicarbonate: 39.4 — ABNORMAL HIGH
Bicarbonate: 40.5 — ABNORMAL HIGH
Bicarbonate: 43.3 — ABNORMAL HIGH
Drawn by: 145321
Drawn by: 15527
Drawn by: 229971
Drawn by: 235321
Drawn by: 235321
Drawn by: 275531
Drawn by: 275531
Drawn by: 283401
Drawn by: 295031
FIO2: 0.4
FIO2: 0.4
FIO2: 0.4
FIO2: 0.4
FIO2: 0.4
FIO2: 0.4
FIO2: 0.4
FIO2: 0.5
FIO2: 1
MECHVT: 0.6
MECHVT: 600
MECHVT: 600
O2 Saturation: 90
O2 Saturation: 91.5
O2 Saturation: 93.5
O2 Saturation: 94.9
O2 Saturation: 95.6
O2 Saturation: 96.6
O2 Saturation: 99.3
O2 Saturation: 99.4
PEEP: 5
PEEP: 5
PEEP: 5
PEEP: 5
PEEP: 5
PEEP: 5
PEEP: 8
Patient temperature: 37.5
Patient temperature: 98.6
Patient temperature: 98.6
Patient temperature: 98.6
Patient temperature: 98.6
Patient temperature: 98.6
Patient temperature: 98.6
Patient temperature: 99.6
Pressure support: 5
RATE: 10
RATE: 10
RATE: 10
RATE: 10
RATE: 12
RATE: 8
RATE: 8
RATE: 8
TCO2: 23.1
TCO2: 25
TCO2: 25.4
TCO2: 26.2
TCO2: 29.3
TCO2: 36.5
TCO2: 42.4
pCO2 arterial: 61.8
pCO2 arterial: 62.7
pCO2 arterial: 65.8
pCO2 arterial: 68.5
pCO2 arterial: 69.4
pCO2 arterial: 75
pH, Arterial: 7.216 — ABNORMAL LOW
pH, Arterial: 7.237 — ABNORMAL LOW
pH, Arterial: 7.25 — ABNORMAL LOW
pH, Arterial: 7.25 — ABNORMAL LOW
pH, Arterial: 7.322 — ABNORMAL LOW
pH, Arterial: 7.375
pH, Arterial: 7.477 — ABNORMAL HIGH
pH, Arterial: 7.505 — ABNORMAL HIGH
pO2, Arterial: 169 — ABNORMAL HIGH
pO2, Arterial: 52 — ABNORMAL LOW
pO2, Arterial: 68.2 — ABNORMAL LOW
pO2, Arterial: 68.3 — ABNORMAL LOW
pO2, Arterial: 75.1 — ABNORMAL LOW
pO2, Arterial: 75.9 — ABNORMAL LOW
pO2, Arterial: 76.9 — ABNORMAL LOW
pO2, Arterial: 80.2
pO2, Arterial: 90.3
pO2, Arterial: 90.5

## 2011-03-22 LAB — BASIC METABOLIC PANEL
BUN: 23
BUN: 24 — ABNORMAL HIGH
BUN: 25 — ABNORMAL HIGH
CO2: 26
CO2: 26
CO2: 28
CO2: 34 — ABNORMAL HIGH
CO2: 39 — ABNORMAL HIGH
CO2: 39 — ABNORMAL HIGH
Calcium: 7.7 — ABNORMAL LOW
Calcium: 7.9 — ABNORMAL LOW
Calcium: 9
Chloride: 101
Chloride: 106
Chloride: 97
Chloride: 98
Chloride: 98
Creatinine, Ser: 0.84
Creatinine, Ser: 0.84
Creatinine, Ser: 0.87
Creatinine, Ser: 0.93
Creatinine, Ser: 0.96
GFR calc Af Amer: >60
GFR calc Af Amer: >60
GFR calc Af Amer: >60
GFR calc Af Amer: >60
GFR calc Af Amer: >60
GFR calc Af Amer: >60
GFR calc Af Amer: >60
GFR calc non Af Amer: 58 — ABNORMAL LOW
GFR calc non Af Amer: >60
GFR calc non Af Amer: >60
GFR calc non Af Amer: >60
GFR calc non Af Amer: >60
Glucose, Bld: 151 — ABNORMAL HIGH
Glucose, Bld: 171 — ABNORMAL HIGH
Glucose, Bld: 225 — ABNORMAL HIGH
Glucose, Bld: 87
Potassium: 3.9
Potassium: 4
Potassium: 4
Potassium: 4.5
Sodium: 133 — ABNORMAL LOW
Sodium: 133 — ABNORMAL LOW
Sodium: 136
Sodium: 139

## 2011-03-22 LAB — POCT CARDIAC MARKERS
Myoglobin, poc: 192
Troponin i, poc: <0.05

## 2011-03-22 LAB — PROTIME-INR: INR: 1

## 2011-03-22 LAB — COMPREHENSIVE METABOLIC PANEL
ALT: 37 — ABNORMAL HIGH
ALT: 81 — ABNORMAL HIGH
AST: 23
AST: 24
AST: 51 — ABNORMAL HIGH
Albumin: 2.5 — ABNORMAL LOW
Alkaline Phosphatase: 36 — ABNORMAL LOW
Alkaline Phosphatase: 39
Alkaline Phosphatase: 53
BUN: 23
BUN: 24 — ABNORMAL HIGH
CO2: 30
CO2: 42 — ABNORMAL HIGH
Chloride: 104
Chloride: 113 — ABNORMAL HIGH
Chloride: 88 — ABNORMAL LOW
Creatinine, Ser: 0.84
GFR calc Af Amer: >60
GFR calc Af Amer: >60
GFR calc non Af Amer: >60
GFR calc non Af Amer: >60
Glucose, Bld: 148 — ABNORMAL HIGH
Potassium: 4.4
Potassium: 4.7
Sodium: 143
Sodium: 144
Total Bilirubin: 0.6
Total Bilirubin: 0.9
Total Protein: 4.3 — ABNORMAL LOW

## 2011-03-22 LAB — CBC
HCT: 32.3 — ABNORMAL LOW
HCT: 32.7 — ABNORMAL LOW
HCT: 34 — ABNORMAL LOW
HCT: 35.9 — ABNORMAL LOW
HCT: 36
HCT: 41.2
Hemoglobin: 10.6 — ABNORMAL LOW
Hemoglobin: 11.3 — ABNORMAL LOW
Hemoglobin: 11.5 — ABNORMAL LOW
Hemoglobin: 11.5 — ABNORMAL LOW
Hemoglobin: 13.8
MCHC: 32.2
MCHC: 32.6
MCHC: 32.6
MCHC: 33.1
MCHC: 33.4
MCHC: 33.4
MCHC: 33.7
MCV: 94
MCV: 94.2
MCV: 94.2
MCV: 94.3
MCV: 94.8
MCV: 95.1
MCV: 95.5
Platelets: 208
Platelets: 219
Platelets: 222
Platelets: 224
Platelets: 239
RBC: 3.36 — ABNORMAL LOW
RBC: 3.57 — ABNORMAL LOW
RBC: 3.71 — ABNORMAL LOW
RBC: 3.72 — ABNORMAL LOW
RBC: 3.76 — ABNORMAL LOW
RBC: 3.83 — ABNORMAL LOW
RBC: 4.12
RBC: 4.35
RDW: 12.9
RDW: 13.1
RDW: 13.2
RDW: 13.2
RDW: 13.2
RDW: 13.6
WBC: 10
WBC: 11 — ABNORMAL HIGH
WBC: 11.4 — ABNORMAL HIGH
WBC: 12.3 — ABNORMAL HIGH
WBC: 16.4 — ABNORMAL HIGH
WBC: 7.4

## 2011-03-22 LAB — GLUCOSE, CAPILLARY
Glucose-Capillary: 109 — ABNORMAL HIGH
Glucose-Capillary: 119 — ABNORMAL HIGH
Glucose-Capillary: 121 — ABNORMAL HIGH
Glucose-Capillary: 122 — ABNORMAL HIGH
Glucose-Capillary: 123 — ABNORMAL HIGH
Glucose-Capillary: 127 — ABNORMAL HIGH
Glucose-Capillary: 129 — ABNORMAL HIGH
Glucose-Capillary: 132 — ABNORMAL HIGH
Glucose-Capillary: 145 — ABNORMAL HIGH
Glucose-Capillary: 148 — ABNORMAL HIGH
Glucose-Capillary: 148 — ABNORMAL HIGH
Glucose-Capillary: 150 — ABNORMAL HIGH
Glucose-Capillary: 152 — ABNORMAL HIGH
Glucose-Capillary: 152 — ABNORMAL HIGH
Glucose-Capillary: 153 — ABNORMAL HIGH
Glucose-Capillary: 154 — ABNORMAL HIGH
Glucose-Capillary: 155 — ABNORMAL HIGH
Glucose-Capillary: 155 — ABNORMAL HIGH
Glucose-Capillary: 156 — ABNORMAL HIGH
Glucose-Capillary: 157 — ABNORMAL HIGH
Glucose-Capillary: 163 — ABNORMAL HIGH
Glucose-Capillary: 171 — ABNORMAL HIGH
Glucose-Capillary: 173 — ABNORMAL HIGH
Glucose-Capillary: 173 — ABNORMAL HIGH
Glucose-Capillary: 175 — ABNORMAL HIGH
Glucose-Capillary: 176 — ABNORMAL HIGH
Glucose-Capillary: 177 — ABNORMAL HIGH
Glucose-Capillary: 185 — ABNORMAL HIGH
Glucose-Capillary: 194 — ABNORMAL HIGH
Glucose-Capillary: 195 — ABNORMAL HIGH
Glucose-Capillary: 197 — ABNORMAL HIGH
Glucose-Capillary: 250 — ABNORMAL HIGH
Glucose-Capillary: 76
Glucose-Capillary: 79
Glucose-Capillary: 79
Glucose-Capillary: 83

## 2011-03-22 LAB — POCT I-STAT, CHEM 8
BUN: 17
Calcium, Ion: 1.3
Chloride: 91 — ABNORMAL LOW
Glucose, Bld: 224 — ABNORMAL HIGH
HCT: 49 — ABNORMAL HIGH
Potassium: 5.4 — ABNORMAL HIGH

## 2011-03-22 LAB — CARDIAC PANEL(CRET KIN+CKTOT+MB+TROPI)
CK, MB: 10.7 — ABNORMAL HIGH
CK, MB: 4.6 — ABNORMAL HIGH
Total CK: 103
Total CK: 105
Total CK: 222 — ABNORMAL HIGH
Troponin I: 0.01
Troponin I: 0.01
Troponin I: <0.01

## 2011-03-22 LAB — CULTURE, BLOOD (ROUTINE X 2): Culture: NO GROWTH

## 2011-03-22 LAB — CULTURE, BAL-QUANTITATIVE W GRAM STAIN: Gram Stain: NONE SEEN

## 2011-03-22 LAB — URINE CULTURE: Special Requests: NEGATIVE

## 2011-03-22 LAB — LEGIONELLA ANTIGEN, URINE: Legionella Antigen, Urine: NEGATIVE

## 2011-03-22 LAB — STREP PNEUMONIAE URINARY ANTIGEN: Strep Pneumo Urinary Antigen: NEGATIVE

## 2011-03-22 LAB — APTT: aPTT: 27

## 2011-03-22 LAB — B-NATRIURETIC PEPTIDE (CONVERTED LAB): Pro B Natriuretic peptide (BNP): 49

## 2011-03-22 LAB — MAGNESIUM: Magnesium: 1.7

## 2011-03-24 ENCOUNTER — Other Ambulatory Visit: Payer: Self-pay | Admitting: Pulmonary Disease

## 2011-04-02 ENCOUNTER — Telehealth: Payer: Self-pay | Admitting: Internal Medicine

## 2011-04-02 ENCOUNTER — Telehealth: Payer: Self-pay | Admitting: Critical Care Medicine

## 2011-04-02 MED ORDER — PREDNISOLONE 5 MG PO TABS: ORAL_TABLET | ORAL | Status: DC

## 2011-04-02 NOTE — Telephone Encounter (Signed)
I spoke with pt and she states she was put on prednisone when she was in the hospital. Pt states she has been taking prednisone 5 mg 1 every other day. Pt states she is now needing a refill sent to the pharmacy. Prednisone is not on pt list but per Florentina Addison she looked into this and okay to refill for pt until her OV 06/02/11 at 11:00 w/ CDY.  Pt aware rx sent to pharmacy

## 2011-04-02 NOTE — Telephone Encounter (Signed)
Error.  Wrong dr.  Antionette [name]'

## 2011-05-10 ENCOUNTER — Telehealth: Payer: Self-pay | Admitting: Internal Medicine

## 2011-05-10 NOTE — Telephone Encounter (Signed)
Caller advised request has to be signed and faxed back. She will fax again. Katie please be on the look out for same. Thanks.

## 2011-05-12 NOTE — Telephone Encounter (Signed)
Katie, do you know if this was received or returned/faxed back?

## 2011-05-12 NOTE — Telephone Encounter (Signed)
Left a detailed msg for Alcario Drought to let her know that per Florentina Addison this wasfaxed back today.

## 2011-05-12 NOTE — Telephone Encounter (Signed)
This has been faxed back as of today.

## 2011-05-12 NOTE — Telephone Encounter (Signed)
Erica @ [facility] checking on status of albuterol & iprotropium previously called about & can be reached at [phone] ext [phone].  Antionette Fairy

## 2011-05-28 ENCOUNTER — Ambulatory Visit (INDEPENDENT_AMBULATORY_CARE_PROVIDER_SITE_OTHER): Payer: Medicare Other | Admitting: Internal Medicine

## 2011-05-28 ENCOUNTER — Encounter: Payer: Self-pay | Admitting: Internal Medicine

## 2011-05-28 VITALS — BP 120/76 | HR 82 | Ht 66.0 in | Wt 203.0 lb

## 2011-05-28 DIAGNOSIS — J9610 Chronic respiratory failure, unspecified whether with hypoxia or hypercapnia: Secondary | ICD-10-CM

## 2011-05-28 DIAGNOSIS — J300 Vasomotor rhinitis: Secondary | ICD-10-CM

## 2011-05-28 DIAGNOSIS — J449 Chronic obstructive pulmonary disease, unspecified: Secondary | ICD-10-CM

## 2011-05-28 DIAGNOSIS — F329 Major depressive disorder, single episode, unspecified: Secondary | ICD-10-CM

## 2011-05-28 DIAGNOSIS — J309 Allergic rhinitis, unspecified: Secondary | ICD-10-CM

## 2011-05-28 MED ORDER — IPRATROPIUM BROMIDE 0.06 % NA SOLN: NASAL | Status: DC

## 2011-05-28 MED ORDER — SERTRALINE HCL 25 MG PO TABS: 25.0000 mg | ORAL_TABLET | Freq: Every day | ORAL | Status: DC

## 2011-05-28 MED ORDER — METHYLPREDNISOLONE ACETATE 80 MG/ML IJ SUSP
80.0000 mg | Freq: Once | INTRAMUSCULAR | Status: AC
  Administered 2011-05-28: 80 mg via INTRAMUSCULAR

## 2011-05-28 NOTE — Patient Instructions (Addendum)
Scripts sent for Zoloft and for ipratropium nasal spray  We will clarify with [facility] what documentation they need related to using your BiPAP for respiratory failure.   Depo 80

## 2011-05-28 NOTE — Progress Notes (Signed)
Subjective:    Patient ID: Meredith [name], female    DOB: [birth year], 70 y.o.   MRN: [id number]  HPI 12/01/10- COPD, chronic respiratory failure- family here Last here May 01, 2011- note reviewed. She feels she is doing well. Atrovent HFA some help, but not cost effective.  Continues daily Brovana  and budesonide- helps. Also continues theophylline. Cough varies, but not nearly as bad as it used to be.  She tolerated anesthesia for surgical removal of cyst on right neck last Fall. Aware of very easy DOE - we discussed maintaining enough activity to maintain some stamina. Remains O2 dependent.  03/15/11- 33 yoF former smoker followed for COPD, chronic respiratory failure, complicated by hx colon cancer  - family here. PCP Dr. Benedetto Goad.  Post hospital visit-hospitalized at [facility] 8/19 - 02/17/2011 with discharge diagnoses acute exacerbation of COPD, acute respiratory failure secondary to community-acquired pneumonia (no organism identified), and debilitation. She was intubated for 4 days and treated with broad-spectrum antibiotics, completing therapy finally with Avelox. She then went to a skilled nursing facility for rehabilitation and is back home now with physical therapy. ONOX - 03/09/2011 on 2L O2  showed good oxygenation with no desat. She says she is wearing BiPAP also, but we confirmed with [facility] that the overnight oxygen study was done on oxygen alone, no BiPAP at night. BiPAP was started during the hospital stay for respiratory failure and is currently 10/5. She is now on prednisone 10 mg daily. Glucose managed by Dr. Andrey Campanile. We discussed trying to taper off the prednisone if possible. We spent at least 30 minutes discussing end of life choices, how she would feel about reintubation/ventilator and resuscitation or withdrawal of care. These were not familiar issues for her and apparently there is disagreement within the family including at least one who expects divine intervention. She  asked to have a diuretic available for "this fluid retention". Her daughter and I pointed out there was no ankle edema and no obvious evidence of significant fluid overload.  05/28/11- 70 yoF former smoker followed for COPD, chronic respiratory failure, complicated by hx colon cancer  - family here. PCP Dr. Benedetto Goad.  Has had flu vaccine. She admits being depressed, cries a lot. Asks for Zoloft which helped her in the past. I agreed to get it started, contingent on following up on this issue with Dr. Andrey Campanile. Oxygen saturation falls easily with any exertion. She uses oxygen 3 L at rest. Sleeps better with her BiPAP and feels better rested. Complains of persistent watery rhinorrhea without headache or ear discomfort.  Review of Systems Constitutional:   No weight loss, night sweats,  Fevers, chills, fatigue, lassitude. HEENT:   No headaches,  Difficulty swallowing,  Tooth/dental problems,  Sore throat,                No sneezing, itching, ear ache, nasal congestion,+post nasal drip,  CV:  No chest pain,  Orthopnea, PND, swelling in lower extremities, anasarca, dizziness, palpitations GI  No heartburn, indigestion, abdominal pain, nausea, vomiting, diarrhea, change in bowel habits, loss of appetite Resp:   No excess mucus, no change in  cough,  + non-productive cough,  No coughing up of blood.  No change in color of mucus.  + wheezing.  Skin: no rash or lesions. GU:  MS:   Psych:  + change in mood or affect. + depression or anxiety.  No memory loss.     Objective:   Physical Exam General- Alert, Oriented, Affect-appropriate,  Distress- none acute;  portable oxygen 3 L per minute, obese Skin- rash-none, lesions- none, excoriation- none Lymphadenopathy- none Head- atraumatic            Eyes- Gross vision intact, PERRLA, conjunctivae clear secretions            Ears- Hearing, canals-within normal limits            Nose- Clear, no-Septal dev, mucus, polyps, erosion, perforation              Throat- Mallampati II , mucosa clear , drainage- none, tonsils- atrophic Neck- flexible , trachea midline, no stridor , thyroid nl, carotid no bruit Chest - symmetrical excursion , unlabored           Heart/CV- RRR , no murmur , no gallop  , no rub, nl s1 s2                           - JVD- none , edema- none, stasis changes- none, varices- none           Lung- distant with trace wheeze, unlabored,  wheeze- none, +raspy cough , dullness-none, rub- none           Chest wall-  Abd-  Br/ Gen/ Rectal- Not done, not indicated Extrem- cyanosis- none, clubbing, none, atrophy- none, strength- nl Neuro- grossly intact to observation

## 2011-05-30 DIAGNOSIS — F329 Major depressive disorder, single episode, unspecified: Secondary | ICD-10-CM | POA: Insufficient documentation

## 2011-05-30 DIAGNOSIS — J300 Vasomotor rhinitis: Secondary | ICD-10-CM | POA: Insufficient documentation

## 2011-05-30 DIAGNOSIS — F32A Depression, unspecified: Secondary | ICD-10-CM | POA: Insufficient documentation

## 2011-05-30 NOTE — Assessment & Plan Note (Signed)
This will be for Dr. Andrey Campanile to followup. I agreed to restart Zoloft she had used before, contingent on her discussion with Dr. Andrey Campanile.

## 2011-05-30 NOTE — Assessment & Plan Note (Addendum)
She is using oxygen at 3 L. Can increase to 4-5 L during activity if needed. BiPAP was started in hospital. We're contacting [facility] to determine what documentation is needed for BiPAP based on the diagnosis of respiratory failure.

## 2011-06-02 ENCOUNTER — Ambulatory Visit: Payer: Medicare Other | Admitting: Internal Medicine

## 2011-06-28 ENCOUNTER — Telehealth: Payer: Self-pay | Admitting: Internal Medicine

## 2011-06-28 MED ORDER — TEMAZEPAM 15 MG PO CAPS: ORAL_CAPSULE | ORAL | Status: DC

## 2011-06-28 NOTE — Telephone Encounter (Signed)
RX has been called into the pharmacy. I made pt aware not to take xanax and temazepam together's one or the other. She voiced her understanding

## 2011-06-28 NOTE — Telephone Encounter (Signed)
Pt last OV 05/28/11. Temazepam is no longer on pt's medication list. Pls advise if okay for refills. Last filled 01/21/11.

## 2011-06-28 NOTE — Telephone Encounter (Signed)
OK to refill temazepam 15 mg, 1-2 for sleep if needed, # 30, ref x 1. She also has Xanax and must not take them both at night- one or the other.

## 2011-08-05 ENCOUNTER — Telehealth: Payer: Self-pay | Admitting: Internal Medicine

## 2011-08-05 NOTE — Telephone Encounter (Signed)
ATC PT NA unable to leave VM WCB 

## 2011-08-05 NOTE — Telephone Encounter (Signed)
I spoke with pt and she states she is on zoloft and celexa and is wanting to know if this is safe to be on at the same time. She states she usually takes these together. Please advise Dr. Maple Hudson, thanks

## 2011-08-05 NOTE — Telephone Encounter (Signed)
Per CDY- these meds were not prescribed by him, she should contact her PCP.  Spoke with pt and notified her of this and she verbalized understanding. States nothing further needed.

## 2011-08-31 ENCOUNTER — Other Ambulatory Visit: Payer: Self-pay | Admitting: Internal Medicine

## 2011-09-02 NOTE — Telephone Encounter (Signed)
Please advise if okay to refill; Pt las see 12-12 and due for OV on 09-30-11. Thanks.

## 2011-09-02 NOTE — Telephone Encounter (Signed)
Ok to refill 

## 2011-09-07 ENCOUNTER — Telehealth: Payer: Self-pay | Admitting: Internal Medicine

## 2011-09-07 MED ORDER — TEMAZEPAM 15 MG PO CAPS: ORAL_CAPSULE | ORAL | Status: DC

## 2011-09-07 NOTE — Telephone Encounter (Signed)
Pt is requesting a refill on her temazepam. This was last filled 06/28/11 #30 x 1 refill. Please advise Dr. Maple Hudson, thanks

## 2011-09-07 NOTE — Telephone Encounter (Signed)
Per libby this is put in on the wrong patient. Please advise holly thanks

## 2011-09-07 NOTE — Telephone Encounter (Signed)
Addended by: Reynaldo Minium C on: 09/07/2011 02:22 PM   Modules accepted: Orders

## 2011-09-07 NOTE — Telephone Encounter (Signed)
Message left in error.  Antionette Fairy

## 2011-09-07 NOTE — Telephone Encounter (Signed)
Per CY-okay to refill x 5  

## 2011-09-07 NOTE — Telephone Encounter (Signed)
Called refill to pharmacy and pt is aware. 

## 2011-09-20 ENCOUNTER — Telehealth: Payer: Self-pay | Admitting: Internal Medicine

## 2011-09-20 MED ORDER — IPRATROPIUM BROMIDE 0.06 % NA SOLN: NASAL | Status: DC

## 2011-09-20 MED ORDER — PREDNISOLONE 5 MG PO TABS: ORAL_TABLET | ORAL | Status: DC

## 2011-09-20 MED ORDER — SERTRALINE HCL 25 MG PO TABS: 25.0000 mg | ORAL_TABLET | Freq: Every day | ORAL | Status: DC

## 2011-09-20 NOTE — Telephone Encounter (Signed)
[facility]  [address] [place] Prednisone 5 mg tablet- take 1 tablet qod #15  X6  Sertraline 25 mg tablet- take 1 tablet qd #30 X6 Ipratropium 0.06% spray - 1 spray each nostril qd #1  X6  Allergies  Allergen Reactions  . Morphine     Made high strung and have hallucination     Dr young is this ok to fill  Thank you   Selena Batten

## 2011-09-20 NOTE — Telephone Encounter (Signed)
Per Cy-okay to refill as requested. Rx's sent.

## 2011-09-28 ENCOUNTER — Telehealth: Payer: Self-pay | Admitting: Internal Medicine

## 2011-09-28 NOTE — Telephone Encounter (Signed)
LMTCB

## 2011-09-29 NOTE — Telephone Encounter (Signed)
lmomtcb x 2  

## 2011-09-29 NOTE — Telephone Encounter (Signed)
Meredith [name] came to triage and stated that the number that our office had been calling the wrong contact number - she received a call from a gentleman stating that we have been calling him "all day."  Will try to contact patient using the other contact number in her chart.  Called spoke with patient's daughter Meredith [name] who reported that pt had been having some increased SOB and cough yesterday with sats dropping to 76% with exertion.  Meredith [name] stated that pt's breathing has improved since and that pt has appt with CDY tomorrow at 1:30pm - verified.  Belinda Fisher to have pt keep appt and to seek emergency assistance if her breathing worsens before appt.  Meredith [name] verbalized her understanding and stated that nothing further is needed at this time.  Will sign off.

## 2011-09-30 ENCOUNTER — Encounter: Payer: Self-pay | Admitting: Internal Medicine

## 2011-09-30 ENCOUNTER — Ambulatory Visit (INDEPENDENT_AMBULATORY_CARE_PROVIDER_SITE_OTHER)
Admission: RE | Admit: 2011-09-30 | Discharge: 2011-09-30 | Disposition: A | Discharge status: Home / Self Care | Payer: Medicare Other | Source: Ambulatory Visit | Attending: Internal Medicine | Admitting: Internal Medicine

## 2011-09-30 ENCOUNTER — Telehealth: Payer: Self-pay | Admitting: Internal Medicine

## 2011-09-30 ENCOUNTER — Other Ambulatory Visit: Payer: Self-pay | Admitting: Internal Medicine

## 2011-09-30 ENCOUNTER — Ambulatory Visit (INDEPENDENT_AMBULATORY_CARE_PROVIDER_SITE_OTHER): Payer: Medicare Other | Admitting: Internal Medicine

## 2011-09-30 VITALS — BP 120/62 | HR 79 | Ht 66.0 in | Wt 203.8 lb

## 2011-09-30 DIAGNOSIS — E663 Overweight: Secondary | ICD-10-CM

## 2011-09-30 DIAGNOSIS — J449 Chronic obstructive pulmonary disease, unspecified: Secondary | ICD-10-CM

## 2011-09-30 DIAGNOSIS — F329 Major depressive disorder, single episode, unspecified: Secondary | ICD-10-CM

## 2011-09-30 DIAGNOSIS — J9610 Chronic respiratory failure, unspecified whether with hypoxia or hypercapnia: Secondary | ICD-10-CM

## 2011-09-30 DIAGNOSIS — F3289 Other specified depressive episodes: Secondary | ICD-10-CM

## 2011-09-30 DIAGNOSIS — J4489 Other specified chronic obstructive pulmonary disease: Secondary | ICD-10-CM

## 2011-09-30 NOTE — Telephone Encounter (Signed)
Pt aware.

## 2011-09-30 NOTE — Progress Notes (Signed)
Subjective:    Patient ID: Meredith [name], female    DOB: [birth year], 71 y.o.   MRN: [id number]  HPI 12/01/10- COPD, chronic respiratory failure- family here Last here May 01, 2011- note reviewed. She feels she is doing well. Atrovent HFA some help, but not cost effective.  Continues daily Brovana  and budesonide- helps. Also continues theophylline. Cough varies, but not nearly as bad as it used to be.  She tolerated anesthesia for surgical removal of cyst on right neck last Fall. Aware of very easy DOE - we discussed maintaining enough activity to maintain some stamina. Remains O2 dependent.  03/15/11- 17 yoF former smoker followed for COPD, chronic respiratory failure, complicated by hx colon cancer  - family here. PCP Dr. Benedetto Goad.  Post hospital visit-hospitalized at [facility] 8/19 - 02/17/2011 with discharge diagnoses acute exacerbation of COPD, acute respiratory failure secondary to community-acquired pneumonia (no organism identified), and debilitation. She was intubated for 4 days and treated with broad-spectrum antibiotics, completing therapy finally with Avelox. She then went to a skilled nursing facility for rehabilitation and is back home now with physical therapy. ONOX - 03/09/2011 on 2L O2  showed good oxygenation with no desat. She says she is wearing BiPAP also, but we confirmed with [facility] that the overnight oxygen study was done on oxygen alone, no BiPAP at night. BiPAP was started during the hospital stay for respiratory failure and is currently 10/5. She is now on prednisone 10 mg daily. Glucose managed by Dr. Andrey Campanile. We discussed trying to taper off the prednisone if possible. We spent at least 30 minutes discussing end of life choices, how she would feel about reintubation/ventilator and resuscitation or withdrawal of care. These were not familiar issues for her and apparently there is disagreement within the family including at least one who expects divine intervention. She  asked to have a diuretic available for "this fluid retention". Her daughter and I pointed out there was no ankle edema and no obvious evidence of significant fluid overload.  05/28/11- 61 yoF former smoker followed for COPD, chronic respiratory failure, complicated by hx colon cancer  - family here. PCP Dr. Benedetto Goad.  Has had flu vaccine. She admits being depressed, cries a lot. Asks for Zoloft which helped her in the past. I agreed to get it started, contingent on following up on this issue with Dr. Andrey Campanile. Oxygen saturation falls easily with any exertion. She uses oxygen 3 L at rest. Sleeps better with her BiPAP and feels better rested. Complains of persistent watery rhinorrhea without headache or ear discomfort.  09/30/11- 66 yoF former smoker followed for COPD, chronic respiratory failure, complicated by hx colon cancer  - daughter here. PCP Dr. Benedetto Goad.  Complains of dyspnea with activities of daily living on 3 L. Sits in the house and cries all the time. It is hard for family to motivate her to get up and move her to get outside. She is afraid she is retaining fluid. Denies acute events, pain, blood or fever. Continues BiPAP all night every night with oxygen.  Review of Systems-see HPI Constitutional:   No weight loss, night sweats,  Fevers, chills, fatigue, lassitude. HEENT:   No headaches,  Difficulty swallowing,  Tooth/dental problems,  Sore throat,                No sneezing, itching, ear ache, nasal congestion,+post nasal drip,  CV:  No chest pain,  Orthopnea, PND, swelling in lower extremities, anasarca, dizziness, palpitations GI  No heartburn, indigestion, abdominal pain, nausea, vomiting, diarrhea, change in bowel habits, loss of appetite Resp:   No excess mucus, no change in  cough,  + non-productive cough,  No coughing up of blood.  No change in color of mucus.  + wheezing.  Skin: no rash or lesions. GU:  MS:  Weak and deconditioned  Psych:  + change in mood or affect. +  depression or anxiety.  No memory loss.     Objective:   Physical Exam General- Alert, Oriented, Affect-appropriate, Distress- none acute;  portable oxygen 3 L per minute, obese, wheelchair Skin- rash-none, lesions- none, excoriation- none Lymphadenopathy- none Head- atraumatic            Eyes- Gross vision intact, PERRLA, conjunctivae clear secretions            Ears- Hearing, canals-within normal limits            Nose- Clear, no-Septal dev, mucus, polyps, erosion, perforation             Throat- Mallampati II , mucosa clear , drainage- none, tonsils- atrophic Neck- flexible , trachea midline, no stridor , thyroid nl, carotid no bruit Chest - symmetrical excursion , unlabored           Heart/CV- RRR , no murmur , no gallop  , no rub, nl s1 s2                           - JVD- none , edema- none, stasis changes- none, varices- none           Lung- distant with trace wheeze, unlabored,  wheeze- none, + cough with speech , dullness-none, rub- none           Chest wall-  Abd-  Br/ Gen/ Rectal- Not done, not indicated Extrem- cyanosis- none, clubbing, none, atrophy- none, strength- nl Neuro- grossly intact to observation

## 2011-09-30 NOTE — Patient Instructions (Addendum)
Order- CXR    Dx COPD  Don't forget to turn your oxygen up to 4-5 L/Minute when you are up and exerting; then turn it back down again to 3 L at rest.   Ask your doctors at [facility] about more help for depression and their advice on weight loss.   Order- referral to [facility] for weight loss guidance.

## 2011-10-01 NOTE — Progress Notes (Signed)
Quick Note:  Pt aware of results. ______ 

## 2011-10-05 DIAGNOSIS — F329 Major depressive disorder, single episode, unspecified: Secondary | ICD-10-CM | POA: Insufficient documentation

## 2011-10-05 DIAGNOSIS — F32A Depression, unspecified: Secondary | ICD-10-CM | POA: Insufficient documentation

## 2011-10-05 NOTE — Assessment & Plan Note (Signed)
Situational depression is understandable in the context of chronic lung disease. I think this personality trait came first and has been a significant factor in her prolonged smoking history. Plan-her primary physician in [place] will need to address this.

## 2011-10-05 NOTE — Assessment & Plan Note (Signed)
Interaction of her chronic lung disease, obesity, obesity hypoventilation, and deconditioning all contribute to poor exercise tolerance. Her depression and lack of willingness to motivate herself compound the problem. Plan-may increase oxygen to 45 L per minute with exertion, 3 L at rest. Schedule chest x-ray.

## 2011-10-08 ENCOUNTER — Other Ambulatory Visit: Payer: Self-pay | Admitting: Internal Medicine

## 2011-10-08 MED ORDER — ALBUTEROL SULFATE HFA 108 (90 BASE) MCG/ACT IN AERS: 2.0000 | INHALATION_SPRAY | Freq: Four times a day (QID) | RESPIRATORY_TRACT | Status: DC | PRN

## 2011-11-11 ENCOUNTER — Ambulatory Visit: Payer: Medicare Other | Admitting: *Deleted

## 2011-11-17 ENCOUNTER — Other Ambulatory Visit: Payer: Self-pay | Admitting: Family Medicine

## 2011-11-17 DIAGNOSIS — Z1231 Encounter for screening mammogram for malignant neoplasm of breast: Secondary | ICD-10-CM

## 2011-11-22 ENCOUNTER — Other Ambulatory Visit: Payer: Self-pay | Admitting: Internal Medicine

## 2011-11-23 NOTE — Telephone Encounter (Signed)
Ok to refill nystatin

## 2011-11-23 NOTE — Telephone Encounter (Signed)
OV pending 02-04-12; please advise if okay to refill. Thanks.

## 2011-11-26 ENCOUNTER — Ambulatory Visit: Payer: Medicare Other

## 2011-12-09 ENCOUNTER — Ambulatory Visit: Payer: Medicare Other

## 2011-12-24 ENCOUNTER — Other Ambulatory Visit: Payer: Self-pay | Admitting: Internal Medicine

## 2011-12-24 NOTE — Telephone Encounter (Signed)
Ok to refill 

## 2011-12-24 NOTE — Telephone Encounter (Signed)
Please advise if okay to refill as requested; medication is not on patients list.

## 2012-02-04 ENCOUNTER — Ambulatory Visit: Payer: Medicare Other | Admitting: Internal Medicine

## 2012-03-08 ENCOUNTER — Telehealth: Payer: Self-pay | Admitting: Internal Medicine

## 2012-03-08 NOTE — Telephone Encounter (Signed)
I spoke with pt and she is requesting a refill on temazepam 15 mg 1-2 tabs prn sleep. Last refilled 09/07/11 #30 x 5 refills. Pt cancelled appt 02/04/12 no pending appts. Please advise Dr. Maple Hudson thanks

## 2012-03-08 NOTE — Telephone Encounter (Signed)
Ok to refill. Please ask her to get in for f/u.

## 2012-03-09 MED ORDER — TEMAZEPAM 15 MG PO CAPS: ORAL_CAPSULE | ORAL | Status: DC

## 2012-03-09 NOTE — Telephone Encounter (Signed)
Spoke with pt and made her an appt for 04/20/12 at 3:30 pm Rx refill was called to pharm

## 2012-04-07 ENCOUNTER — Other Ambulatory Visit: Payer: Self-pay | Admitting: Internal Medicine

## 2012-04-07 NOTE — Telephone Encounter (Signed)
Please advise if okay to refill. Thanks.  

## 2012-04-09 NOTE — Telephone Encounter (Signed)
Ok to refill 

## 2012-04-20 ENCOUNTER — Ambulatory Visit: Payer: Medicare Other | Admitting: Internal Medicine

## 2012-06-05 ENCOUNTER — Encounter: Payer: Self-pay | Admitting: Internal Medicine

## 2012-06-05 ENCOUNTER — Ambulatory Visit (INDEPENDENT_AMBULATORY_CARE_PROVIDER_SITE_OTHER)
Admission: RE | Admit: 2012-06-05 | Discharge: 2012-06-05 | Disposition: A | Discharge status: Home / Self Care | Payer: Medicare Other | Source: Ambulatory Visit | Attending: Internal Medicine | Admitting: Internal Medicine

## 2012-06-05 ENCOUNTER — Ambulatory Visit (INDEPENDENT_AMBULATORY_CARE_PROVIDER_SITE_OTHER): Payer: Medicare Other | Admitting: Internal Medicine

## 2012-06-05 VITALS — BP 130/64 | HR 91 | Ht 66.0 in | Wt 215.8 lb

## 2012-06-05 DIAGNOSIS — J449 Chronic obstructive pulmonary disease, unspecified: Secondary | ICD-10-CM

## 2012-06-05 DIAGNOSIS — J9610 Chronic respiratory failure, unspecified whether with hypoxia or hypercapnia: Secondary | ICD-10-CM

## 2012-06-05 DIAGNOSIS — J4489 Other specified chronic obstructive pulmonary disease: Secondary | ICD-10-CM

## 2012-06-05 DIAGNOSIS — Z23 Encounter for immunization: Secondary | ICD-10-CM

## 2012-06-05 MED ORDER — FLUTICASONE FUROATE-VILANTEROL 100-25 MCG/INH IN AEPB: 1.0000 | INHALATION_SPRAY | Freq: Every day | RESPIRATORY_TRACT | Status: DC

## 2012-06-05 NOTE — Progress Notes (Signed)
Subjective:    Patient ID: Meredith [name], female    DOB: [birth year], 71 y.o.   MRN: [id number]  HPI 12/01/10- COPD, chronic respiratory failure- family here Last here May 01, 2011- note reviewed. She feels she is doing well. Atrovent HFA some help, but not cost effective.  Continues daily Brovana  and budesonide- helps. Also continues theophylline. Cough varies, but not nearly as bad as it used to be.  She tolerated anesthesia for surgical removal of cyst on right neck last Fall. Aware of very easy DOE - we discussed maintaining enough activity to maintain some stamina. Remains O2 dependent.  03/15/11- 65 yoF former smoker followed for COPD, chronic respiratory failure, complicated by hx colon cancer  - family here. PCP Dr. Kathryne Eriksson.  Post hospital visit-hospitalized at [facility] 8/19 - 02/17/2011 with discharge diagnoses acute exacerbation of COPD, acute respiratory failure secondary to community-acquired pneumonia (no organism identified), and debilitation. She was intubated for 4 days and treated with broad-spectrum antibiotics, completing therapy finally with Avelox. She then went to a skilled nursing facility for rehabilitation and is back home now with physical therapy. ONOX - 03/09/2011 on 2L O2  showed good oxygenation with no desat. She says she is wearing BiPAP also, but we confirmed with [facility] that the overnight oxygen study was done on oxygen alone, no BiPAP at night. BiPAP was started during the hospital stay for respiratory failure and is currently 10/5. She is now on prednisone 10 mg daily. Glucose managed by Dr. Redmond Pulling. We discussed trying to taper off the prednisone if possible. We spent at least 30 minutes discussing end of life choices, how she would feel about reintubation/ventilator and resuscitation or withdrawal of care. These were not familiar issues for her and apparently there is disagreement within the family including at least one who expects divine intervention. She  asked to have a diuretic available for "this fluid retention". Her daughter and I pointed out there was no ankle edema and no obvious evidence of significant fluid overload.  05/28/11- 7 yoF former smoker followed for COPD, chronic respiratory failure, complicated by hx colon cancer  - family here. PCP Dr. Kathryne Eriksson.  Has had flu vaccine. She admits being depressed, cries a lot. Asks for Zoloft which helped her in the past. I agreed to get it started, contingent on following up on this issue with Dr. Redmond Pulling. Oxygen saturation falls easily with any exertion. She uses oxygen 3 L at rest. Sleeps better with her BiPAP and feels better rested. Complains of persistent watery rhinorrhea without headache or ear discomfort.  09/30/11- 39 yoF former smoker followed for COPD, chronic respiratory failure, complicated by hx colon cancer  - daughter here. PCP Dr. Kathryne Eriksson.  Complains of dyspnea with activities of daily living on 3 L. Sits in the house and cries all the time. It is hard for family to motivate her to get up and move her to get outside. She is afraid she is retaining fluid. Denies acute events, pain, blood or fever. Continues BiPAP all night every night with oxygen.  06/05/12- 61 yoF former smoker followed for COPD, chronic respiratory failure, complicated by hx colon cancer  - daughter here. PCP Emmie Niemann, NP/ [facility] FOLLOWS FOR: unable to breathe; staying hot all the time; and O2 levels keep dropping. She complains of weight gain and easier dyspnea on exertion. Her daughter immediately pointed out that she makes no effort to get even the most limited exercise because she complains it makes her  short of breath. She spends all day sitting in her wheelchair with continuous oxygen at 3 L/ [facility]. Little cough. No acute issues including fever, blood or chest pain. Continues bilevel/BiPAP at night, mainly for obesity hypoventilation syndrome with probable also sleep apnea. CXR  10/01/11-reviewed IMPRESSION:  No acute findings.  Original Report Authenticated By: Reyes Ivan, M.D.   Review of Systems-see HPI Constitutional:   No weight loss, night sweats,  Fevers, chills, +fatigue, lassitude. HEENT:   No headaches,  Difficulty swallowing,  Tooth/dental problems,  Sore throat,                No sneezing, itching, ear ache, nasal congestion,+post nasal drip,  CV:  No chest pain, orthopnea, PND, swelling in lower extremities, anasarca, dizziness, palpitations GI  No heartburn, indigestion, abdominal pain, nausea, vomiting, diarrhea, change in bowel habits, loss of appetite Resp:   No excess mucus, no change in  cough,  + non-productive cough,  No coughing up of blood.  No change in color of mucus.  + wheezing.  Skin: no rash or lesions. GU:  MS:  Weak and deconditioned  Psych:  + change in mood or affect. + depression or anxiety.  No memory loss.   Objective:   Physical Exam BP 130/64  Pulse 91  Ht 5\' 6"  (1.676 m)  Wt 215 lb 12.8 oz (97.886 kg)  BMI 34.83 kg/m2  SpO2 91% General- Alert, Oriented, Affect-appropriate, Distress- none acute;  portable oxygen 3 L per minute, obese, wheelchair Skin- rash-none, lesions- none, excoriation- none Lymphadenopathy- none Head- atraumatic            Eyes- Gross vision intact, PERRLA, conjunctivae clear secretions            Ears- Hearing, canals-within normal limits            Nose- Clear, no-Septal dev, mucus, polyps, erosion, perforation             Throat- Mallampati II , mucosa clear , drainage- none, tonsils- atrophic Neck- flexible , trachea midline, no stridor , thyroid nl, carotid no bruit Chest - symmetrical excursion , unlabored           Heart/CV- RRR , no murmur , no gallop  , no rub, nl s1 s2                           - JVD- none , edema- none, stasis changes- none, varices- none           Lung- +distant with trace wheeze, unlabored,   no- cough , dullness-none, rub- none. + Pursed lips.           Chest  wall-  Abd-  Br/ Gen/ Rectal- Not done, not indicated Extrem- cyanosis- none, clubbing, none, atrophy- none, strength- nl Neuro- grossly intact to observation

## 2012-06-05 NOTE — Patient Instructions (Addendum)
Sample Breo Ellipta  1 puff then rinse mouth  One time, only once daily      See if this helps any with your shortness of breath.   Please turn your oxygen up to 4 or 5 if needed when you are active. We want you to get out of your chair and walk at least a few steps, several times every day. Try gradually to go farther and farther.  Order CXR   Dx COPD  Flu vax

## 2012-06-06 ENCOUNTER — Telehealth: Payer: Self-pay | Admitting: Internal Medicine

## 2012-06-06 NOTE — Telephone Encounter (Deleted)
         Meredith [name], [place] - 06/05/12 ','<More Detail >>       Meredith Budge, MD       Sent:  Tue June 06, 2012 8:53 AM   To:  Ronny Bacon, CMA                 Result Note     CXR- stable with COPD changes and some old scarring as reviewed in office. Nothing new.      Attached to     DG CHEST 2 VIEW (Order# [id number])            DG Chest 2 View Status:  Final result         PACS Images     Show images for DG Chest 2 View         Study Result     *RADIOLOGY REPORT*  Clinical Data: COPD, shortness of breath  CHEST - 2 VIEW  Comparison: 40/11/13  Findings: Cardiomediastinal silhouette is stable. Hyperinflation  again noted. Stable right basilar atelectasis or scarring. Bony  thorax is unremarkable. No acute infiltrate or pulmonary edema.  IMPRESSION:  No active disease. Hyperinflation. Stable right basilar  atelectasis or scarring.  Original Report Authenticated By: Natasha Mead, M.D.          Result Notes     Notes Recorded by Meredith Budge, MD on 06/06/2012 at 8:53 AM CXR- stable with COPD changes and some old scarring as reviewed in office. Nothing new.         External Result Report          External Result Report            Imaging     Imaging Information            Signed by       Signed  Date/Time    Phone  Pager    Kennieth Francois  06/05/2012  4:50 PM EST  [phone]  [phone]          Exam Information       Status  Exam Begun    Exam Ended       Final [99]  06/05/2012  4:30 PM EST  06/05/2012  4:43 PM EST          Result Notes     Notes Recorded by Meredith Budge, MD on 06/06/2012 at 8:53 AM CXR- stable with COPD changes and some old scarring as reviewed in office. Nothing new.                  Original Order            Ordered On  Ordered By       Mon Jun 05, 2012 4:21 PM  Marcellus Scott, CMA

## 2012-06-06 NOTE — Telephone Encounter (Signed)
Pt notified of cxr results per Dr. Maple Hudson and verbalized understanding.

## 2012-06-12 ENCOUNTER — Telehealth: Payer: Self-pay | Admitting: Internal Medicine

## 2012-06-12 MED ORDER — FLUTICASONE FUROATE-VILANTEROL 100-25 MCG/INH IN AEPB: 1.0000 | INHALATION_SPRAY | Freq: Every day | RESPIRATORY_TRACT | Status: DC

## 2012-06-12 NOTE — Telephone Encounter (Signed)
Per CY-if we have 1 sample to give her then its okay to give; if she is asking for RX then okay to give as well.

## 2012-06-12 NOTE — Telephone Encounter (Signed)
Pt preferred sample so sample left at front. Meredith [name], CMA

## 2012-06-12 NOTE — Telephone Encounter (Signed)
Patient Instructions     Sample Breo Ellipta 1 puff then rinse mouth One time, only once daily See if this helps any with your shortness of breath.

## 2012-06-12 NOTE — Telephone Encounter (Signed)
CDY pt last seen 06/05/12 She is asking for sample of breo Please advise if okay to provide thanks!

## 2012-06-15 ENCOUNTER — Telehealth: Payer: Self-pay | Admitting: Internal Medicine

## 2012-06-15 NOTE — Telephone Encounter (Signed)
LMTCB

## 2012-06-16 NOTE — Telephone Encounter (Signed)
Returning call can be reached at [phone].Meredith [name]

## 2012-06-16 NOTE — Telephone Encounter (Signed)
ATC patient, no answer/. Msg received that "[employer] customer not available" no option to leave msg.  WCB

## 2012-06-16 NOTE — Telephone Encounter (Signed)
Pt states her breathing has improved approx 25% on Breo but she is not able to come by to pick up the sample. Pls advise if pt should continue on this medication and if it is okay [facility] an [facility] [place]. Breo may or may not be covered by pt's insurance. Allergies  Allergen Reactions  . Morphine     Made high strung and have hallucination

## 2012-06-16 NOTE — Telephone Encounter (Signed)
Per CY-okay to RX Breo #1 inhaler(30 doses) 1 puff daily and RINSE mouth after use with 3 refills. PA will NOT be done for this medication if needed. Pt needs to understand this. If insurance will not cover then we will need to look at alternative medication.    ATC to patient-no answer and unable to leave message at this time(states that [facility] customer does not have voicemail set up at this time). Will need to try again later.

## 2012-06-17 NOTE — Assessment & Plan Note (Addendum)
That are important components of obesity and deconditioning. She is not in obvious heart failure or acute anemia. We may be seeing normal progression of her COPD. Plan-chest x-ray. Increase oxygen to 3 1/2- 5 L when active, so she can walk more as discussed.

## 2012-06-19 MED ORDER — FLUTICASONE FUROATE-VILANTEROL 100-25 MCG/INH IN AEPB: 1.0000 | INHALATION_SPRAY | Freq: Every day | RESPIRATORY_TRACT | Status: DC

## 2012-06-19 NOTE — Telephone Encounter (Signed)
ATC pt, still NA and no option to leave a msg, [facility]

## 2012-06-19 NOTE — Telephone Encounter (Signed)
Spoke with pt and notified of recs per CDY She verbalized understanding and denies any questions WCB if needs PA so we can call in something else

## 2012-06-20 ENCOUNTER — Other Ambulatory Visit: Payer: Self-pay | Admitting: *Deleted

## 2012-06-20 MED ORDER — NYSTATIN 100000 UNIT/ML MT SUSP: 200000.0000 [IU] | Freq: Four times a day (QID) | OROMUCOSAL | Status: DC

## 2012-06-20 NOTE — Telephone Encounter (Signed)
Okay to refill per CY.  nystatin 100,000 suspension  200 ML swish and swallow 5 ML QID x zero refills

## 2012-06-20 NOTE — Telephone Encounter (Signed)
Rx sent in to pharmacy. 

## 2012-06-20 NOTE — Telephone Encounter (Signed)
Refill request nystatin 100,000 suspension  200 ML swish and swallow 5 ML QID Last refilled 04/09/12 Last OV 06/05/12 Pending OV : none

## 2012-06-22 ENCOUNTER — Telehealth: Payer: Self-pay | Admitting: Internal Medicine

## 2012-06-22 MED ORDER — BUDESONIDE-FORMOTEROL FUMARATE 160-4.5 MCG/ACT IN AERO: 2.0000 | INHALATION_SPRAY | Freq: Two times a day (BID) | RESPIRATORY_TRACT | Status: DC

## 2012-06-22 NOTE — Telephone Encounter (Signed)
Pt is aware of the change in her inhalers. She verbalized understanding and says she knows how to use the inhaler. [facility] [place].

## 2012-06-22 NOTE — Telephone Encounter (Signed)
Called [facility] at [phone] to ask about alternatives for [facility]. Virgel Bouquet is non-formulary and there are no listed alternatives. I did ask about other inhalers that do not require a PA and those are:  Advair, Symbicort, Dulera, Budesonide and Asmanex. Pls advise if any of these would be appropriate for this pt. Allergies  Allergen Reactions  . Morphine     Made high strung and have hallucination

## 2012-06-22 NOTE — Telephone Encounter (Signed)
ATC x1. NA. Msg stated this is a [employer] investment banker and the voice msg system has not yet been set up. Will try to contact the pt again later.

## 2012-06-22 NOTE — Telephone Encounter (Signed)
We just tell her insurance won't cover Breo.  Instead give Symbicort 160  #1, 2 puffs then rinse, twice daily  Ref prn

## 2012-07-11 ENCOUNTER — Telehealth: Payer: Self-pay | Admitting: Internal Medicine

## 2012-07-11 MED ORDER — NYSTATIN 100000 UNIT/ML MT SUSP: 500000.0000 [IU] | Freq: Four times a day (QID) | OROMUCOSAL | Status: DC

## 2012-07-11 NOTE — Telephone Encounter (Signed)
Per CY --ok to do this.  Rx sent in, patient aware and nothing further needed at this time.

## 2012-07-11 NOTE — Telephone Encounter (Signed)
Spoke with patient, patient complaining of sore in mouth x1 week.  Patient requesting Rx for Nystatin sent in to her pharmacy.  Dr. Maple Hudson please advise if this is ok.  Last OV: 06/05/12 with 4 month follow up not scheduled yet.  Allergies  Allergen Reactions  . Morphine     Made high strung and have hallucination

## 2012-09-12 ENCOUNTER — Other Ambulatory Visit: Payer: Self-pay | Admitting: Internal Medicine

## 2012-09-12 ENCOUNTER — Telehealth: Payer: Self-pay | Admitting: Internal Medicine

## 2012-09-12 MED ORDER — TEMAZEPAM 15 MG PO CAPS: 15.0000 mg | ORAL_CAPSULE | Freq: Every evening | ORAL | Status: DC | PRN

## 2012-09-12 NOTE — Telephone Encounter (Signed)
.  spoke with pt and rx called in  Nothing further needed.

## 2012-09-12 NOTE — Telephone Encounter (Signed)
Per CY okay to refill.  

## 2012-09-12 NOTE — Telephone Encounter (Signed)
Pt is requesting a refill for temazepam 15mg , she states she takes one tablet at bedtime. Last rx written on 03-09-12 for #30 x 5 refills. Last fill was on 08-13-12. Last OV on 05-2012, rov in 4 months. Please advise if ok to refill. Carron Curie, CMA Allergies  Allergen Reactions  . Morphine     Made high strung and have hallucination

## 2012-09-18 ENCOUNTER — Other Ambulatory Visit: Payer: Self-pay | Admitting: Internal Medicine

## 2012-11-14 ENCOUNTER — Other Ambulatory Visit: Payer: Self-pay | Admitting: Internal Medicine

## 2012-11-15 ENCOUNTER — Telehealth: Payer: Self-pay | Admitting: Internal Medicine

## 2012-11-15 MED ORDER — IPRATROPIUM BROMIDE 0.06 % NA SOLN: NASAL | Status: DC

## 2012-11-15 MED ORDER — BUDESONIDE-FORMOTEROL FUMARATE 160-4.5 MCG/ACT IN AERO: 2.0000 | INHALATION_SPRAY | Freq: Two times a day (BID) | RESPIRATORY_TRACT | Status: DC

## 2012-11-15 MED ORDER — ALBUTEROL SULFATE (2.5 MG/3ML) 0.083% IN NEBU: 2.5000 mg | INHALATION_SOLUTION | Freq: Four times a day (QID) | RESPIRATORY_TRACT | Status: DC | PRN

## 2012-11-15 NOTE — Telephone Encounter (Signed)
Spoke with pharmacy requesting rx;s for neb meds,symbicort,atrovent.  rx's sent nothing further needed.

## 2012-11-17 NOTE — Telephone Encounter (Signed)
Please advise if okay to refill. Thanks.  

## 2012-11-17 NOTE — Telephone Encounter (Signed)
Ok to refill these

## 2013-01-16 ENCOUNTER — Other Ambulatory Visit: Payer: Self-pay | Admitting: Internal Medicine

## 2013-03-05 ENCOUNTER — Telehealth: Payer: Self-pay | Admitting: Internal Medicine

## 2013-03-05 MED ORDER — PREDNISONE 5 MG PO TABS: ORAL_TABLET | ORAL | Status: DC

## 2013-03-05 NOTE — Telephone Encounter (Signed)
I spoke with pt and aware RX has been sent. She is aware to keep pending appt for further refills

## 2013-03-19 ENCOUNTER — Ambulatory Visit: Payer: Medicare Other | Admitting: Internal Medicine

## 2013-03-23 ENCOUNTER — Telehealth: Payer: Self-pay | Admitting: Internal Medicine

## 2013-03-23 NOTE — Telephone Encounter (Signed)
Received fax from [facility] and i have spoke with CY and he is ok with updating the med list and fax this into [facility].  This has been done and nothing further is needed.

## 2013-03-23 NOTE — Telephone Encounter (Signed)
Called and spoke with [facility] and she stated that she needed a copy of a recent OV note to support the pts nebulizers that she is getting.  nikki is aware that pt is scheduled to come in 04/2013 to see CY but last ov was 05/2012.  nikki will send a copy of the pts updated medications that she gets and she stated that if we can update her med list and fax a copy of the updated med list back to her that this would be ok.  nikki to fax these to the triage fax.

## 2013-03-26 ENCOUNTER — Telehealth: Payer: Self-pay | Admitting: Internal Medicine

## 2013-03-26 NOTE — Telephone Encounter (Signed)
CY; I don't believe this has been done; unaware they needed this information. Are you okay with me sending the requested information? Thanks.

## 2013-03-26 NOTE — Telephone Encounter (Signed)
Please advise if this has been taken care of? thanks 

## 2013-03-26 NOTE — Telephone Encounter (Signed)
Probably done- I don't find the form

## 2013-03-27 NOTE — Telephone Encounter (Signed)
I have re-faxed the ov notes, current medication list, and rx signed by CY back to [facility].

## 2013-03-27 NOTE — Telephone Encounter (Signed)
Yes, thanks

## 2013-04-02 ENCOUNTER — Other Ambulatory Visit: Payer: Self-pay | Admitting: Internal Medicine

## 2013-04-02 NOTE — Telephone Encounter (Signed)
Ok to refill 

## 2013-04-02 NOTE — Telephone Encounter (Signed)
Please advise if okay to refill. Thanks.  

## 2013-04-04 NOTE — Telephone Encounter (Signed)
Called refill to pharmacy voicemail.  

## 2013-04-10 ENCOUNTER — Other Ambulatory Visit: Payer: Self-pay | Admitting: Internal Medicine

## 2013-04-26 ENCOUNTER — Ambulatory Visit: Payer: Medicare Other | Admitting: Internal Medicine

## 2013-06-04 ENCOUNTER — Other Ambulatory Visit: Payer: Self-pay | Admitting: Internal Medicine

## 2013-06-15 ENCOUNTER — Ambulatory Visit: Payer: Medicare Other | Admitting: Internal Medicine

## 2013-06-25 ENCOUNTER — Other Ambulatory Visit: Payer: Self-pay | Admitting: Internal Medicine

## 2013-07-12 ENCOUNTER — Ambulatory Visit (INDEPENDENT_AMBULATORY_CARE_PROVIDER_SITE_OTHER)
Admission: RE | Admit: 2013-07-12 | Discharge: 2013-07-12 | Disposition: A | Discharge status: Home / Self Care | Payer: Medicare HMO | Source: Ambulatory Visit | Attending: Internal Medicine | Admitting: Internal Medicine

## 2013-07-12 ENCOUNTER — Ambulatory Visit (INDEPENDENT_AMBULATORY_CARE_PROVIDER_SITE_OTHER): Payer: Medicare HMO | Admitting: Internal Medicine

## 2013-07-12 ENCOUNTER — Encounter: Payer: Self-pay | Admitting: Internal Medicine

## 2013-07-12 VITALS — BP 136/78 | HR 106 | Ht 66.0 in

## 2013-07-12 DIAGNOSIS — J9610 Chronic respiratory failure, unspecified whether with hypoxia or hypercapnia: Secondary | ICD-10-CM

## 2013-07-12 DIAGNOSIS — J111 Influenza due to unidentified influenza virus with other respiratory manifestations: Secondary | ICD-10-CM

## 2013-07-12 DIAGNOSIS — J441 Chronic obstructive pulmonary disease with (acute) exacerbation: Secondary | ICD-10-CM

## 2013-07-12 MED ORDER — OSELTAMIVIR PHOSPHATE 75 MG PO CAPS: 75.0000 mg | ORAL_CAPSULE | Freq: Two times a day (BID) | ORAL | Status: DC

## 2013-07-12 MED ORDER — BUDESONIDE-FORMOTEROL FUMARATE 160-4.5 MCG/ACT IN AERO: 2.0000 | INHALATION_SPRAY | Freq: Two times a day (BID) | RESPIRATORY_TRACT | Status: DC

## 2013-07-12 MED ORDER — METHYLPREDNISOLONE ACETATE 80 MG/ML IJ SUSP
80.0000 mg | Freq: Once | INTRAMUSCULAR | Status: AC
Start: 2013-07-12 — End: 2013-07-12
  Administered 2013-07-12: 80 mg via INTRAMUSCULAR

## 2013-07-12 MED ORDER — AZITHROMYCIN 250 MG PO TABS: 250.0000 mg | ORAL_TABLET | Freq: Every day | ORAL | Status: AC

## 2013-07-12 NOTE — Patient Instructions (Signed)
Scripts sent for Tamiflu and Zpak  Order- CXR   Dx acute exacerbation of COPD, flu syndrome  You can turn your oxygen up to 4-5 L/min if needed WHILE AWAKE ONLY. Turn it back down to 3L for sleep, even in a chair.  Order- [facility]- Home Health Nurse evaluation - acute exacerbation of COPD, flu syndrome, chronic respiratory failure- ? Need home assistance  Depo 80  Sample Symbicort 180    2 puffs and rinse mouth, twice daily

## 2013-07-12 NOTE — Progress Notes (Signed)
Subjective:    Patient ID: Meredith [name], female    DOB: [birth year], 73 y.o.   MRN: [id number]  HPI 12/01/10- COPD, chronic respiratory failure- family here Last here May 01, 2011- note reviewed. She feels she is doing well. Atrovent HFA some help, but not cost effective.  Continues daily Brovana  and budesonide- helps. Also continues theophylline. Cough varies, but not nearly as bad as it used to be.  She tolerated anesthesia for surgical removal of cyst on right neck last Fall. Aware of very easy DOE - we discussed maintaining enough activity to maintain some stamina. Remains O2 dependent.  03/15/11- 65 yoF former smoker followed for COPD, chronic respiratory failure, complicated by hx colon cancer  - family here. PCP Dr. Kathryne Eriksson.  Post hospital visit-hospitalized at [facility] 8/19 - 02/17/2011 with discharge diagnoses acute exacerbation of COPD, acute respiratory failure secondary to community-acquired pneumonia (no organism identified), and debilitation. She was intubated for 4 days and treated with broad-spectrum antibiotics, completing therapy finally with Avelox. She then went to a skilled nursing facility for rehabilitation and is back home now with physical therapy. ONOX - 03/09/2011 on 2L O2  showed good oxygenation with no desat. She says she is wearing BiPAP also, but we confirmed with [facility] that the overnight oxygen study was done on oxygen alone, no BiPAP at night. BiPAP was started during the hospital stay for respiratory failure and is currently 10/5. She is now on prednisone 10 mg daily. Glucose managed by Dr. Redmond Pulling. We discussed trying to taper off the prednisone if possible. We spent at least 30 minutes discussing end of life choices, how she would feel about reintubation/ventilator and resuscitation or withdrawal of care. These were not familiar issues for her and apparently there is disagreement within the family including at least one who expects divine intervention. She  asked to have a diuretic available for "this fluid retention". Her daughter and I pointed out there was no ankle edema and no obvious evidence of significant fluid overload.  05/28/11- 7 yoF former smoker followed for COPD, chronic respiratory failure, complicated by hx colon cancer  - family here. PCP Dr. Kathryne Eriksson.  Has had flu vaccine. She admits being depressed, cries a lot. Asks for Zoloft which helped her in the past. I agreed to get it started, contingent on following up on this issue with Dr. Redmond Pulling. Oxygen saturation falls easily with any exertion. She uses oxygen 3 L at rest. Sleeps better with her BiPAP and feels better rested. Complains of persistent watery rhinorrhea without headache or ear discomfort.  09/30/11- 39 yoF former smoker followed for COPD, chronic respiratory failure, complicated by hx colon cancer  - daughter here. PCP Dr. Kathryne Eriksson.  Complains of dyspnea with activities of daily living on 3 L. Sits in the house and cries all the time. It is hard for family to motivate her to get up and move her to get outside. She is afraid she is retaining fluid. Denies acute events, pain, blood or fever. Continues BiPAP all night every night with oxygen.  06/05/12- 61 yoF former smoker followed for COPD, chronic respiratory failure, complicated by hx colon cancer  - daughter here. PCP Emmie Niemann, NP/ [facility] FOLLOWS FOR: unable to breathe; staying hot all the time; and O2 levels keep dropping. She complains of weight gain and easier dyspnea on exertion. Her daughter immediately pointed out that she makes no effort to get even the most limited exercise because she complains it makes her  short of breath. She spends all day sitting in her wheelchair with continuous oxygen at 3 L/ [facility]. Little cough. No acute issues including fever, blood or chest pain. Continues bilevel/BiPAP at night, mainly for obesity hypoventilation syndrome with probable also sleep apnea. CXR  10/01/11-reviewed IMPRESSION:  No acute findings.  Original Report Authenticated By: Luretha Rued, M.D.   07/12/13- 69 yoF former smoker followed for COPD, chronic respiratory failure, complicated by hx colon cancer  - daughter here. PCP Emmie Niemann, NP/ [facility] O2 sat on arrival on 3LO2 89% Patient reports acute illness x2 or 3 days with fever, chills last night, myalgias, runny nose, short of breath. On prednisone maintenance 5 mg daily. Continues oxygen 3 L/[facility] Continues BiPAP at night, for obesity hypoventilation with probable sleep apnea  Review of Systems-see HPI Constitutional:   No weight loss, night sweats,  +Fevers, chills, +fatigue, lassitude. HEENT:   No headaches,  Difficulty swallowing,  Tooth/dental problems,  Sore throat,                No sneezing, itching, ear ache, nasal congestion,+post nasal drip,  CV:  No chest pain, orthopnea, PND, swelling in lower extremities, anasarca, dizziness, palpitations GI  No heartburn, indigestion, abdominal pain, nausea, vomiting, diarrhea, change in bowel habits, loss              of appetite Resp:   No excess mucus, no change in  cough,  + non-productive cough,  No coughing up of blood.  No              change in color of mucus.  + wheezing.  Skin: no rash or lesions. GU:  MS:  Weak and deconditioned  Psych:  + change in mood or affect. + depression or anxiety.  No memory loss.   Objective:   Physical Exam  General- Alert, Oriented, Affect-appropriate, Distress- none acute;  portable oxygen 3 L per               minute, obese, +wheelchair Skin- rash-none, lesions- none, excoriation- none Lymphadenopathy- none Head- atraumatic            Eyes- Gross vision intact, PERRLA, conjunctivae clear secretions            Ears- Hearing, canals-within normal limits            Nose- Clear, no-Septal dev, mucus, polyps, erosion, perforation             Throat- Mallampati II , mucosa clear , drainage- none, tonsils- atrophic Neck-  flexible , trachea midline, no stridor , thyroid nl, carotid no bruit Chest - symmetrical excursion , unlabored           Heart/CV- RRR , no murmur , no gallop  , no rub, nl s1 s2                           - JVD- none , edema- none, stasis changes- none, varices- none           Lung- +distant with  wheeze, unlabored,   no- cough , dullness-none, rub- none. + Pursed lips.           Chest wall-  Abd-  Br/ Gen/ Rectal- Not done, not indicated Extrem- cyanosis- none, clubbing, none, atrophy- none, strength- nl Neuro- grossly intact to observation

## 2013-07-21 ENCOUNTER — Other Ambulatory Visit: Payer: Self-pay | Admitting: Internal Medicine

## 2013-07-23 ENCOUNTER — Other Ambulatory Visit: Payer: Self-pay | Admitting: Internal Medicine

## 2013-08-07 ENCOUNTER — Telehealth: Payer: Self-pay | Admitting: Internal Medicine

## 2013-08-07 DIAGNOSIS — J441 Chronic obstructive pulmonary disease with (acute) exacerbation: Secondary | ICD-10-CM | POA: Insufficient documentation

## 2013-08-07 MED ORDER — AZITHROMYCIN 250 MG PO TABS: ORAL_TABLET | ORAL | Status: DC

## 2013-08-07 NOTE — Telephone Encounter (Signed)
Patient called requesting Zpak- new onset green productive cough, chest sore. Denies fever, sore throat.  Plan Zpak

## 2013-08-07 NOTE — Assessment & Plan Note (Signed)
Flu syndrome Plan-Tamiflu, Z-Pak, chest x-ray, supportive care

## 2013-08-07 NOTE — Assessment & Plan Note (Signed)
Continues to dependent on oxygen and BiPAP. May increase oxygen to 45 L while awake, 3 L while sleeping

## 2013-08-21 ENCOUNTER — Other Ambulatory Visit: Payer: Self-pay | Admitting: Internal Medicine

## 2013-08-22 ENCOUNTER — Telehealth: Payer: Self-pay | Admitting: Internal Medicine

## 2013-08-22 NOTE — Telephone Encounter (Signed)
lmomtcb x1 

## 2013-08-24 NOTE — Telephone Encounter (Signed)
If the patient is mobile enough, it would be bet if she could come to be seen by TP, with CXR here. If she isn't mobile enough, then ok for home CXR for dx acute exacerb of COPD

## 2013-08-24 NOTE — Telephone Encounter (Signed)
LMTCB

## 2013-08-24 NOTE — Telephone Encounter (Signed)
LMTCBx2 for Meredith [name]. I atc the pt x1 to see if we can speak with her about her symptoms. [place] Bing, CMA

## 2013-08-24 NOTE — Telephone Encounter (Signed)
Have we been back in touch with this? The note is marked "closed"

## 2013-08-24 NOTE — Telephone Encounter (Signed)
I spoke with Nira Conn, nurse with [facility],  and she states the pt was c/o having a productive cough with thick green phlegm, and diminished lung sounds on exam. I have atc the pt x 2 but no answer, no voicemail. Heather did not have any further details about the pt condition. She wanted to know if Dr. Annamaria Boots wanted to order a portable CXR through [facility] for the pt? Please advise. [place] Bing, CMA

## 2013-08-27 NOTE — Telephone Encounter (Signed)
LMTCB for Meredith [name] with [facility]

## 2013-08-30 ENCOUNTER — Telehealth: Payer: Self-pay | Admitting: Internal Medicine

## 2013-08-30 NOTE — Telephone Encounter (Signed)
I called and made Heather aware of recs. I called and made Meredith [name] aware of recs. She reports she does not need RX called in. Nothing further needed

## 2013-08-30 NOTE — Telephone Encounter (Signed)
Called spoke with [facility] nurse. She reports pt is having diminished lung sounds throughout left lung, occas prod cough w/ yellow phlem, dyspnea, fatigue. When pt walked to kitchen to get coffee O2 dropped to 77% 3 liters. Pt sat down and recovered quickly. She takes pred 10 mg 1-2 tabs daily. Not on any ABX at this. Per pt she is at baseline but wants to know if anything else can be done? Please advise Dr. Annamaria Boots thanks  Allergies  Allergen Reactions  . Morphine     Made high strung and have hallucination      Current Outpatient Prescriptions on File Prior to Visit  Medication Sig Dispense Refill  . albuterol (PROVENTIL HFA;VENTOLIN HFA) 108 (90 BASE) MCG/ACT inhaler Inhale 2 puffs into the lungs every 6 (six) hours as needed for wheezing.  1 Inhaler  11  . albuterol (PROVENTIL) (2.5 MG/3ML) 0.083% nebulizer solution Use 1 vial in nebulizer every 2-4 hours as needed for rescue only.  Shortness of breath and wheezing      . ALPRAZolam (XANAX) 1 MG tablet Take 1/2 to 1 tablet by mouth three times daily as needed      . arformoterol (BROVANA) 15 MCG/2ML NEBU Take 15 mcg by nebulization 2 (two) times daily.      Marland Kitchen azithromycin (ZITHROMAX) 250 MG tablet 2 today then one daily  6 tablet  0  . budesonide (PULMICORT) 0.25 MG/2ML nebulizer solution Take 0.25 mg by nebulization 2 (two) times daily.      Marland Kitchen glipiZIDE (GLUCOTROL XL) 2.5 MG 24 hr tablet Take 2.5 mg by mouth daily.        Marland Kitchen guaiFENesin (MUCINEX) 600 MG 12 hr tablet Take 1,200 mg by mouth daily.       Marland Kitchen ipratropium (ATROVENT) 0.02 % nebulizer solution Use in nebulizer four times daily as needed.      Marland Kitchen ipratropium (ATROVENT) 0.06 % nasal spray 1-2 sprays each nostril, 4 times daily as needed for runny nose  15 mL  6  . metFORMIN (GLUCOPHAGE) 500 MG tablet Take 1,000 mg by mouth every evening.        . nystatin (MYCOSTATIN) 100000 UNIT/ML suspension TAKE 5MLS BY MOUTH 4 TIMES A DAY  125 mL  0  . oseltamivir (TAMIFLU) 75 MG capsule Take 1  capsule (75 mg total) by mouth 2 (two) times daily.  10 capsule  0  . predniSONE (DELTASONE) 5 MG tablet TAKE 1-2 TABLETS BY MOUTH DAILY  60 tablet  0  . sertraline (ZOLOFT) 25 MG tablet Take 1 tablet (25 mg total) by mouth daily.  30 tablet  6  . SYMBICORT 160-4.5 MCG/ACT inhaler INHALE 2 PUFFS TWICE DAILY  1 Inhaler  6  . temazepam (RESTORIL) 15 MG capsule Take 1 capsule (15 mg total) by mouth at bedtime as needed for sleep.  30 capsule  5  . traZODone (DESYREL) 50 MG tablet Take 50 mg by mouth at bedtime.       . vitamin C (ASCORBIC ACID) 500 MG tablet Take 500 mg by mouth daily.         No current facility-administered medications on file prior to visit.

## 2013-08-30 NOTE — Telephone Encounter (Signed)
It doesn't sound as if there has been any acute change to suggest an antibiotic. Would increase prednisone to 20 mg daily x 4 days, then drop back to current dose. Can use O2 3l at rest and up to 5L with exertion.

## 2013-09-13 ENCOUNTER — Telehealth: Payer: Self-pay | Admitting: Internal Medicine

## 2013-09-13 NOTE — Telephone Encounter (Signed)
Called [facility], spoke with Nira Conn, RN.  Was advised nursing has d/c'd pt bc she has reached her goals.  Nira Conn feels pt's respiratory status is at baseline.  Nira Conn feels pt may need more, but they are unable to provide further nursing care given goals have been reached.  Heather suggesting to see if Dr. Annamaria Boots would like to discuss palliative care with pt.  Dr. Annamaria Boots, pls advise.  Thank you.

## 2013-09-14 NOTE — Telephone Encounter (Signed)
Spoke with Nira Conn at [facility] is aware that we will discuss care and goals when patient comes in for her ROV soon.

## 2013-09-14 NOTE — Telephone Encounter (Signed)
We can discuss care and goals of care when she comes in for ROV.

## 2013-09-17 ENCOUNTER — Other Ambulatory Visit: Payer: Self-pay | Admitting: Internal Medicine

## 2013-09-20 ENCOUNTER — Telehealth: Payer: Self-pay | Admitting: Internal Medicine

## 2013-09-20 MED ORDER — ALBUTEROL SULFATE (2.5 MG/3ML) 0.083% IN NEBU: INHALATION_SOLUTION | RESPIRATORY_TRACT | Status: DC

## 2013-09-20 MED ORDER — BUDESONIDE 0.25 MG/2ML IN SUSP: 0.2500 mg | Freq: Two times a day (BID) | RESPIRATORY_TRACT | Status: DC

## 2013-09-20 MED ORDER — ARFORMOTEROL TARTRATE 15 MCG/2ML IN NEBU: 15.0000 ug | INHALATION_SOLUTION | Freq: Two times a day (BID) | RESPIRATORY_TRACT | Status: DC

## 2013-09-20 MED ORDER — IPRATROPIUM BROMIDE 0.02 % IN SOLN: RESPIRATORY_TRACT | Status: DC

## 2013-09-20 NOTE — Telephone Encounter (Signed)
Called and spoke with [facility].  The new instruction have been given on the albuterol---since the prn order cannot be used on any rx for medicare pt.  This has been called in and they will send over new rx for CY to sign.  Nothing further is needed.

## 2013-09-20 NOTE — Telephone Encounter (Signed)
Refills are needed for Ipratropium, Brovana, Albuterol and Pulmicort. These have been sent in to [facility]. Nothing further was needed.

## 2013-09-20 NOTE — Telephone Encounter (Signed)
Meredith [name] w/ [facility], called back.  Would like to clarify Rx's for albuterol (neb med), ipratropium, brovana & budesonide.  Rx's have discrepancies w/ medicare guidelines.  Satira Anis

## 2013-09-20 NOTE — Telephone Encounter (Signed)
Spoke with Meredith [name] again. There were some issues with quantities on the rx's. They have been fixed and resent. Nothing further is needed.

## 2013-09-24 ENCOUNTER — Telehealth: Payer: Self-pay | Admitting: Internal Medicine

## 2013-09-24 ENCOUNTER — Other Ambulatory Visit: Payer: Self-pay | Admitting: Internal Medicine

## 2013-09-24 MED ORDER — ALBUTEROL SULFATE (2.5 MG/3ML) 0.083% IN NEBU: INHALATION_SOLUTION | RESPIRATORY_TRACT | Status: DC

## 2013-09-24 NOTE — Telephone Encounter (Signed)
Spoke with Debbie from [facility]  She states needing new rx sent for albuterol  Instead of 30 ml needs 30 vials  Rx was refilled  Nothing further needed

## 2013-09-26 NOTE — Telephone Encounter (Signed)
Ok to refill 

## 2013-09-26 NOTE — Telephone Encounter (Signed)
CY, please advise if okay to refill Rx. Thanks.

## 2013-10-15 ENCOUNTER — Other Ambulatory Visit: Payer: Self-pay | Admitting: Internal Medicine

## 2013-11-08 ENCOUNTER — Ambulatory Visit: Payer: Medicare HMO | Admitting: Internal Medicine

## 2013-12-10 ENCOUNTER — Other Ambulatory Visit: Payer: Self-pay | Admitting: Internal Medicine

## 2013-12-10 NOTE — Telephone Encounter (Signed)
Ok to refill 

## 2013-12-10 NOTE — Telephone Encounter (Signed)
CY, Please advise if okay to refill. Thanks.  

## 2013-12-11 ENCOUNTER — Ambulatory Visit: Payer: Medicare HMO | Admitting: Internal Medicine

## 2014-01-03 ENCOUNTER — Ambulatory Visit (INDEPENDENT_AMBULATORY_CARE_PROVIDER_SITE_OTHER): Payer: Medicare Other | Admitting: Internal Medicine

## 2014-01-03 ENCOUNTER — Encounter: Payer: Self-pay | Admitting: Internal Medicine

## 2014-01-03 VITALS — BP 120/68 | HR 88 | Ht 66.0 in

## 2014-01-03 DIAGNOSIS — J449 Chronic obstructive pulmonary disease, unspecified: Secondary | ICD-10-CM

## 2014-01-03 DIAGNOSIS — F3289 Other specified depressive episodes: Secondary | ICD-10-CM

## 2014-01-03 DIAGNOSIS — J9610 Chronic respiratory failure, unspecified whether with hypoxia or hypercapnia: Secondary | ICD-10-CM

## 2014-01-03 DIAGNOSIS — J4489 Other specified chronic obstructive pulmonary disease: Secondary | ICD-10-CM

## 2014-01-03 DIAGNOSIS — F329 Major depressive disorder, single episode, unspecified: Secondary | ICD-10-CM

## 2014-01-03 DIAGNOSIS — F32A Depression, unspecified: Secondary | ICD-10-CM

## 2014-01-03 NOTE — Progress Notes (Signed)
Subjective:    Patient ID: Meredith [name], female    DOB: [birth year], 73 y.o.   MRN: [id number]  HPI 12/01/10- COPD, chronic respiratory failure- family here Last here May 01, 2011- note reviewed. She feels she is doing well. Atrovent HFA some help, but not cost effective.  Continues daily Brovana  and budesonide- helps. Also continues theophylline. Cough varies, but not nearly as bad as it used to be.  She tolerated anesthesia for surgical removal of cyst on right neck last Fall. Aware of very easy DOE - we discussed maintaining enough activity to maintain some stamina. Remains O2 dependent.  03/15/11- 65 yoF former smoker followed for COPD, chronic respiratory failure, complicated by hx colon cancer  - family here. PCP Dr. Kathryne Eriksson.  Post hospital visit-hospitalized at [facility] 8/19 - 02/17/2011 with discharge diagnoses acute exacerbation of COPD, acute respiratory failure secondary to community-acquired pneumonia (no organism identified), and debilitation. She was intubated for 4 days and treated with broad-spectrum antibiotics, completing therapy finally with Avelox. She then went to a skilled nursing facility for rehabilitation and is back home now with physical therapy. ONOX - 03/09/2011 on 2L O2  showed good oxygenation with no desat. She says she is wearing BiPAP also, but we confirmed with [facility] that the overnight oxygen study was done on oxygen alone, no BiPAP at night. BiPAP was started during the hospital stay for respiratory failure and is currently 10/5. She is now on prednisone 10 mg daily. Glucose managed by Dr. Redmond Pulling. We discussed trying to taper off the prednisone if possible. We spent at least 30 minutes discussing end of life choices, how she would feel about reintubation/ventilator and resuscitation or withdrawal of care. These were not familiar issues for her and apparently there is disagreement within the family including at least one who expects divine intervention. She  asked to have a diuretic available for "this fluid retention". Her daughter and I pointed out there was no ankle edema and no obvious evidence of significant fluid overload.  05/28/11- 7 yoF former smoker followed for COPD, chronic respiratory failure, complicated by hx colon cancer  - family here. PCP Dr. Kathryne Eriksson.  Has had flu vaccine. She admits being depressed, cries a lot. Asks for Zoloft which helped her in the past. I agreed to get it started, contingent on following up on this issue with Dr. Redmond Pulling. Oxygen saturation falls easily with any exertion. She uses oxygen 3 L at rest. Sleeps better with her BiPAP and feels better rested. Complains of persistent watery rhinorrhea without headache or ear discomfort.  09/30/11- 39 yoF former smoker followed for COPD, chronic respiratory failure, complicated by hx colon cancer  - daughter here. PCP Dr. Kathryne Eriksson.  Complains of dyspnea with activities of daily living on 3 L. Sits in the house and cries all the time. It is hard for family to motivate her to get up and move her to get outside. She is afraid she is retaining fluid. Denies acute events, pain, blood or fever. Continues BiPAP all night every night with oxygen.  06/05/12- 61 yoF former smoker followed for COPD, chronic respiratory failure, complicated by hx colon cancer  - daughter here. PCP Emmie Niemann, NP/ [facility] FOLLOWS FOR: unable to breathe; staying hot all the time; and O2 levels keep dropping. She complains of weight gain and easier dyspnea on exertion. Her daughter immediately pointed out that she makes no effort to get even the most limited exercise because she complains it makes her  short of breath. She spends all day sitting in her wheelchair with continuous oxygen at 3 L/ [facility]. Little cough. No acute issues including fever, blood or chest pain. Continues bilevel/BiPAP at night, mainly for obesity hypoventilation syndrome with probable also sleep apnea. CXR  10/01/11-reviewed IMPRESSION:  No acute findings.  Original Report Authenticated By: Luretha Rued, M.D.   07/12/13- 52 yoF former smoker followed for COPD, chronic respiratory failure, complicated by hx colon cancer  - daughter here. PCP Emmie Niemann, NP/ [facility] O2 sat on arrival on 3LO2 89% Patient reports acute illness x2 or 3 days with fever, chills last night, myalgias, runny nose, short of breath. On prednisone maintenance 5 mg daily. Continues oxygen 3 L/[facility] Continues BiPAP at night, for obesity hypoventilation with probable sleep apnea  01/03/14- 73 yoF former smoker followed for COPD, chronic respiratory failure, OHS/BIPAP,  complicated by hx colon cancer, DM  - daughter here. PCP Emmie Niemann, NP/ [facility] FOLLOWS FOR: increased SOB and chest congestion. O2 3L continuous; prednisone maintenance 5 mg daily. She asks for portable concentrator easier for her to carry. BIPAP 10/5 used w/ O2, all night every night. Very compliant and doesn't want to sleep without it for her chronic resp failure, COPD, obesity hypoventilation and probable OSA. PCP gave antibiotic 3 weeks ago for "bacteria in lung", caused thrush.  Daughter comments on mother's depression.  Discussed steroids again- hope to reduce.  Not toxic, but some increased tightness, cough, white phlegm. CXR 06/06/14 IMPRESSION:  No active disease. Hyperinflation. Stable right basilar  atelectasis or scarring.  Original Report Authenticated By: Lahoma Crocker, M.D. CXR 07/13/13 ADDENDUM:  This addendum is given for the purpose of noting that an additional  PA view of the chest was obtained which was not available at the  time of the initial interpretation. The lung apices are better  visualized and appear clear demonstrating bullous emphysematous  disease.  Electronically Signed  By: Inge Rise M.D.  On: 07/13/2013 09:58   Review of Systems-see HPI Constitutional:   No weight loss, night sweats,  +Fevers,  chills, +fatigue, lassitude. HEENT:   No headaches,  Difficulty swallowing,  Tooth/dental problems,  Sore throat,                No sneezing, itching, ear ache, nasal congestion,+post nasal drip,  CV:  No chest pain, orthopnea, PND, swelling in lower extremities, anasarca, dizziness, palpitations GI  No heartburn, indigestion, abdominal pain, nausea, vomiting, diarrhea, change in bowel habits, loss              of appetite Resp:   +productive cough,  + non-productive cough,  No coughing up of blood.  No change in color of                  mucus.  + wheezing.  Skin: no rash or lesions. GU:  MS:  Weak and deconditioned  Psych:  + change in mood or affect. + depression or anxiety.  No memory loss.   Objective:   Physical Exam  General- Alert, Oriented, Affect-appropriate, Distress- none acute;  portable oxygen 3 L per               minute, obese, +wheelchair Skin- rash-none, lesions- none, excoriation- none Lymphadenopathy- none Head- atraumatic            Eyes- Gross vision intact, PERRLA, conjunctivae clear secretions            Ears- Hearing, canals-within normal limits  Nose- Clear, no-Septal dev, mucus, polyps, erosion, perforation             Throat- Mallampati II , mucosa clear , drainage- none, tonsils- atrophic Neck- flexible , trachea midline, no stridor , thyroid nl, carotid no bruit Chest - symmetrical excursion ,            Heart/CV- RRR , no murmur , no gallop  , no rub, nl s1 s2                           - JVD- none , edema- none, stasis changes- none, varices- none           Lung- +distant, Wheeze-none, unlabored,  Cough+deep , dullness-none, rub- none.             Chest wall-  Abd-  Br/ Gen/ Rectal- Not done, not indicated Extrem- cyanosis- none, clubbing, none, atrophy- none, strength- nl Neuro- grossly intact to observation

## 2014-01-03 NOTE — Patient Instructions (Addendum)
Order- DME [facility]- replacement BIPAP mask of choice and supplies   Dx Chronic respiratory failure, COPD, Obesity-Hypoventilation                                        Download for pressure/compliance                       [facility]- evaluate for portable O2 concentrator  3L/min    Dx COPD, Chronic respiratory failure                                        [facility]- order home health nurse eval and treat for COPD and home care needs                                          Try reducing prednisone to 5 mg every other day if you can  Designate COPD GOLD status

## 2014-01-04 ENCOUNTER — Other Ambulatory Visit: Payer: Self-pay | Admitting: Internal Medicine

## 2014-01-04 NOTE — Telephone Encounter (Signed)
Ok to refill 

## 2014-01-04 NOTE — Telephone Encounter (Signed)
CY, Please advise if okay to refill. Thanks.  

## 2014-01-04 NOTE — Assessment & Plan Note (Addendum)
Minor exacerbation. Don't think she has significant infection. Would still like to reduce maintenance steroids fr her long term welfare -discussed She qualifies for COPD Gold status to help maintain stable outpatient care. Plan- Continue BIPAP/O2, DME eval for portable O2 concentrator. Home Health Nurse eval-consider chore aide/ bath assistant, reduce prednisone to 5 mg every other day if possible. Will be needing BIPAP download for compliance pressure documentation

## 2014-01-04 NOTE — Assessment & Plan Note (Signed)
Plan- suggest PCP consider referral to Psychiatry or Psychology for depression.

## 2014-01-07 ENCOUNTER — Telehealth: Payer: Self-pay | Admitting: Internal Medicine

## 2014-01-07 NOTE — Telephone Encounter (Signed)
[place], [place].   So I did some more research but when I was looking at Dr. Janee Morn referral order I noticed that it looks like he was maybe intending to just order a replacement "mask" and supplies not a replacement unit, mask and supplies.   Can you take a look at his order and confirm please? The pt received her current unit in 2012 so she would not be eligible for a new unit anyway. We CAN provide a mask and supplies with no problem.   I'm thinking this was just a case of misinterpreting the order.   Let me know what your thoughts are.   Thanks!  Melissa     Spoke with CY; states that the order was for mask and supplies only. Sent staff message to [place] at [facility] about this. Nothing more needed at this time.

## 2014-01-07 NOTE — Telephone Encounter (Signed)
Spoke with Melissa at [facility] about this patient-states CY ordered replacement BiPAP for patient through [facility] but she does not see where [facility] provided BiPAP from hospital side back then. They are unable to provide new machine to patient-also patient did not qualify based on most recent ONO. Pt will need to go through original DME company that provided her BiPAP machine.   I researched patient's chart to find that patient was placed on BiPAP while in [facility] [address] 02-17-11(scanned documents in EPIC from 02-14-11 as well). I called Melissa back to inform her of this; she will look further into this matter and contact me asap. Melissa will send me a Staff Message or call the office to speak with me directly. Thanks.

## 2014-01-07 NOTE — Telephone Encounter (Signed)
I called and spoke with patient in reference to [place] again; she is aware that the [place] pharmacies listed are there to help deliver meds for free if need and also if she were to call here sick/acute visit that she is promised a same day appt. Pt verbalized her understanding of this once again and no further questions/concerns.

## 2014-01-25 ENCOUNTER — Other Ambulatory Visit: Payer: Self-pay | Admitting: Internal Medicine

## 2014-02-05 ENCOUNTER — Telehealth: Payer: Self-pay | Admitting: Internal Medicine

## 2014-02-05 NOTE — Telephone Encounter (Signed)
lmomtcb x1 for Meredith [name]

## 2014-02-06 ENCOUNTER — Telehealth: Payer: Self-pay | Admitting: Internal Medicine

## 2014-02-06 NOTE — Telephone Encounter (Signed)
Called and spoke to [place] with [facility]. Bishop Dublin stated they need the POC order faxed over to [facility], she stated they never received order. The order was faxed over on 01/03/14 and was confirmed by [facility] with [facility]. Informed Bishop Dublin we will refax order. Order fax. Nothing further needed.

## 2014-02-06 NOTE — Telephone Encounter (Signed)
Using the simply go and [facility] is in the process of getting the simply go mini---they have added the pt to the list to see if she can qualify for this once this is released to [facility].  Will send to CY to make him aware.  [facility] is not sure of the time line but hopefully they will get these in the next couple of months.

## 2014-02-07 NOTE — Telephone Encounter (Signed)
Noted  

## 2014-02-08 ENCOUNTER — Telehealth: Payer: Self-pay | Admitting: Internal Medicine

## 2014-02-08 DIAGNOSIS — J9611 Chronic respiratory failure with hypoxia: Secondary | ICD-10-CM

## 2014-02-08 DIAGNOSIS — J439 Emphysema, unspecified: Secondary | ICD-10-CM

## 2014-02-08 NOTE — Telephone Encounter (Signed)
Order- DME [facility]- Consult for Home Nursing to evaluate and treat. Needs referral to social services. Assess elligility for home care needs. Patient asks help bathing and home care. Dx COPD with emphysema, chronic respiratory failure with hypoxemia.

## 2014-02-08 NOTE — Telephone Encounter (Signed)
Order placed and pt is aware nothing further needed

## 2014-02-08 NOTE — Telephone Encounter (Signed)
Called spoke with pt. She is asking for assistance in her home to help with bathing, etc. She reports she is not able to do this alone anymore. She uses [facility]. Please advise Dr. Annamaria Boots thanks

## 2014-02-08 NOTE — Telephone Encounter (Signed)
Error faxed request for refill.Meredith [name]

## 2014-02-14 ENCOUNTER — Telehealth: Payer: Self-pay | Admitting: Internal Medicine

## 2014-02-14 NOTE — Telephone Encounter (Signed)
i have updated the [facility] notes in the computer to reflect the new order for oxygen.  i called and spoke with Maudie Mercury from [facility] and she will update this order in her system as well. Nothing further is needed.

## 2014-02-14 NOTE — Telephone Encounter (Signed)
Order DME [facility]- suggest oxygen order read 3.5 liters at rest, 4 liters with acitivity   Dx COPD with emphysema

## 2014-02-14 NOTE — Telephone Encounter (Signed)
Called and spoke with Maudie Mercury from [facility] and she stated that she wanted to call and make CY aware that the pt has been using her oxygen at 3.5 liters and turns up to 4 liters with activity. The order that they have for the pt was for 3 liters and able to turn up as needed with activity.    Maudie Mercury stated that the pt did walk from her bedroom to the living room today and was on 3.5 liters and her sats dropped down to 88% but after she sat on the cough for 30-60 seconds her oxygen level was back up to 90%.  Any further recs from CY.  Please advise. Thanks  Last ov--01/03/14 Next ov--07/08/14   Allergies  Allergen Reactions  . Morphine     Made high strung and have hallucination    Current Outpatient Prescriptions on File Prior to Visit  Medication Sig Dispense Refill  . albuterol (PROVENTIL HFA;VENTOLIN HFA) 108 (90 BASE) MCG/ACT inhaler Inhale 2 puffs into the lungs every 6 (six) hours as needed for wheezing.  1 Inhaler  11  . albuterol (PROVENTIL) (2.5 MG/3ML) 0.083% nebulizer solution Use 1 vial in nebulizer every 2-4 hours for  Shortness of breath and wheezing  30 vial  5  . ALPRAZolam (XANAX) 1 MG tablet Take 1/2 to 1 tablet by mouth three times daily as needed      . arformoterol (BROVANA) 15 MCG/2ML NEBU Take 2 mLs (15 mcg total) by nebulization 2 (two) times daily.  120 mL  5  . budesonide (PULMICORT) 0.25 MG/2ML nebulizer solution Take 2 mLs (0.25 mg total) by nebulization 2 (two) times daily.  120 mL  5  . glipiZIDE (GLUCOTROL XL) 2.5 MG 24 hr tablet Take 2.5 mg by mouth daily.        Marland Kitchen guaiFENesin (MUCINEX) 600 MG 12 hr tablet Take 1,200 mg by mouth daily.       Marland Kitchen ipratropium (ATROVENT) 0.02 % nebulizer solution Use in nebulizer four times daily as needed.  300 mL  5  . ipratropium (ATROVENT) 0.06 % nasal spray 1-2 sprays each nostril, 4 times daily as needed for runny nose  15 mL  6  . metFORMIN (GLUCOPHAGE) 500 MG tablet Take 1,000 mg by mouth every evening.        . Misc Natural Products  (OSTEO BI-FLEX JOINT SHIELD PO) Take 2 capsules by mouth daily.      . Multiple Vitamins-Minerals (CENTRUM SILVER ADULT 50+) TABS Take 1 tablet by mouth daily.      Marland Kitchen nystatin (MYCOSTATIN) 100000 UNIT/ML suspension TAKE 5MLS BY MOUTH 4 TIMES A DAY  125 mL  0  . predniSONE (DELTASONE) 5 MG tablet TAKE 1-2 TABLETS BY MOUTH DAILY  60 tablet  3  . sertraline (ZOLOFT) 25 MG tablet Take 1 tablet (25 mg total) by mouth daily.  30 tablet  6  . SYMBICORT 160-4.5 MCG/ACT inhaler INHALE 2 PUFFS TWICE DAILY  10.2 Inhaler  6  . temazepam (RESTORIL) 15 MG capsule Take 1 capsule (15 mg total) by mouth at bedtime as needed for sleep.  30 capsule  5  . vitamin C (ASCORBIC ACID) 500 MG tablet Take 1,000 mg by mouth daily.        No current facility-administered medications on file prior to visit.

## 2014-03-06 ENCOUNTER — Telehealth: Payer: Self-pay | Admitting: Internal Medicine

## 2014-03-06 DIAGNOSIS — J449 Chronic obstructive pulmonary disease, unspecified: Secondary | ICD-10-CM

## 2014-03-06 NOTE — Telephone Encounter (Signed)
LMTCB for Meredith [name]   

## 2014-03-07 NOTE — Telephone Encounter (Signed)
LMTCB

## 2014-03-08 ENCOUNTER — Telehealth: Payer: Self-pay | Admitting: Internal Medicine

## 2014-03-08 DIAGNOSIS — J449 Chronic obstructive pulmonary disease, unspecified: Secondary | ICD-10-CM

## 2014-03-08 NOTE — Telephone Encounter (Signed)
Spoke with patient-she is aware of order and prn use of Mucinex.

## 2014-03-08 NOTE — Telephone Encounter (Signed)
Called and spoke with  Manuela Schwartz from [facility] and she stated that the pt is using her oxygen at 3.5 liters and when she gets up she is dropping into the 84% with the oxygen at 3.5.  She stated that the pt is not bumping up her oxygen when she gets up to move around and they wanted to see if ok to increase the oxygen to 4 liters.    Also they wanted to ask about the mucinex.  Pt takes this BID daily and has been on this for a while.  Someone told her that this does not work after 14 days of being on the medication.  Should she continue the mucinex or stop this.  CY please advise. Thanks  Last ov--01/03/2014 Next ov--07/08/2014   Allergies  Allergen Reactions  . Morphine     Made high strung and have hallucination     Current Outpatient Prescriptions on File Prior to Visit  Medication Sig Dispense Refill  . albuterol (PROVENTIL HFA;VENTOLIN HFA) 108 (90 BASE) MCG/ACT inhaler Inhale 2 puffs into the lungs every 6 (six) hours as needed for wheezing.  1 Inhaler  11  . albuterol (PROVENTIL) (2.5 MG/3ML) 0.083% nebulizer solution Use 1 vial in nebulizer every 2-4 hours for  Shortness of breath and wheezing  30 vial  5  . ALPRAZolam (XANAX) 1 MG tablet Take 1/2 to 1 tablet by mouth three times daily as needed      . arformoterol (BROVANA) 15 MCG/2ML NEBU Take 2 mLs (15 mcg total) by nebulization 2 (two) times daily.  120 mL  5  . budesonide (PULMICORT) 0.25 MG/2ML nebulizer solution Take 2 mLs (0.25 mg total) by nebulization 2 (two) times daily.  120 mL  5  . glipiZIDE (GLUCOTROL XL) 2.5 MG 24 hr tablet Take 2.5 mg by mouth daily.        Marland Kitchen guaiFENesin (MUCINEX) 600 MG 12 hr tablet Take 1,200 mg by mouth daily.       Marland Kitchen ipratropium (ATROVENT) 0.02 % nebulizer solution Use in nebulizer four times daily as needed.  300 mL  5  . ipratropium (ATROVENT) 0.06 % nasal spray 1-2 sprays each nostril, 4 times daily as needed for runny nose  15 mL  6  . metFORMIN (GLUCOPHAGE) 500 MG tablet Take 1,000 mg by mouth every  evening.        . Misc Natural Products (OSTEO BI-FLEX JOINT SHIELD PO) Take 2 capsules by mouth daily.      . Multiple Vitamins-Minerals (CENTRUM SILVER ADULT 50+) TABS Take 1 tablet by mouth daily.      Marland Kitchen nystatin (MYCOSTATIN) 100000 UNIT/ML suspension TAKE 5MLS BY MOUTH 4 TIMES A DAY  125 mL  0  . predniSONE (DELTASONE) 5 MG tablet TAKE 1-2 TABLETS BY MOUTH DAILY  60 tablet  3  . sertraline (ZOLOFT) 25 MG tablet Take 1 tablet (25 mg total) by mouth daily.  30 tablet  6  . SYMBICORT 160-4.5 MCG/ACT inhaler INHALE 2 PUFFS TWICE DAILY  10.2 Inhaler  6  . temazepam (RESTORIL) 15 MG capsule Take 1 capsule (15 mg total) by mouth at bedtime as needed for sleep.  30 capsule  5  . vitamin C (ASCORBIC ACID) 500 MG tablet Take 1,000 mg by mouth daily.        No current facility-administered medications on file prior to visit.

## 2014-03-08 NOTE — Telephone Encounter (Signed)
Pt was made aware of previous phone note message.  Pt then asked if CY will give order to [facility] to continue having nurse help her at home with her baths as she is still unable to do so. [facility] nurse went out yesterday to help bathe the patient and told her that yesterday was her last visit unless we gave approval to continue. Thanks.   Pt does not need a call back if approved; only if CY denies order does patient want a call back.

## 2014-03-08 NOTE — Telephone Encounter (Signed)
Order DME [facility] increase O2 to 4L as needed to keep sat over 88%    Dx COPD  Ok to use Mucinex PRN. Try off it occasionally to see if still needed.

## 2014-03-08 NOTE — Telephone Encounter (Signed)
Order sent to [facility]  I accidentally closed encounter  LMTCB for the pt to advise on Mucinex

## 2014-03-09 NOTE — Telephone Encounter (Signed)
Ok [facility] order to continue homecare for bath assistance.  Dx COPD

## 2014-03-11 ENCOUNTER — Telehealth: Payer: Self-pay | Admitting: Internal Medicine

## 2014-03-11 NOTE — Telephone Encounter (Signed)
Ok - please let patient know

## 2014-03-11 NOTE — Telephone Encounter (Signed)
Spoke with Melissa with [facility]  She states that they received the order from [facility] for help with bath assistance  However, the pt has met all of her goals and was d/ced from [facility] services on 03/08/14  Medicare will not pay for nurse to help with bath assistance since this is not considered a skilled need  Will forward to CDY so he is aware

## 2014-03-11 NOTE — Telephone Encounter (Signed)
Spoke with the pt and notified of recs per [place]  She was very upset and wonders if there is another company that could help her with bathing assistance  I advised I will forward this to our PCC's to see if they know of another company that can help  Please advise, thanks!

## 2014-03-11 NOTE — Telephone Encounter (Signed)
Order placed. Nothing further needed. 

## 2014-03-11 NOTE — Telephone Encounter (Signed)
Pt aware i will ck with a resource and see if they know of some agency we can refer her to [name]

## 2014-03-14 NOTE — Telephone Encounter (Signed)
Yes i have talked to her and she is aware no dme will help with bathes she wil have to call social services for this [place]

## 2014-03-14 NOTE — Telephone Encounter (Signed)
Can this be closed? thanks

## 2014-03-26 ENCOUNTER — Other Ambulatory Visit: Payer: Self-pay | Admitting: Family Medicine

## 2014-03-26 ENCOUNTER — Ambulatory Visit
Admission: RE | Admit: 2014-03-26 | Discharge: 2014-03-26 | Disposition: A | Discharge status: Home / Self Care | Payer: Medicare Other | Source: Ambulatory Visit | Attending: Family Medicine | Admitting: Family Medicine

## 2014-03-26 DIAGNOSIS — M25552 Pain in left hip: Secondary | ICD-10-CM

## 2014-04-02 ENCOUNTER — Other Ambulatory Visit: Payer: Self-pay | Admitting: Internal Medicine

## 2014-04-03 ENCOUNTER — Telehealth: Payer: Self-pay | Admitting: Internal Medicine

## 2014-04-03 NOTE — Telephone Encounter (Signed)
lmomtcb x1 

## 2014-04-04 NOTE — Telephone Encounter (Signed)
LMTCB x2  

## 2014-04-05 NOTE — Telephone Encounter (Signed)
CY, please advise if okay to refill as patient called in stating she is having sores in her mouth. Thanks.

## 2014-04-05 NOTE — Telephone Encounter (Signed)
Ok to refill 

## 2014-04-05 NOTE — Telephone Encounter (Signed)
LMTCB

## 2014-04-08 MED ORDER — NYSTATIN 100000 UNIT/ML MT SUSP: OROMUCOSAL | Status: DC

## 2014-04-08 NOTE — Telephone Encounter (Signed)
Ok to refill Nystatin oral suspension as she got it last time.

## 2014-04-08 NOTE — Telephone Encounter (Signed)
lmom to make the pt aware that the nystatin has been sent to her pharmacy.  Nothing further is needed.

## 2014-04-08 NOTE — Telephone Encounter (Signed)
Pt returning call to check on status of prescript for her sore mouth, please advise.[facility] [name]

## 2014-04-08 NOTE — Telephone Encounter (Signed)
Spoke with pt, states she has white patches in her mouth X1 week.  Denies fever, nausea.  States she has had this happen before and nystatin suspension always gets rid of it.  DLV 01/03/2014, next ov 07/08/2014. [facility] [place]. Dr Annamaria Boots, please advise.  Thank you.  Allergies  Allergen Reactions  . Morphine     Made high strung and have hallucination    Current Outpatient Prescriptions on File Prior to Visit  Medication Sig Dispense Refill  . albuterol (PROVENTIL HFA;VENTOLIN HFA) 108 (90 BASE) MCG/ACT inhaler Inhale 2 puffs into the lungs every 6 (six) hours as needed for wheezing.  1 Inhaler  11  . albuterol (PROVENTIL) (2.5 MG/3ML) 0.083% nebulizer solution Use 1 vial in nebulizer every 2-4 hours for  Shortness of breath and wheezing  30 vial  5  . ALPRAZolam (XANAX) 1 MG tablet Take 1/2 to 1 tablet by mouth three times daily as needed      . arformoterol (BROVANA) 15 MCG/2ML NEBU Take 2 mLs (15 mcg total) by nebulization 2 (two) times daily.  120 mL  5  . budesonide (PULMICORT) 0.25 MG/2ML nebulizer solution Take 2 mLs (0.25 mg total) by nebulization 2 (two) times daily.  120 mL  5  . glipiZIDE (GLUCOTROL XL) 2.5 MG 24 hr tablet Take 2.5 mg by mouth daily.        Marland Kitchen guaiFENesin (MUCINEX) 600 MG 12 hr tablet Take 1,200 mg by mouth daily.       Marland Kitchen ipratropium (ATROVENT) 0.02 % nebulizer solution Use in nebulizer four times daily as needed.  300 mL  5  . ipratropium (ATROVENT) 0.06 % nasal spray 1-2 sprays each nostril, 4 times daily as needed for runny nose  15 mL  6  . metFORMIN (GLUCOPHAGE) 500 MG tablet Take 1,000 mg by mouth every evening.        . Misc Natural Products (OSTEO BI-FLEX JOINT SHIELD PO) Take 2 capsules by mouth daily.      . Multiple Vitamins-Minerals (CENTRUM SILVER ADULT 50+) TABS Take 1 tablet by mouth daily.      Marland Kitchen nystatin (MYCOSTATIN) 100000 UNIT/ML suspension TAKE 5MLS BY MOUTH 4 TIMES A DAY  125 mL  0  . predniSONE (DELTASONE) 5 MG tablet TAKE 1-2  TABLETS BY MOUTH DAILY  60 tablet  3  . sertraline (ZOLOFT) 25 MG tablet Take 1 tablet (25 mg total) by mouth daily.  30 tablet  6  . SYMBICORT 160-4.5 MCG/ACT inhaler INHALE 2 PUFFS TWICE DAILY  10.2 Inhaler  6  . temazepam (RESTORIL) 15 MG capsule Take 1 capsule (15 mg total) by mouth at bedtime as needed for sleep.  30 capsule  5  . vitamin C (ASCORBIC ACID) 500 MG tablet Take 1,000 mg by mouth daily.        No current facility-administered medications on file prior to visit.

## 2014-05-24 ENCOUNTER — Telehealth: Payer: Self-pay | Admitting: Internal Medicine

## 2014-05-24 DIAGNOSIS — J441 Chronic obstructive pulmonary disease with (acute) exacerbation: Secondary | ICD-10-CM

## 2014-05-24 NOTE — Telephone Encounter (Signed)
LMTCB x 1 

## 2014-05-27 NOTE — Telephone Encounter (Signed)
Pt states that she needs with bathing and household chores. Would like home health nurse order. Pt states that she does not want to go to a nursing home. Would like to stay at home as long as possible. Pt states that she is weak and cannot certain activities for long periods of times--pt states that she is limited to only being able to walk short distances.   Please advise Dr Annamaria Boots. Thanks.

## 2014-05-27 NOTE — Telephone Encounter (Signed)
She needs to start getting help from her PCP on these general care problems. Ok to order- DME home nursing to assess for medication and home care needs. Patient requesting bath assistance.

## 2014-05-27 NOTE — Telephone Encounter (Signed)
Called spoke with patient and discussed CY's recommendations with her.  Pt voiced her understanding and will contact her PCP going forward. Order placed to [facility] as stated below by CY.  Nothing further needed; will sign off.

## 2014-05-29 ENCOUNTER — Telehealth: Payer: Self-pay | Admitting: Internal Medicine

## 2014-05-29 NOTE — Telephone Encounter (Signed)
WHITLEY CALLING BACK [phone]

## 2014-05-29 NOTE — Telephone Encounter (Signed)
lmtcb for [employer].

## 2014-05-29 NOTE — Telephone Encounter (Signed)
Spoke with Kerri Perches, they cannot take this patient on as they are booked up. Will need to be sent to another nursing facility. Will send this back to [facility] as she was dealing with the patient referral. Please advise [facility] thanks.

## 2014-05-30 NOTE — Telephone Encounter (Signed)
Spoke with [place] at [facility] and they can not accept patient either.  Called and spoke with Jenny Reichmann at [facility] and she stated that they could accept patient and start nursing evaluation on approx Tues. 06/04/14 if that would be ok. Faxed referral to [phone] Attn: Jenny Reichmann for [facility] for nursing evaluation and medication and home care needs. Called and let patient know as well. Patient is aware. Rhonda J Cobb

## 2014-06-03 ENCOUNTER — Telehealth: Payer: Self-pay | Admitting: Internal Medicine

## 2014-06-03 NOTE — Telephone Encounter (Signed)
Called spoke with Leafy Ro w/ [facility]. I gave her #'s in pt chart and she will try to reach pt. Nothing further needed

## 2014-06-05 ENCOUNTER — Telehealth: Payer: Self-pay | Admitting: Internal Medicine

## 2014-06-05 NOTE — Telephone Encounter (Signed)
lmomtcb x1 

## 2014-06-06 NOTE — Telephone Encounter (Signed)
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

## 2014-06-06 NOTE — Telephone Encounter (Signed)
Return call.Stanley A Dalton °

## 2014-06-06 NOTE — Telephone Encounter (Signed)
lmtcb x2 

## 2014-06-06 NOTE — Telephone Encounter (Signed)
Spoke with Pandy with [facility] and given verbal orders per Dr Annamaria Boots

## 2014-06-06 NOTE — Telephone Encounter (Signed)
Called and spoke to [place]. Pandy with [name] is requesting a verbal order from Dr. Annamaria Boots for home health RN to visit pt 2 times per week for 8 weeks, for a CNA to visit pt 2 times per week for 8 weeks and for PT and social work to evaluate pt. PT and SW will call back once they have evaluated pt and will inform us of a treatment plan.  Dr. Annamaria Boots please advise.

## 2014-06-13 ENCOUNTER — Telehealth: Payer: Self-pay | Admitting: Internal Medicine

## 2014-06-13 NOTE — Telephone Encounter (Signed)
Ok to order Home PT evaluate and treat through [facility] as requested for dx chronic respiratory failure with hypoxia, COPD with emphysema. Ok to increase O2 to 5-6 L/M as needed during exertion.

## 2014-06-13 NOTE — Telephone Encounter (Signed)
Called and spoke with flora and gave the VO for the home PT.  Nothing further is needed.

## 2014-06-13 NOTE — Telephone Encounter (Signed)
Called and spoke with Meredith [name] from [facility].  She stated that she did the PT eval yesterday and will need VO for starting yesterday for 1 time per week x 2 weeks then 2 times per week for 6 weeks for PT.  She will also need an order to check her oxygen level while doing the PT.  She did this yesterday and the pt was on 4 liters and she dropped to 85% and it took her about 1 minute to get back up to 90%.  CY please advise. Thanks  Last ov--01/03/14 Next ov--07/08/14  Allergies  Allergen Reactions  . Morphine     Made high strung and have hallucination     Current Outpatient Prescriptions on File Prior to Visit  Medication Sig Dispense Refill  . albuterol (PROVENTIL HFA;VENTOLIN HFA) 108 (90 BASE) MCG/ACT inhaler Inhale 2 puffs into the lungs every 6 (six) hours as needed for wheezing. 1 Inhaler 11  . albuterol (PROVENTIL) (2.5 MG/3ML) 0.083% nebulizer solution Use 1 vial in nebulizer every 2-4 hours for  Shortness of breath and wheezing 30 vial 5  . ALPRAZolam (XANAX) 1 MG tablet Take 1/2 to 1 tablet by mouth three times daily as needed    . arformoterol (BROVANA) 15 MCG/2ML NEBU Take 2 mLs (15 mcg total) by nebulization 2 (two) times daily. 120 mL 5  . budesonide (PULMICORT) 0.25 MG/2ML nebulizer solution Take 2 mLs (0.25 mg total) by nebulization 2 (two) times daily. 120 mL 5  . glipiZIDE (GLUCOTROL XL) 2.5 MG 24 hr tablet Take 2.5 mg by mouth daily.      Marland Kitchen guaiFENesin (MUCINEX) 600 MG 12 hr tablet Take 1,200 mg by mouth daily.     Marland Kitchen ipratropium (ATROVENT) 0.02 % nebulizer solution Use in nebulizer four times daily as needed. 300 mL 5  . ipratropium (ATROVENT) 0.06 % nasal spray 1-2 sprays each nostril, 4 times daily as needed for runny nose 15 mL 6  . metFORMIN (GLUCOPHAGE) 500 MG tablet Take 1,000 mg by mouth every evening.      . Misc Natural Products (OSTEO BI-FLEX JOINT SHIELD PO) Take 2 capsules by mouth daily.    . Multiple Vitamins-Minerals (CENTRUM SILVER ADULT 50+) TABS Take 1 tablet by  mouth daily.    Marland Kitchen nystatin (MYCOSTATIN) 100000 UNIT/ML suspension TAKE 5MLS BY MOUTH 4 TIMES A DAY 125 mL 0  . predniSONE (DELTASONE) 5 MG tablet TAKE 1-2 TABLETS BY MOUTH DAILY 60 tablet 3  . sertraline (ZOLOFT) 25 MG tablet Take 1 tablet (25 mg total) by mouth daily. 30 tablet 6  . SYMBICORT 160-4.5 MCG/ACT inhaler INHALE 2 PUFFS TWICE DAILY 10.2 Inhaler 6  . temazepam (RESTORIL) 15 MG capsule Take 1 capsule (15 mg total) by mouth at bedtime as needed for sleep. 30 capsule 5  . vitamin C (ASCORBIC ACID) 500 MG tablet Take 1,000 mg by mouth daily.      No current facility-administered medications on file prior to visit.

## 2014-06-17 ENCOUNTER — Telehealth: Payer: Self-pay | Admitting: Internal Medicine

## 2014-06-17 NOTE — Telephone Encounter (Signed)
FYI on the pt from [facility] that they are doing training with pt 1 time per week for 3 weeks.  Will forward to CY as an FYI>  Nothing further is needed.

## 2014-06-19 ENCOUNTER — Telehealth: Payer: Self-pay | Admitting: Internal Medicine

## 2014-06-19 NOTE — Telephone Encounter (Signed)
Will send as an FYI to CY to make him aware. Nothing further is needed.

## 2014-06-24 ENCOUNTER — Telehealth: Payer: Self-pay | Admitting: Internal Medicine

## 2014-06-24 NOTE — Telephone Encounter (Signed)
lmtcb for pt.  

## 2014-06-25 NOTE — Telephone Encounter (Signed)
Called and spoke to [place], gave her VO for SW. Olin Hauser stated she will send over the rx for signature. Nothing further needed at this time.

## 2014-06-25 NOTE — Telephone Encounter (Signed)
Caren Macadam, calling needing verbal order for f/u social work visit.  [phone]

## 2014-06-25 NOTE — Telephone Encounter (Signed)
Called and spoke to [place]. Olin Hauser is requesting a VO for a SW f/u visit d/t pt having little socialization. Olin Hauser stated they are wanting pt to become more acclimated with her community and get pt involved with the senior center. Called and spoke to pt. Pt aware of the SW visit. Pt also stating she is unable to afford the symbicort, pt stated she will call insurance to find out what is cheaper and call back.   CY please advise if a verbal order is ok for f/u visit with social work.

## 2014-06-25 NOTE — Telephone Encounter (Signed)
Ok to order Education officer, museum consult to evaluate and treat  For dx COPD mixed type, needs socialization

## 2014-07-08 ENCOUNTER — Encounter: Payer: Self-pay | Admitting: Internal Medicine

## 2014-07-08 ENCOUNTER — Ambulatory Visit (INDEPENDENT_AMBULATORY_CARE_PROVIDER_SITE_OTHER): Payer: Medicare Other | Admitting: Internal Medicine

## 2014-07-08 VITALS — BP 126/84 | HR 94

## 2014-07-08 DIAGNOSIS — J9611 Chronic respiratory failure with hypoxia: Secondary | ICD-10-CM

## 2014-07-08 DIAGNOSIS — J449 Chronic obstructive pulmonary disease, unspecified: Secondary | ICD-10-CM

## 2014-07-08 MED ORDER — FLUTICASONE FUROATE-VILANTEROL 200-25 MCG/INH IN AEPB: 1.0000 | INHALATION_SPRAY | Freq: Every day | RESPIRATORY_TRACT | Status: DC

## 2014-07-08 NOTE — Progress Notes (Signed)
Subjective:    Patient ID: Meredith [name], female    DOB: [birth year], 74 y.o.   MRN: [id number]  HPI 12/01/10- COPD, chronic respiratory failure- family here Last here May 01, 2011- note reviewed. She feels she is doing well. Atrovent HFA some help, but not cost effective.  Continues daily Brovana  and budesonide- helps. Also continues theophylline. Cough varies, but not nearly as bad as it used to be.  She tolerated anesthesia for surgical removal of cyst on right neck last Fall. Aware of very easy DOE - we discussed maintaining enough activity to maintain some stamina. Remains O2 dependent.  03/15/11- 65 yoF former smoker followed for COPD, chronic respiratory failure, complicated by hx colon cancer  - family here. PCP Dr. Kathryne Eriksson.  Post hospital visit-hospitalized at [facility] 8/19 - 02/17/2011 with discharge diagnoses acute exacerbation of COPD, acute respiratory failure secondary to community-acquired pneumonia (no organism identified), and debilitation. She was intubated for 4 days and treated with broad-spectrum antibiotics, completing therapy finally with Avelox. She then went to a skilled nursing facility for rehabilitation and is back home now with physical therapy. ONOX - 03/09/2011 on 2L O2  showed good oxygenation with no desat. She says she is wearing BiPAP also, but we confirmed with [facility] that the overnight oxygen study was done on oxygen alone, no BiPAP at night. BiPAP was started during the hospital stay for respiratory failure and is currently 10/5. She is now on prednisone 10 mg daily. Glucose managed by Dr. Redmond Pulling. We discussed trying to taper off the prednisone if possible. We spent at least 30 minutes discussing end of life choices, how she would feel about reintubation/ventilator and resuscitation or withdrawal of care. These were not familiar issues for her and apparently there is disagreement within the family including at least one who expects divine intervention. She  asked to have a diuretic available for "this fluid retention". Her daughter and I pointed out there was no ankle edema and no obvious evidence of significant fluid overload.  05/28/11- 7 yoF former smoker followed for COPD, chronic respiratory failure, complicated by hx colon cancer  - family here. PCP Dr. Kathryne Eriksson.  Has had flu vaccine. She admits being depressed, cries a lot. Asks for Zoloft which helped her in the past. I agreed to get it started, contingent on following up on this issue with Dr. Redmond Pulling. Oxygen saturation falls easily with any exertion. She uses oxygen 3 L at rest. Sleeps better with her BiPAP and feels better rested. Complains of persistent watery rhinorrhea without headache or ear discomfort.  09/30/11- 39 yoF former smoker followed for COPD, chronic respiratory failure, complicated by hx colon cancer  - daughter here. PCP Dr. Kathryne Eriksson.  Complains of dyspnea with activities of daily living on 3 L. Sits in the house and cries all the time. It is hard for family to motivate her to get up and move her to get outside. She is afraid she is retaining fluid. Denies acute events, pain, blood or fever. Continues BiPAP all night every night with oxygen.  06/05/12- 61 yoF former smoker followed for COPD, chronic respiratory failure, complicated by hx colon cancer  - daughter here. PCP Emmie Niemann, NP/ [facility] FOLLOWS FOR: unable to breathe; staying hot all the time; and O2 levels keep dropping. She complains of weight gain and easier dyspnea on exertion. Her daughter immediately pointed out that she makes no effort to get even the most limited exercise because she complains it makes her  short of breath. She spends all day sitting in her wheelchair with continuous oxygen at 3 L/ [facility]. Little cough. No acute issues including fever, blood or chest pain. Continues bilevel/BiPAP at night, mainly for obesity hypoventilation syndrome with probable also sleep apnea. CXR  10/01/11-reviewed IMPRESSION:  No acute findings.  Original Report Authenticated By: Luretha Rued, M.D.   07/12/13- 61 yoF former smoker followed for COPD, chronic respiratory failure, complicated by hx colon cancer  - daughter here. PCP Emmie Niemann, NP/ [facility] O2 sat on arrival on 3LO2 89% Patient reports acute illness x2 or 3 days with fever, chills last night, myalgias, runny nose, short of breath. On prednisone maintenance 5 mg daily. Continues oxygen 3 L/[facility] Continues BiPAP at night, for obesity hypoventilation with probable sleep apnea  01/03/14- 73 yoF former smoker followed for COPD, chronic respiratory failure, OHS/BIPAP,  complicated by hx colon cancer, DM  - daughter here. PCP Emmie Niemann, NP/ [facility] FOLLOWS FOR: increased SOB and chest congestion. O2 3L continuous; prednisone maintenance 5 mg daily. She asks for portable concentrator easier for her to carry. BIPAP 10/5 used w/ O2, all night every night. Very compliant and doesn't want to sleep without it for her chronic resp failure, COPD, obesity hypoventilation and probable OSA. PCP gave antibiotic 3 weeks ago for "bacteria in lung", caused thrush.  Daughter comments on mother's depression.  Discussed steroids again- hope to reduce.  Not toxic, but some increased tightness, cough, white phlegm. CXR 06/06/14 IMPRESSION:  No active disease. Hyperinflation. Stable right basilar  atelectasis or scarring.  Original Report Authenticated By: Lahoma Crocker, M.D. CXR 07/13/13 ADDENDUM:  This addendum is given for the purpose of noting that an additional  PA view of the chest was obtained which was not available at the  time of the initial interpretation. The lung apices are better  visualized and appear clear demonstrating bullous emphysematous  disease.  Electronically Signed  By: Inge Rise M.D.  On: 07/13/2013 09:58  07/08/14- 22 yoF former smoker followed for COPD, chronic respiratory failure, OHS/BIPAP,   complicated by hx colon cancer, DM  - daughter here. PCP Emmie Niemann, NP/ [facility] BIPAP 10/5 used w/ O2 3.5L-4LAdvanced                  DTR here FOLLOW FOR: breathing not good; cough; congestion Denies acute changes. Dependent on as much help as she can get for ADLs. Using nebulizer about 4 times daily. Limited ambulation room to roo CXR 06/05/14 IMPRESSION: No active disease. Hyperinflation. Stable right basilar atelectasis or scarring. Original Report Authenticated By: Lahoma Crocker, M.D.  Review of Systems-see HPI Constitutional:   No weight loss, night sweats,  +Fevers, chills, +fatigue, lassitude. HEENT:   No headaches,  Difficulty swallowing,  Tooth/dental problems,  Sore throat,                No sneezing, itching, ear ache, nasal congestion,+post nasal drip,  CV:  No chest pain, orthopnea, PND, swelling in lower extremities, anasarca, dizziness, palpitations GI  No heartburn, indigestion, abdominal pain, nausea, vomiting, diarrhea, change in bowel habits, loss of appetite Resp:   No-productive cough,  + non-productive cough,  No coughing up of blood.  No change in color of  mucus.  + wheezing.  Skin: no rash or lesions. GU:  MS:  Weak and deconditioned  Psych:  + change in mood or affect. + depression or anxiety.  No memory loss.   Objective:   Physical Exam General- Alert, Oriented, Affect-appropriate,  Distress- none acute;  portable oxygen 3 L per               minute, obese, +wheelchair Skin- rash-none, lesions- none, excoriation- none Lymphadenopathy- none Head- atraumatic            Eyes- Gross vision intact, PERRLA, conjunctivae clear secretions            Ears- Hearing, canals-within normal limits            Nose- Clear, no-Septal dev, mucus, polyps, erosion, perforation             Throat- Mallampati II , mucosa clear , drainage- none, tonsils- atrophic Neck- flexible , trachea midline, no stridor , thyroid nl, carotid no bruit Chest - symmetrical excursion ,             Heart/CV- RRR , no murmur , no gallop  , no rub, nl s1 s2                           - JVD- none , edema- none, stasis changes- none, varices- none           Lung- +distant, Wheeze + mild, unlabored,  Cough-none , dullness-none, rub- none.             Chest wall-  Abd-  Br/ Gen/ Rectal- Not done, not indicated Extrem- cyanosis- none, clubbing, none, atrophy- none, strength- nl Neuro- grossly intact to observation

## 2014-07-08 NOTE — Patient Instructions (Signed)
Sample and script to try Breo Ellipta 200     1 puff then rinse mouth, once daily  Please call as needed

## 2014-07-14 NOTE — Assessment & Plan Note (Signed)
Limited activity in the home. She has asked for home health support and we have encouraged her to try to make more effort to get out of the house. Medications appropriate. Discussed Breo Ellipta as an alternative.

## 2014-07-14 NOTE — Assessment & Plan Note (Signed)
Oxygen dependent on 3 L

## 2014-07-15 ENCOUNTER — Telehealth: Payer: Self-pay | Admitting: Internal Medicine

## 2014-07-15 NOTE — Telephone Encounter (Signed)
Noted  

## 2014-07-17 ENCOUNTER — Telehealth: Payer: Self-pay | Admitting: Internal Medicine

## 2014-07-17 MED ORDER — FLUTICASONE FUROATE-VILANTEROL 200-25 MCG/INH IN AEPB: 1.0000 | INHALATION_SPRAY | Freq: Every day | RESPIRATORY_TRACT | Status: DC

## 2014-07-17 NOTE — Telephone Encounter (Signed)
Rx has been sent in. Pt is aware. 

## 2014-07-26 ENCOUNTER — Other Ambulatory Visit: Payer: Self-pay | Admitting: Internal Medicine

## 2014-07-31 ENCOUNTER — Telehealth: Payer: Self-pay | Admitting: Internal Medicine

## 2014-07-31 NOTE — Telephone Encounter (Signed)
CY please advise if ok to give the VO to recer the pt for 2 times per week for the next 9 weeks.  Thanks  Allergies  Allergen Reactions  . Morphine     Made high strung and have hallucination     Current Outpatient Prescriptions on File Prior to Visit  Medication Sig Dispense Refill  . albuterol (PROVENTIL) (2.5 MG/3ML) 0.083% nebulizer solution Use 1 vial in nebulizer every 2-4 hours for  Shortness of breath and wheezing 30 vial 5  . ALPRAZolam (XANAX) 1 MG tablet Take 1/2 to 1 tablet by mouth three times daily as needed    . arformoterol (BROVANA) 15 MCG/2ML NEBU Take 2 mLs (15 mcg total) by nebulization 2 (two) times daily. 120 mL 5  . budesonide (PULMICORT) 0.25 MG/2ML nebulizer solution Take 2 mLs (0.25 mg total) by nebulization 2 (two) times daily. 120 mL 5  . Fluticasone Furoate-Vilanterol (BREO ELLIPTA) 200-25 MCG/INH AEPB Inhale 1 puff into the lungs daily. , Rinse mouth 60 each prn  . glipiZIDE (GLUCOTROL XL) 2.5 MG 24 hr tablet Take 2.5 mg by mouth daily.      Marland Kitchen guaiFENesin (MUCINEX) 600 MG 12 hr tablet Take 1,200 mg by mouth daily.     Marland Kitchen ipratropium (ATROVENT) 0.02 % nebulizer solution Use in nebulizer four times daily as needed. 300 mL 5  . ipratropium (ATROVENT) 0.06 % nasal spray USE 1-2 SPRAYS IN EACH NOSTRIL 4 TIMES DAILY FOR RUNNY NOSE 15 mL 5  . KLOR-CON M20 20 MEQ tablet Take 20 mEq by mouth daily.  11  . Misc Natural Products (OSTEO BI-FLEX JOINT SHIELD PO) Take 2 capsules by mouth daily.    . Multiple Vitamins-Minerals (CENTRUM SILVER ADULT 50+) TABS Take 1 tablet by mouth daily.    Marland Kitchen nystatin (MYCOSTATIN) 100000 UNIT/ML suspension TAKE 5MLS BY MOUTH 4 TIMES A DAY 125 mL 0  . predniSONE (DELTASONE) 5 MG tablet TAKE 1-2 TABLETS BY MOUTH DAILY 60 tablet 3  . sertraline (ZOLOFT) 25 MG tablet Take 1 tablet (25 mg total) by mouth daily. 30 tablet 6  . temazepam (RESTORIL) 15 MG capsule Take 1 capsule (15 mg total) by mouth at bedtime as needed for sleep. 30 capsule 5  .  traMADol (ULTRAM) 50 MG tablet Take 50 mg by mouth every 12 (twelve) hours as needed.  1  . vitamin C (ASCORBIC ACID) 500 MG tablet Take 1,000 mg by mouth daily.      No current facility-administered medications on file prior to visit.

## 2014-07-31 NOTE — Telephone Encounter (Signed)
Ok as requested

## 2014-08-01 NOTE — Telephone Encounter (Signed)
Called [facility] and gave VO. Nothing further needed

## 2014-08-01 NOTE — Telephone Encounter (Signed)
lmomtcb x1 for [employer]

## 2014-08-01 NOTE — Telephone Encounter (Signed)
[phone], allison calling back for verbal order

## 2014-08-02 ENCOUNTER — Telehealth: Payer: Self-pay | Admitting: Internal Medicine

## 2014-08-02 NOTE — Telephone Encounter (Signed)
Called and spoke to [place]. Informed Bonnita Nasuti of the VO. Bonnita Nasuti verbalized understanding and denied any further questions or concerns at this time.

## 2014-08-02 NOTE — Telephone Encounter (Signed)
I thought we just extended this. Ok to give VO to extend as requested

## 2014-08-02 NOTE — Telephone Encounter (Signed)
Called and spoke to [place]. She is requesting to extend pt's home care (RN visits) for another 60 days visiting the pt 2 times per week. Bonnita Nasuti can take verbal order. (phone note from 07/31/14 was PT)  CY please advise.

## 2014-08-12 ENCOUNTER — Telehealth: Payer: Self-pay | Admitting: Internal Medicine

## 2014-08-12 NOTE — Telephone Encounter (Signed)
atc X1, no option to leave vm.  Wcb.

## 2014-08-13 NOTE — Telephone Encounter (Signed)
lmtcb for pt.  

## 2014-08-15 ENCOUNTER — Other Ambulatory Visit: Payer: Self-pay | Admitting: Internal Medicine

## 2014-08-15 NOTE — Telephone Encounter (Signed)
ATC pt on mobile number, VM box too full to leave a message. WCB ATC home phone but the number is incorrect.

## 2014-08-16 ENCOUNTER — Telehealth: Payer: Self-pay | Admitting: Internal Medicine

## 2014-08-16 NOTE — Telephone Encounter (Signed)
Called [phone] number- this number is incorrect.  Called [phone] number, vm full.

## 2014-08-19 NOTE — Telephone Encounter (Signed)
Katie, were these forms received?

## 2014-08-19 NOTE — Telephone Encounter (Signed)
Papers have been placed on CY's cart to review and sign.

## 2014-08-19 NOTE — Telephone Encounter (Signed)
Done

## 2014-08-20 NOTE — Telephone Encounter (Signed)
Were these forms given to you?

## 2014-08-20 NOTE — Telephone Encounter (Signed)
I spoke with Meredith [name] and informed her that Dr. Annamaria Boots has completed forms and they were faxed this morning. She thanks [place] and will let the appropriate person know.

## 2014-08-26 ENCOUNTER — Telehealth: Payer: Self-pay | Admitting: Internal Medicine

## 2014-08-26 DIAGNOSIS — J9611 Chronic respiratory failure with hypoxia: Secondary | ICD-10-CM

## 2014-08-26 DIAGNOSIS — J449 Chronic obstructive pulmonary disease, unspecified: Secondary | ICD-10-CM

## 2014-08-26 NOTE — Telephone Encounter (Signed)
Called spoke with pt. She is requesting more portable O2 she can carry around. She uses 4 liters O2. Please advise CDY thanks

## 2014-08-26 NOTE — Telephone Encounter (Signed)
Order- [facility] please evaluate for light portable O2 4L/ min  Dx Chronic hypoxic respiratory failure, COPD mixed type

## 2014-08-26 NOTE — Telephone Encounter (Signed)
Order has been placed and message sent to [facility]. ATC PT NA and no able to leave VM. WCB

## 2014-08-27 NOTE — Telephone Encounter (Signed)
Attempted to contact pt. No answer, voicemail if full. Will try back.

## 2014-08-28 NOTE — Telephone Encounter (Signed)
atc pt, vm full.  Wcb.

## 2014-08-29 NOTE — Telephone Encounter (Signed)
ATC pt- unable to leave voicemail (full)

## 2014-08-30 NOTE — Telephone Encounter (Signed)
We have tried to contact pt several times with no call back. Will close message per triage protocol.

## 2014-09-20 ENCOUNTER — Telehealth: Payer: Self-pay | Admitting: Internal Medicine

## 2014-09-20 NOTE — Telephone Encounter (Signed)
Spoke with Rodena Piety- home health aid with [facility], states that she was sick today and unable to visit patient in the home.  States she saw pt Mon-Thurs and will resume next week.  Per [facility] protocol they are to notify the providers when a home health visit is cancelled.  Forwarding to CY to make him aware.  Nothing further needed at this time.

## 2014-09-23 ENCOUNTER — Telehealth: Payer: Self-pay | Admitting: Internal Medicine

## 2014-09-23 NOTE — Telephone Encounter (Signed)
ATC patient-NA and unable to leave message. Will need to try again later.

## 2014-09-24 MED ORDER — NYSTATIN 100000 UNIT/ML MT SUSP: OROMUCOSAL | Status: DC

## 2014-09-24 NOTE — Telephone Encounter (Signed)
Pt returned call [phone]

## 2014-09-24 NOTE — Telephone Encounter (Signed)
Atc, na, nvm.  Wcb.

## 2014-09-24 NOTE — Telephone Encounter (Signed)
LMTC x 1  

## 2014-09-24 NOTE — Telephone Encounter (Signed)
Spoke with pt and she states that she is still having trouble with thrush off and on and this always clears it up.  Pt advised that refill was sent to pharmacy.

## 2014-09-25 NOTE — Telephone Encounter (Signed)
Attempted to call. No answer, no option to leave a message. Will need to try back later.

## 2014-09-26 MED ORDER — ALBUTEROL SULFATE HFA 108 (90 BASE) MCG/ACT IN AERS: INHALATION_SPRAY | RESPIRATORY_TRACT | Status: DC

## 2014-09-26 NOTE — Telephone Encounter (Signed)
Spoke with pt and she states she just got Symbicort refilled.  I instructed pt that this was no longer on her med list and if she is taking Budesonide in her neb she does not need Symbicort also.    She states that she would like to get an rx for Proair to keep in her purse when not around nebulizer.  Please advise if ok to send rx for Proair.

## 2014-09-26 NOTE — Telephone Encounter (Signed)
Ok to use Symbicort that she has, as well as budesonide  Ok to Rx Proair inhaler  2 puffs every 4-6 hours for rescue as needed

## 2014-09-26 NOTE — Telephone Encounter (Signed)
Spoke with pt and advised of Dr Janee Morn recommendations.  Rx for Proair sent to pharmacy.

## 2014-11-12 ENCOUNTER — Telehealth: Payer: Self-pay | Admitting: Internal Medicine

## 2014-11-12 NOTE — Telephone Encounter (Signed)
Attempted to contact pt. No answer. Voicemail is full. Will try back.

## 2014-11-13 NOTE — Telephone Encounter (Signed)
Spoke with pt. States that she needs someone to come help with things like helping her bathe. Advised her that her PCP would be the one who needs to take care of this. She states that she already called them and they okay'd this. Advised her that we don't have to do anything. Nothing further was needed.

## 2014-11-13 NOTE — Telephone Encounter (Signed)
ATC, NA and no option for leaving msg, [facility]

## 2014-11-28 ENCOUNTER — Other Ambulatory Visit: Payer: Self-pay | Admitting: Internal Medicine

## 2014-11-28 MED ORDER — ALBUTEROL SULFATE HFA 108 (90 BASE) MCG/ACT IN AERS: 2.0000 | INHALATION_SPRAY | Freq: Four times a day (QID) | RESPIRATORY_TRACT | Status: DC | PRN

## 2014-11-28 NOTE — Telephone Encounter (Signed)
Per TE, patient was to start using ProAir.. Refilled ProAir.  Nothing further needed.

## 2014-12-02 ENCOUNTER — Other Ambulatory Visit: Payer: Self-pay

## 2014-12-02 MED ORDER — ALBUTEROL SULFATE HFA 108 (90 BASE) MCG/ACT IN AERS: 2.0000 | INHALATION_SPRAY | Freq: Four times a day (QID) | RESPIRATORY_TRACT | Status: DC | PRN

## 2014-12-05 ENCOUNTER — Other Ambulatory Visit: Payer: Self-pay | Admitting: Family Medicine

## 2014-12-05 DIAGNOSIS — Z1231 Encounter for screening mammogram for malignant neoplasm of breast: Secondary | ICD-10-CM

## 2014-12-19 ENCOUNTER — Ambulatory Visit: Payer: Medicare Other

## 2014-12-30 ENCOUNTER — Telehealth: Payer: Self-pay | Admitting: Internal Medicine

## 2014-12-30 MED ORDER — PREDNISONE 5 MG PO TABS: 5.0000 mg | ORAL_TABLET | Freq: Every day | ORAL | Status: DC

## 2014-12-30 NOTE — Telephone Encounter (Signed)
Spoke with pt. She needed her prednisone refilled. I [facility]. Nothing further needed

## 2015-01-06 ENCOUNTER — Ambulatory Visit: Payer: Medicare Other | Admitting: Internal Medicine

## 2015-01-15 ENCOUNTER — Telehealth: Payer: Self-pay | Admitting: Internal Medicine

## 2015-01-15 MED ORDER — NYSTATIN 100000 UNIT/ML MT SUSP: OROMUCOSAL | Status: AC

## 2015-01-15 NOTE — Telephone Encounter (Signed)
Spoke with pt and advised that rx was sent to [facility]

## 2015-03-31 ENCOUNTER — Telehealth: Payer: Self-pay | Admitting: Internal Medicine

## 2015-03-31 MED ORDER — ARFORMOTEROL TARTRATE 15 MCG/2ML IN NEBU: 15.0000 ug | INHALATION_SOLUTION | Freq: Two times a day (BID) | RESPIRATORY_TRACT | Status: DC

## 2015-03-31 MED ORDER — ALBUTEROL SULFATE (2.5 MG/3ML) 0.083% IN NEBU: INHALATION_SOLUTION | RESPIRATORY_TRACT | Status: DC

## 2015-03-31 NOTE — Telephone Encounter (Signed)
Spoke with pt. Rx's have been sent in. Pt has upcoming appointment with CY. Nothing further was needed.

## 2015-04-14 ENCOUNTER — Telehealth: Payer: Self-pay | Admitting: Internal Medicine

## 2015-04-14 MED ORDER — ALBUTEROL SULFATE (2.5 MG/3ML) 0.083% IN NEBU: INHALATION_SOLUTION | RESPIRATORY_TRACT | Status: DC

## 2015-04-14 MED ORDER — ARFORMOTEROL TARTRATE 15 MCG/2ML IN NEBU: 15.0000 ug | INHALATION_SOLUTION | Freq: Two times a day (BID) | RESPIRATORY_TRACT | Status: DC

## 2015-04-14 NOTE — Telephone Encounter (Signed)
Spoke with [facility] pharmacist, states that Medicare won't cover an rx written "as needed" as medicare requires a minimum dosage.  rx can be changed to "as directed" and will be covered by Medicare.  Verbal ok to change this given.  Nothing further needed.

## 2015-04-15 ENCOUNTER — Telehealth: Payer: Self-pay | Admitting: Internal Medicine

## 2015-04-15 MED ORDER — IPRATROPIUM BROMIDE 0.02 % IN SOLN: RESPIRATORY_TRACT | Status: DC

## 2015-04-15 NOTE — Telephone Encounter (Signed)
Spoke with Meredith [name] from [facility]. She reports they faxed over a form for Dr. Annamaria Boots to sign regarding her ipratropium neb. I do not see this has been received. The form will be re faxed.  Await fax

## 2015-04-15 NOTE — Telephone Encounter (Signed)
Called [facility], needs ipratropium neb rx sent in.  This has been sent.  Nothing further needed.

## 2015-04-16 NOTE — Telephone Encounter (Signed)
Fax received, signed and faxed back to [facility]

## 2015-06-21 ENCOUNTER — Other Ambulatory Visit: Payer: Self-pay | Admitting: Internal Medicine

## 2015-07-14 ENCOUNTER — Encounter: Payer: Self-pay | Admitting: Internal Medicine

## 2015-07-14 ENCOUNTER — Ambulatory Visit (INDEPENDENT_AMBULATORY_CARE_PROVIDER_SITE_OTHER): Payer: Medicare HMO | Admitting: Internal Medicine

## 2015-07-14 VITALS — BP 124/72 | HR 78

## 2015-07-14 DIAGNOSIS — Z23 Encounter for immunization: Secondary | ICD-10-CM

## 2015-07-14 DIAGNOSIS — G4700 Insomnia, unspecified: Secondary | ICD-10-CM

## 2015-07-14 DIAGNOSIS — F32A Depression, unspecified: Secondary | ICD-10-CM

## 2015-07-14 DIAGNOSIS — G4733 Obstructive sleep apnea (adult) (pediatric): Secondary | ICD-10-CM

## 2015-07-14 DIAGNOSIS — J449 Chronic obstructive pulmonary disease, unspecified: Secondary | ICD-10-CM

## 2015-07-14 DIAGNOSIS — J9611 Chronic respiratory failure with hypoxia: Secondary | ICD-10-CM | POA: Diagnosis not present

## 2015-07-14 DIAGNOSIS — F329 Major depressive disorder, single episode, unspecified: Secondary | ICD-10-CM

## 2015-07-14 MED ORDER — DOXEPIN HCL 3 MG PO TABS: ORAL_TABLET | ORAL | Status: DC

## 2015-07-14 NOTE — Patient Instructions (Addendum)
Order- [facility]-  Replacement CPAP mask of choice and supplies    DX  OSA   Home Health Nurse evaluate and treat for chronic respiratory failure, assess for home health assistance aide  Recommend you discuss eldercare assistance resources with your primary care giver for help in your home or in a nursing home.  Script for Silenor 3 mg for sleep

## 2015-07-14 NOTE — Progress Notes (Signed)
Subjective:    Patient ID: Meredith [name], female    DOB: [birth year], 75 y.o.   MRN: [id number]  HPI 12/01/10- COPD, chronic respiratory failure- family here Last here May 01, 2011- note reviewed. She feels she is doing well. Atrovent HFA some help, but not cost effective.  Continues daily Brovana  and budesonide- helps. Also continues theophylline. Cough varies, but not nearly as bad as it used to be.  She tolerated anesthesia for surgical removal of cyst on right neck last Fall. Aware of very easy DOE - we discussed maintaining enough activity to maintain some stamina. Remains O2 dependent.  03/15/11- 65 yoF former smoker followed for COPD, chronic respiratory failure, complicated by hx colon cancer  - family here. PCP Dr. Kathryne Eriksson.  Post hospital visit-hospitalized at [facility] 8/19 - 02/17/2011 with discharge diagnoses acute exacerbation of COPD, acute respiratory failure secondary to community-acquired pneumonia (no organism identified), and debilitation. She was intubated for 4 days and treated with broad-spectrum antibiotics, completing therapy finally with Avelox. She then went to a skilled nursing facility for rehabilitation and is back home now with physical therapy. ONOX - 03/09/2011 on 2L O2  showed good oxygenation with no desat. She says she is wearing BiPAP also, but we confirmed with [facility] that the overnight oxygen study was done on oxygen alone, no BiPAP at night. BiPAP was started during the hospital stay for respiratory failure and is currently 10/5. She is now on prednisone 10 mg daily. Glucose managed by Dr. Redmond Pulling. We discussed trying to taper off the prednisone if possible. We spent at least 30 minutes discussing end of life choices, how she would feel about reintubation/ventilator and resuscitation or withdrawal of care. These were not familiar issues for her and apparently there is disagreement within the family including at least one who expects divine intervention. She  asked to have a diuretic available for "this fluid retention". Her daughter and I pointed out there was no ankle edema and no obvious evidence of significant fluid overload.  05/28/11- 7 yoF former smoker followed for COPD, chronic respiratory failure, complicated by hx colon cancer  - family here. PCP Dr. Kathryne Eriksson.  Has had flu vaccine. She admits being depressed, cries a lot. Asks for Zoloft which helped her in the past. I agreed to get it started, contingent on following up on this issue with Dr. Redmond Pulling. Oxygen saturation falls easily with any exertion. She uses oxygen 3 L at rest. Sleeps better with her BiPAP and feels better rested. Complains of persistent watery rhinorrhea without headache or ear discomfort.  09/30/11- 39 yoF former smoker followed for COPD, chronic respiratory failure, complicated by hx colon cancer  - daughter here. PCP Dr. Kathryne Eriksson.  Complains of dyspnea with activities of daily living on 3 L. Sits in the house and cries all the time. It is hard for family to motivate her to get up and move her to get outside. She is afraid she is retaining fluid. Denies acute events, pain, blood or fever. Continues BiPAP all night every night with oxygen.  06/05/12- 61 yoF former smoker followed for COPD, chronic respiratory failure, complicated by hx colon cancer  - daughter here. PCP Emmie Niemann, NP/ [facility] FOLLOWS FOR: unable to breathe; staying hot all the time; and O2 levels keep dropping. She complains of weight gain and easier dyspnea on exertion. Her daughter immediately pointed out that she makes no effort to get even the most limited exercise because she complains it makes her  short of breath. She spends all day sitting in her wheelchair with continuous oxygen at 3 L/ [facility]. Little cough. No acute issues including fever, blood or chest pain. Continues bilevel/BiPAP at night, mainly for obesity hypoventilation syndrome with probable also sleep apnea. CXR  10/01/11-reviewed IMPRESSION:  No acute findings.  Original Report Authenticated By: Luretha Rued, M.D.   07/12/13- 46 yoF former smoker followed for COPD, chronic respiratory failure, complicated by hx colon cancer  - daughter here. PCP Emmie Niemann, NP/ [facility] O2 sat on arrival on 3LO2 89% Patient reports acute illness x2 or 3 days with fever, chills last night, myalgias, runny nose, short of breath. On prednisone maintenance 5 mg daily. Continues oxygen 3 L/[facility] Continues BiPAP at night, for obesity hypoventilation with probable sleep apnea  01/03/14- 73 yoF former smoker followed for COPD, chronic respiratory failure, OHS/BIPAP,  complicated by hx colon cancer, DM  - daughter here. PCP Emmie Niemann, NP/ [facility] FOLLOWS FOR: increased SOB and chest congestion. O2 3L continuous; prednisone maintenance 5 mg daily. She asks for portable concentrator easier for her to carry. BIPAP 10/5 used w/ O2, all night every night. Very compliant and doesn't want to sleep without it for her chronic resp failure, COPD, obesity hypoventilation and probable OSA. PCP gave antibiotic 3 weeks ago for "bacteria in lung", caused thrush.  Daughter comments on mother's depression.  Discussed steroids again- hope to reduce.  Not toxic, but some increased tightness, cough, white phlegm. CXR 06/06/14 IMPRESSION:  No active disease. Hyperinflation. Stable right basilar  atelectasis or scarring.  Original Report Authenticated By: Lahoma Crocker, M.D. CXR 07/13/13 ADDENDUM:  This addendum is given for the purpose of noting that an additional  PA view of the chest was obtained which was not available at the  time of the initial interpretation. The lung apices are better  visualized and appear clear demonstrating bullous emphysematous  disease.  Electronically Signed  By: Inge Rise M.D.  On: 07/13/2013 09:58  07/08/14- 52 yoF former smoker followed for COPD, chronic respiratory failure, OHS/BIPAP,   complicated by hx colon cancer, DM  - daughter here. PCP Emmie Niemann, NP/ [facility] BIPAP 10/5 used w/ O2 3.5L-4LAdvanced                  DTR here FOLLOW FOR: breathing not good; cough; congestion Denies acute changes. Dependent on as much help as she can get for ADLs. Using nebulizer about 4 times daily. Limited ambulation room to roo CXR 06/05/14 IMPRESSION: No active disease. Hyperinflation. Stable right basilar atelectasis or scarring. Original Report Authenticated By: Lahoma Crocker, M.D.  07/14/2015-75 year old female former smoker followed for COPD, chronic respiratory failure hypoxia, OHS/BiPAP, complicated by history colon cancer, DM BIPAP 10/5 used w/ O2 3.5L-4LAdvanced  FOLLOWS FOR: Pt contiuues to use O2 through [facility] as well as BiPAP. Pt states she needs [facility] to help her with mask and machine fit. Temazepam not helping patient any longer-would like to discuss getting Ambien rx.  She asks for home health nurse assistance and we discussed short-term versus long-term care needs. She resists the idea of leaving her home but admits she has significant trouble getting around and making her meals. I asked her to talk frankly with family and her primary physician about long-term care needs. She takes Xanax for anxiety.  Review of Systems-see HPI Constitutional:   No weight loss, night sweats,  +Fevers, chills, +fatigue, lassitude. HEENT:   No headaches,  Difficulty swallowing,  Tooth/dental problems,  Sore throat,  No sneezing, itching, ear ache, nasal congestion,+post nasal drip,  CV:  No chest pain, orthopnea, PND, swelling in lower extremities, anasarca, dizziness, palpitations GI  No heartburn, indigestion, abdominal pain, nausea, vomiting, diarrhea, change in bowel habits, loss of appetite Resp:   No-productive cough,  + non-productive cough,  No coughing up of blood.  No change in color of  mucus.  + wheezing.  Skin: no rash or lesions. GU:  MS:  Weak and deconditioned   Psych:  + change in mood or affect. + depression or anxiety.  No memory loss.   Objective:   Physical Exam General- Alert, Oriented, Affect-appropriate, Distress- none acute;                             +portable oxygen 3 L per minute, + obese, +wheelchair Skin- rash-none, lesions- none, excoriation- none Lymphadenopathy- none Head- atraumatic            Eyes- Gross vision intact, PERRLA, conjunctivae clear secretions            Ears- Hearing, canals-within normal limits            Nose- Clear, no-Septal dev, mucus, polyps, erosion, perforation             Throat- Mallampati II , mucosa clear , drainage- none, tonsils- atrophic Neck- flexible , trachea midline, no stridor , thyroid nl, carotid no bruit Chest - symmetrical excursion ,            Heart/CV- RRR , no murmur , no gallop  , no rub, nl s1 s2                           - JVD- none , edema- none, stasis changes- none, varices- none           Lung- +distant, Wheeze-none, unlabored,  + deep , dullness-none, rub- none.             Chest wall-  Abd-  Br/ Gen/ Rectal- Not done, not indicated Extrem- cyanosis- none, clubbing, none, atrophy- none, strength- nl Neuro- grossly intact to observation

## 2015-07-17 ENCOUNTER — Telehealth: Payer: Self-pay | Admitting: Internal Medicine

## 2015-07-17 NOTE — Telephone Encounter (Signed)
LMTCB

## 2015-07-18 NOTE — Telephone Encounter (Signed)
Advair is similar to the [facility] inhaler she is already using, so we won't add  Advair  Ok to d/c Brovana from her list. She can continue using her albuterol nebulizer solution.

## 2015-07-18 NOTE — Telephone Encounter (Signed)
atc pt, NA and VM full. WCB

## 2015-07-18 NOTE — Telephone Encounter (Signed)
Patient states that her Garlon Hatchet neb is too expensive and was advised that an alternative would be Advair.  Pt reports being on Advair and Spiriva in the past.  Please advise Dr Annamaria Boots. Thanks.    Medication List       This list is accurate as of: 07/17/15 11:59 PM.  Always use your most recent med list.               albuterol 108 (90 Base) MCG/ACT inhaler  Commonly known as:  PROAIR HFA  Inhale 2 puffs into the lungs every 6 (six) hours as needed for wheezing or shortness of breath.     albuterol (2.5 MG/3ML) 0.083% nebulizer solution  Commonly known as:  PROVENTIL  Use 1 vial in nebulizer every 2-4 hours for  Shortness of breath and wheezing     ALPRAZolam 1 MG tablet  Commonly known as:  XANAX  Take 1/2 to 1 tablet by mouth three times daily as needed     arformoterol 15 MCG/2ML Nebu  Commonly known as:  BROVANA  Take 2 mLs (15 mcg total) by nebulization 2 (two) times daily.     budesonide 0.25 MG/2ML nebulizer solution  Commonly known as:  PULMICORT  Take 2 mLs (0.25 mg total) by nebulization 2 (two) times daily.     CENTRUM SILVER ADULT 50+ Tabs  Take 1 tablet by mouth daily.     Doxepin HCl 3 MG Tabs  Commonly known as:  SILENOR  1 or 2 at bedtime for sleep as needed     Fluticasone Furoate-Vilanterol 200-25 MCG/INH Aepb  Commonly known as:  BREO ELLIPTA  Inhale 1 puff into the lungs daily. , Rinse mouth     glipiZIDE 2.5 MG 24 hr tablet  Commonly known as:  GLUCOTROL XL  Take 2.5 mg by mouth daily.     guaiFENesin 600 MG 12 hr tablet  Commonly known as:  MUCINEX  Take 1,200 mg by mouth daily.     ipratropium 0.02 % nebulizer solution  Commonly known as:  ATROVENT  Use in nebulizer four times daily as needed.     ipratropium 0.06 % nasal spray  Commonly known as:  ATROVENT  USE 1-2 SPRAYS IN EACH NOSTRIL 4 TIMES DAILY FOR RUNNY NOSE     KLOR-CON M20 20 MEQ tablet  Generic drug:  potassium chloride SA  Take 20 mEq by mouth daily.     nystatin 100000  UNIT/ML suspension  Commonly known as:  MYCOSTATIN  TAKE 5MLS BY MOUTH 4 TIMES A DAY     OSTEO BI-FLEX JOINT SHIELD PO  Take 2 capsules by mouth daily.     predniSONE 5 MG tablet  Commonly known as:  DELTASONE  TAKE 1-2 TABLETS BY MOUTH DAILY     sertraline 100 MG tablet  Commonly known as:  ZOLOFT  Take 200 mg by mouth daily.     SYMBICORT 160-4.5 MCG/ACT inhaler  Generic drug:  budesonide-formoterol  USE 2 PUFFS TWICE DAILY     temazepam 15 MG capsule  Commonly known as:  RESTORIL  Take 1 capsule (15 mg total) by mouth at bedtime as needed for sleep.     traMADol 50 MG tablet  Commonly known as:  ULTRAM  Take 50 mg by mouth every 12 (twelve) hours as needed.     vitamin C 500 MG tablet  Commonly known as:  ASCORBIC ACID  Take 1,000 mg by mouth daily.

## 2015-07-20 NOTE — Assessment & Plan Note (Signed)
Helpless/hopeless affect. Wants other people to take care of her so that she can live alone at home.

## 2015-07-20 NOTE — Assessment & Plan Note (Signed)
She wanted more sleep medication. I resisted request for Ambien and suggested trial of Silenor

## 2015-07-20 NOTE — Assessment & Plan Note (Signed)
Continues dependent on oxygen and BiPAP for chronic hypoxic respiratory failure Plan-replacement mask and supplies

## 2015-07-20 NOTE — Assessment & Plan Note (Signed)
She and her sister did not think she has had a change in her pulmonary status. She just remains very limited and oxygen dependent. Plan-flu vaccine

## 2015-07-21 NOTE — Telephone Encounter (Signed)
Mailbox full, cannot leave message, will call back.

## 2015-07-22 NOTE — Telephone Encounter (Signed)
atc X3, no answer, no vm.  Wcb.

## 2015-07-23 NOTE — Telephone Encounter (Signed)
Spoke with pt and she is fine with staying on Symbicort.  Advised pt to stay on all current meds that we discussed except she can stop Brovana due to cost.  Pt verbalized understanding.  Nothing further is needed.

## 2015-07-23 NOTE — Telephone Encounter (Signed)
[phone], pt cb

## 2015-07-23 NOTE — Telephone Encounter (Signed)
What is the issue now? She is using Symbicort. If it is too expensive, and Advair is covered, then she can be changed to Advair 250, # 1, inhale 1 puff, then rinse mouth, twice daily, refill x 12.

## 2015-07-23 NOTE — Telephone Encounter (Signed)
ATC, NA and mailbox full

## 2015-07-23 NOTE — Telephone Encounter (Signed)
Spoke with pt .  She reports that she never started using Breo.  I clarified her meds with her and she states that she is taking Symbicort 160 2 puff bid, Proair prn, Budesonide HHN bid, Albuterol and Ipatropium HHN prn.  Please advise.

## 2015-09-11 ENCOUNTER — Telehealth: Payer: Self-pay | Admitting: Internal Medicine

## 2015-09-11 MED ORDER — ALBUTEROL SULFATE HFA 108 (90 BASE) MCG/ACT IN AERS: 2.0000 | INHALATION_SPRAY | Freq: Four times a day (QID) | RESPIRATORY_TRACT | Status: AC | PRN

## 2015-09-11 NOTE — Telephone Encounter (Signed)
Spoke with pt. She is aware that we will have change her rescue inhaler. She agreed and verbalized understanding. Rx has been sent in. Nothing further was needed.

## 2015-12-01 ENCOUNTER — Other Ambulatory Visit: Payer: Self-pay | Admitting: Internal Medicine

## 2016-01-20 ENCOUNTER — Other Ambulatory Visit: Payer: Self-pay | Admitting: Internal Medicine

## 2016-02-03 ENCOUNTER — Emergency Department (HOSPITAL_COMMUNITY): Payer: Medicare HMO

## 2016-02-03 ENCOUNTER — Inpatient Hospital Stay (HOSPITAL_COMMUNITY)
Admission: EM | Admit: 2016-02-03 | Discharge: 2016-02-09 | DRG: 190 | Disposition: A | Discharge status: Home Health | Payer: Medicare HMO | Attending: Internal Medicine | Admitting: Internal Medicine

## 2016-02-03 ENCOUNTER — Encounter (HOSPITAL_COMMUNITY): Payer: Self-pay

## 2016-02-03 DIAGNOSIS — Z66 Do not resuscitate: Secondary | ICD-10-CM | POA: Diagnosis present

## 2016-02-03 DIAGNOSIS — IMO0002 Reserved for concepts with insufficient information to code with codable children: Secondary | ICD-10-CM | POA: Diagnosis present

## 2016-02-03 DIAGNOSIS — J189 Pneumonia, unspecified organism: Secondary | ICD-10-CM | POA: Diagnosis present

## 2016-02-03 DIAGNOSIS — Z885 Allergy status to narcotic agent status: Secondary | ICD-10-CM

## 2016-02-03 DIAGNOSIS — Z85038 Personal history of other malignant neoplasm of large intestine: Secondary | ICD-10-CM | POA: Diagnosis not present

## 2016-02-03 DIAGNOSIS — Z6835 Body mass index (BMI) 35.0-35.9, adult: Secondary | ICD-10-CM

## 2016-02-03 DIAGNOSIS — Z87891 Personal history of nicotine dependence: Secondary | ICD-10-CM | POA: Diagnosis not present

## 2016-02-03 DIAGNOSIS — J9621 Acute and chronic respiratory failure with hypoxia: Secondary | ICD-10-CM | POA: Diagnosis present

## 2016-02-03 DIAGNOSIS — Z5320 Procedure and treatment not carried out because of patient's decision for unspecified reasons: Secondary | ICD-10-CM | POA: Diagnosis not present

## 2016-02-03 DIAGNOSIS — Z825 Family history of asthma and other chronic lower respiratory diseases: Secondary | ICD-10-CM | POA: Diagnosis not present

## 2016-02-03 DIAGNOSIS — E872 Acidosis: Secondary | ICD-10-CM | POA: Diagnosis present

## 2016-02-03 DIAGNOSIS — R0600 Dyspnea, unspecified: Secondary | ICD-10-CM

## 2016-02-03 DIAGNOSIS — J209 Acute bronchitis, unspecified: Secondary | ICD-10-CM | POA: Diagnosis not present

## 2016-02-03 DIAGNOSIS — F32A Depression, unspecified: Secondary | ICD-10-CM | POA: Diagnosis present

## 2016-02-03 DIAGNOSIS — J9602 Acute respiratory failure with hypercapnia: Secondary | ICD-10-CM

## 2016-02-03 DIAGNOSIS — Z9981 Dependence on supplemental oxygen: Secondary | ICD-10-CM

## 2016-02-03 DIAGNOSIS — J9601 Acute respiratory failure with hypoxia: Secondary | ICD-10-CM

## 2016-02-03 DIAGNOSIS — E662 Morbid (severe) obesity with alveolar hypoventilation: Secondary | ICD-10-CM | POA: Diagnosis not present

## 2016-02-03 DIAGNOSIS — F419 Anxiety disorder, unspecified: Secondary | ICD-10-CM | POA: Diagnosis present

## 2016-02-03 DIAGNOSIS — F329 Major depressive disorder, single episode, unspecified: Secondary | ICD-10-CM | POA: Diagnosis present

## 2016-02-03 DIAGNOSIS — Z7984 Long term (current) use of oral hypoglycemic drugs: Secondary | ICD-10-CM | POA: Diagnosis not present

## 2016-02-03 DIAGNOSIS — J9622 Acute and chronic respiratory failure with hypercapnia: Secondary | ICD-10-CM | POA: Diagnosis not present

## 2016-02-03 DIAGNOSIS — J441 Chronic obstructive pulmonary disease with (acute) exacerbation: Secondary | ICD-10-CM | POA: Diagnosis not present

## 2016-02-03 DIAGNOSIS — G4733 Obstructive sleep apnea (adult) (pediatric): Secondary | ICD-10-CM | POA: Diagnosis present

## 2016-02-03 DIAGNOSIS — Z79899 Other long term (current) drug therapy: Secondary | ICD-10-CM | POA: Diagnosis not present

## 2016-02-03 DIAGNOSIS — G4700 Insomnia, unspecified: Secondary | ICD-10-CM | POA: Diagnosis present

## 2016-02-03 DIAGNOSIS — R0902 Hypoxemia: Secondary | ICD-10-CM | POA: Diagnosis not present

## 2016-02-03 DIAGNOSIS — J440 Chronic obstructive pulmonary disease with acute lower respiratory infection: Principal | ICD-10-CM | POA: Diagnosis present

## 2016-02-03 DIAGNOSIS — E1165 Type 2 diabetes mellitus with hyperglycemia: Secondary | ICD-10-CM | POA: Diagnosis present

## 2016-02-03 DIAGNOSIS — Z7951 Long term (current) use of inhaled steroids: Secondary | ICD-10-CM | POA: Diagnosis not present

## 2016-02-03 DIAGNOSIS — J9612 Chronic respiratory failure with hypercapnia: Secondary | ICD-10-CM | POA: Diagnosis present

## 2016-02-03 DIAGNOSIS — E118 Type 2 diabetes mellitus with unspecified complications: Secondary | ICD-10-CM | POA: Diagnosis not present

## 2016-02-03 DIAGNOSIS — R6510 Systemic inflammatory response syndrome (SIRS) of non-infectious origin without acute organ dysfunction: Secondary | ICD-10-CM | POA: Diagnosis present

## 2016-02-03 DIAGNOSIS — D638 Anemia in other chronic diseases classified elsewhere: Secondary | ICD-10-CM | POA: Diagnosis present

## 2016-02-03 DIAGNOSIS — Z7952 Long term (current) use of systemic steroids: Secondary | ICD-10-CM

## 2016-02-03 HISTORY — DX: Chronic obstructive pulmonary disease, unspecified: J44.9

## 2016-02-03 HISTORY — DX: Chronic respiratory failure with hypercapnia: J96.12

## 2016-02-03 HISTORY — DX: Type 2 diabetes mellitus without complications: E11.9

## 2016-02-03 HISTORY — DX: Chronic respiratory failure with hypoxia: J96.11

## 2016-02-03 LAB — BASIC METABOLIC PANEL
ANION GAP: 8 (ref 5–15)
BUN: 18 mg/dL (ref 6–20)
CALCIUM: 9.2 mg/dL (ref 8.9–10.3)
CO2: 38 mmol/L — AB (ref 22–32)
CREATININE: 1.1 mg/dL — AB (ref 0.44–1.00)
Chloride: 90 mmol/L — ABNORMAL LOW (ref 101–111)
GFR calc non Af Amer: 49 mL/min — ABNORMAL LOW (ref >60)
GFR, EST AFRICAN AMERICAN: 56 mL/min — AB (ref >60)
GLUCOSE: 239 mg/dL — AB (ref 65–99)
Potassium: 4.9 mmol/L (ref 3.5–5.1)
SODIUM: 136 mmol/L (ref 135–145)

## 2016-02-03 LAB — LACTIC ACID, PLASMA
LACTIC ACID, VENOUS: 6.6 mmol/L — AB (ref 0.5–1.9)
LACTIC ACID, VENOUS: 5.3 mmol/L — AB (ref 0.5–1.9)

## 2016-02-03 LAB — CBC WITH DIFFERENTIAL/PLATELET
BASOS ABS: 0 10*3/uL (ref 0.0–0.1)
BASOS PCT: 0 %
EOS ABS: 0 10*3/uL (ref 0.0–0.7)
Eosinophils Relative: 0 %
HCT: 36.2 % (ref 36.0–46.0)
HEMOGLOBIN: 10.8 g/dL — AB (ref 12.0–15.0)
LYMPHS PCT: 8 %
Lymphs Abs: 0.6 10*3/uL — ABNORMAL LOW (ref 0.7–4.0)
MCH: 29 pg (ref 26.0–34.0)
MCHC: 29.8 g/dL — ABNORMAL LOW (ref 30.0–36.0)
MCV: 97.1 fL (ref 78.0–100.0)
MONO ABS: 0.1 10*3/uL (ref 0.1–1.0)
MONOS PCT: 2 %
NEUTROS ABS: 6.2 10*3/uL (ref 1.7–7.7)
Neutrophils Relative %: 90 %
Platelets: 205 10*3/uL (ref 150–400)
RBC: 3.73 MIL/uL — AB (ref 3.87–5.11)
RDW: 13.6 % (ref 11.5–15.5)
WBC: 6.9 10*3/uL (ref 4.0–10.5)

## 2016-02-03 LAB — I-STAT ARTERIAL BLOOD GAS, ED
ACID-BASE EXCESS: 8 mmol/L — AB (ref 0.0–2.0)
Acid-Base Excess: 14 mmol/L — ABNORMAL HIGH (ref 0.0–2.0)
BICARBONATE: 34.9 meq/L — AB (ref 20.0–24.0)
Bicarbonate: 41.5 mEq/L — ABNORMAL HIGH (ref 20.0–24.0)
O2 SAT: 96 %
O2 Saturation: 90 %
PCO2 ART: 65.5 mmHg — AB (ref 35.0–45.0)
PO2 ART: 61 mmHg — AB (ref 80.0–100.0)
PO2 ART: 87 mmHg (ref 80.0–100.0)
Patient temperature: 98.6
TCO2: 43 mmol/L (ref 0–100)
TCO2: 37 mmol/L (ref 0–100)
pCO2 arterial: 58.3 mmHg (ref 35.0–45.0)
pH, Arterial: 7.41 (ref 7.350–7.450)
pH, Arterial: 7.385 (ref 7.350–7.450)

## 2016-02-03 LAB — I-STAT CG4 LACTIC ACID, ED: Lactic Acid, Venous: 2.38 mmol/L (ref 0.5–1.9)

## 2016-02-03 LAB — TROPONIN I: Troponin I: 0.03 ng/mL (ref <0.03)

## 2016-02-03 LAB — URINALYSIS, ROUTINE W REFLEX MICROSCOPIC
Bilirubin Urine: NEGATIVE
GLUCOSE, UA: 100 mg/dL — AB
Hgb urine dipstick: NEGATIVE
Ketones, ur: NEGATIVE mg/dL
LEUKOCYTES UA: NEGATIVE
Nitrite: NEGATIVE
PROTEIN: NEGATIVE mg/dL
SPECIFIC GRAVITY, URINE: 1.019 (ref 1.005–1.030)
pH: 6.5 (ref 5.0–8.0)

## 2016-02-03 LAB — PROCALCITONIN: Procalcitonin: <0.1 ng/mL

## 2016-02-03 LAB — CK: Total CK: 337 U/L — ABNORMAL HIGH (ref 38–234)

## 2016-02-03 LAB — MRSA PCR SCREENING: MRSA by PCR: NEGATIVE

## 2016-02-03 LAB — BRAIN NATRIURETIC PEPTIDE: B NATRIURETIC PEPTIDE 5: 140 pg/mL — AB (ref 0.0–100.0)

## 2016-02-03 LAB — STREP PNEUMONIAE URINARY ANTIGEN: STREP PNEUMO URINARY ANTIGEN: NEGATIVE

## 2016-02-03 MED ORDER — BUDESONIDE 0.25 MG/2ML IN SUSP: 0.2500 mg | Freq: Two times a day (BID) | RESPIRATORY_TRACT | Status: DC

## 2016-02-03 MED ORDER — VITAMIN C 500 MG PO TABS: 1000.0000 mg | ORAL_TABLET | Freq: Every day | ORAL | Status: DC

## 2016-02-03 MED ORDER — ENOXAPARIN SODIUM 40 MG/0.4ML ~~LOC~~ SOLN
40.0000 mg | SUBCUTANEOUS | Status: DC
  Administered 2016-02-03 – 2016-02-08 (×6): 40 mg via SUBCUTANEOUS
  Filled 2016-02-03 (×6): qty 0.4

## 2016-02-03 MED ORDER — ASPIRIN 81 MG PO CHEW
324.0000 mg | CHEWABLE_TABLET | Freq: Once | ORAL | Status: AC
  Administered 2016-02-03: 324 mg via ORAL
  Filled 2016-02-03: qty 4

## 2016-02-03 MED ORDER — SERTRALINE HCL 50 MG PO TABS: 200.0000 mg | ORAL_TABLET | Freq: Every day | ORAL | Status: DC

## 2016-02-03 MED ORDER — DEXTROSE 5 % IV SOLN
1.0000 g | Freq: Once | INTRAVENOUS | Status: AC
  Administered 2016-02-03: 1 g via INTRAVENOUS
  Filled 2016-02-03: qty 10

## 2016-02-03 MED ORDER — METHYLPREDNISOLONE SODIUM SUCC 125 MG IJ SOLR
60.0000 mg | Freq: Four times a day (QID) | INTRAMUSCULAR | Status: DC
  Administered 2016-02-03 – 2016-02-05 (×7): 60 mg via INTRAVENOUS
  Filled 2016-02-03 (×6): qty 2

## 2016-02-03 MED ORDER — CENTRUM SILVER ADULT 50+ PO TABS: 1.0000 | ORAL_TABLET | Freq: Every day | ORAL | Status: DC

## 2016-02-03 MED ORDER — ALBUTEROL (5 MG/ML) CONTINUOUS INHALATION SOLN
10.0000 mg/h | INHALATION_SOLUTION | Freq: Once | RESPIRATORY_TRACT | Status: AC
  Administered 2016-02-03: 10 mg/h via RESPIRATORY_TRACT
  Filled 2016-02-03: qty 20

## 2016-02-03 MED ORDER — ACETAMINOPHEN 650 MG RE SUPP: 650.0000 mg | Freq: Four times a day (QID) | RECTAL | Status: DC | PRN

## 2016-02-03 MED ORDER — METHYLPREDNISOLONE SODIUM SUCC 125 MG IJ SOLR
80.0000 mg | Freq: Once | INTRAMUSCULAR | Status: AC
Start: 2016-02-03 — End: 2016-02-03
  Administered 2016-02-03: 80 mg via INTRAVENOUS
  Filled 2016-02-03: qty 2

## 2016-02-03 MED ORDER — DEXTROSE 5 % IV SOLN
1.0000 g | INTRAVENOUS | Status: DC
  Filled 2016-02-03: qty 10

## 2016-02-03 MED ORDER — DEXTROSE 5 % IV SOLN
1.0000 g | INTRAVENOUS | Status: AC
  Administered 2016-02-04 – 2016-02-07 (×4): 1 g via INTRAVENOUS
  Filled 2016-02-03 (×4): qty 10

## 2016-02-03 MED ORDER — DEXTROSE 5 % IV SOLN
500.0000 mg | INTRAVENOUS | Status: DC
  Administered 2016-02-04 – 2016-02-06 (×3): 500 mg via INTRAVENOUS
  Filled 2016-02-03 (×4): qty 500

## 2016-02-03 MED ORDER — DEXTROSE 5 % IV SOLN
500.0000 mg | INTRAVENOUS | Status: DC
  Filled 2016-02-03: qty 500

## 2016-02-03 MED ORDER — LORAZEPAM 2 MG/ML IJ SOLN
0.5000 mg | Freq: Once | INTRAMUSCULAR | Status: AC
  Administered 2016-02-03: 0.5 mg via INTRAVENOUS
  Filled 2016-02-03: qty 1

## 2016-02-03 MED ORDER — ALBUTEROL SULFATE HFA 108 (90 BASE) MCG/ACT IN AERS: 2.0000 | INHALATION_SPRAY | Freq: Four times a day (QID) | RESPIRATORY_TRACT | Status: DC | PRN

## 2016-02-03 MED ORDER — LEVALBUTEROL HCL 0.63 MG/3ML IN NEBU
0.6300 mg | INHALATION_SOLUTION | Freq: Four times a day (QID) | RESPIRATORY_TRACT | Status: DC
  Administered 2016-02-03 – 2016-02-05 (×7): 0.63 mg via RESPIRATORY_TRACT
  Filled 2016-02-03 (×8): qty 3

## 2016-02-03 MED ORDER — SODIUM CHLORIDE 0.9 % IV SOLN
INTRAVENOUS | Status: DC
  Administered 2016-02-03 – 2016-02-04 (×2): via INTRAVENOUS

## 2016-02-03 MED ORDER — IPRATROPIUM-ALBUTEROL 0.5-2.5 (3) MG/3ML IN SOLN
3.0000 mL | RESPIRATORY_TRACT | Status: DC | PRN
  Administered 2016-02-09: 3 mL via RESPIRATORY_TRACT
  Filled 2016-02-03: qty 3

## 2016-02-03 MED ORDER — IPRATROPIUM-ALBUTEROL 0.5-2.5 (3) MG/3ML IN SOLN: 3.0000 mL | Freq: Once | RESPIRATORY_TRACT | Status: DC

## 2016-02-03 MED ORDER — DEXTROSE 5 % IV SOLN
500.0000 mg | Freq: Once | INTRAVENOUS | Status: AC
  Administered 2016-02-03: 500 mg via INTRAVENOUS
  Filled 2016-02-03: qty 500

## 2016-02-03 MED ORDER — MUSCLE RUB 10-15 % EX CREA
TOPICAL_CREAM | CUTANEOUS | Status: DC | PRN
  Administered 2016-02-03: 1 via TOPICAL
  Administered 2016-02-05: 21:00:00 via TOPICAL
  Filled 2016-02-03: qty 85

## 2016-02-03 MED ORDER — LORAZEPAM 2 MG/ML IJ SOLN
0.5000 mg | Freq: Four times a day (QID) | INTRAMUSCULAR | Status: AC | PRN
  Administered 2016-02-03 – 2016-02-04 (×2): 0.5 mg via INTRAVENOUS
  Filled 2016-02-03 (×2): qty 1

## 2016-02-03 MED ORDER — BUDESONIDE 0.25 MG/2ML IN SUSP
0.2500 mg | Freq: Two times a day (BID) | RESPIRATORY_TRACT | Status: DC
  Administered 2016-02-03 – 2016-02-05 (×4): 0.25 mg via RESPIRATORY_TRACT
  Filled 2016-02-03 (×6): qty 2

## 2016-02-03 MED ORDER — SODIUM CHLORIDE 0.9 % IV BOLUS (SEPSIS)
1000.0000 mL | Freq: Once | INTRAVENOUS | Status: AC
  Administered 2016-02-03: 1000 mL via INTRAVENOUS

## 2016-02-03 MED ORDER — ALBUTEROL SULFATE (2.5 MG/3ML) 0.083% IN NEBU
5.0000 mg | INHALATION_SOLUTION | Freq: Once | RESPIRATORY_TRACT | Status: AC
  Administered 2016-02-03: 5 mg via RESPIRATORY_TRACT
  Filled 2016-02-03: qty 6

## 2016-02-03 MED ORDER — FLUTICASONE FUROATE-VILANTEROL 200-25 MCG/INH IN AEPB: 1.0000 | INHALATION_SPRAY | Freq: Every day | RESPIRATORY_TRACT | Status: DC

## 2016-02-03 NOTE — ED Notes (Signed)
Attempted to call report

## 2016-02-03 NOTE — ED Notes (Signed)
Pts daughter stated that the doctors office called the house today and stated that the pt needed to go to the hospital based off of the blood work and chest x-ray that was taken yesterday. The daughter states that the office did not say what the results were.

## 2016-02-03 NOTE — ED Provider Notes (Signed)
[facility] DEPT Provider Note   CSN: [id number] Arrival date & time: 02/03/16  1204     History   Chief Complaint Chief Complaint  Patient presents with  . Shortness of Breath    HPI Meredith [name] is a 75 y.o. female.  Patient was COPD, chronic respiratory failure, intubation history presents with worsening shortness of breath for past few days. EMS came to patient's house and she refused transport recently. Patient was given to be treatment in the office yesterday. Patient's had worsening shortness of breath and intermittent confusion recently. No fevers or chills however productive cough with green sputum. No recent surgery, no active cancer treatment, no leg swelling, no blood clot history. Symptoms constant. Improved on arrival with treatment.    Shortness of Breath  Associated symptoms include cough. Pertinent negatives include no fever, no headaches, no neck pain, no chest pain, no vomiting, no abdominal pain and no rash.    Past Medical History:  Diagnosis Date  . Acute-on-chronic respiratory failure (HCC)    vent/permissive hypercapnea  . Anxiety   . Colon cancer ([place])    colon cancer  . COPD (chronic obstructive pulmonary disease) ([place])   . Insomnia     Patient Active Problem List   Diagnosis Date Noted  . Acute exacerbation of chronic obstructive pulmonary disease (COPD) ([place]) 02/03/2016  . COPD with acute exacerbation ([place]) 08/07/2013  . Depression 10/05/2011  . 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 Clinical depression 05/30/2011  . NECK MASS 04/30/2010  . THRUSH 12/30/2009  . INSOMNIA 05/14/2009  . Chronic respiratory failure with hypoxia ([place]) 05/08/2008  . COLON CANCER 12/29/2007  . COPD mixed type ([place]) 12/29/2007    Past Surgical History:  Procedure Laterality Date  . COLON SURGERY    . Cyst removed from Neck      OB History    No data available       Home Medications    Prior to Admission medications   Medication Sig Start Date End  Date Taking? Authorizing Provider  albuterol (PROVENTIL HFA;VENTOLIN HFA) 108 (90 Base) MCG/ACT inhaler Inhale 2 puffs into the lungs every 6 (six) hours as needed for wheezing or shortness of breath. 09/11/15   Deneise Lever, MD  albuterol (PROVENTIL) (2.5 MG/3ML) 0.083% nebulizer solution Use 1 vial in nebulizer every 2-4 hours for  Shortness of breath and wheezing 04/14/15   Deneise Lever, MD  ALPRAZolam Duanne Moron) 1 MG tablet Take 1/2 to 1 tablet by mouth three times daily as needed    Historical Provider, MD  budesonide (PULMICORT) 0.25 MG/2ML nebulizer solution Take 2 mLs (0.25 mg total) by nebulization 2 (two) times daily. 09/20/13   Deneise Lever, MD  Doxepin HCl (SILENOR) 3 MG TABS 1 or 2 at bedtime for sleep as needed 07/14/15   Deneise Lever, MD  Fluticasone Furoate-Vilanterol (BREO ELLIPTA) 200-25 MCG/INH AEPB Inhale 1 puff into the lungs daily. , Rinse mouth Patient not taking: Reported on 07/14/2015 07/17/14   Deneise Lever, MD  glipiZIDE (GLUCOTROL XL) 2.5 MG 24 hr tablet Take 2.5 mg by mouth daily.      Historical Provider, MD  guaiFENesin (MUCINEX) 600 MG 12 hr tablet Take 1,200 mg by mouth daily.     Historical Provider, MD  ipratropium (ATROVENT) 0.02 % nebulizer solution Use in nebulizer four times daily as needed. 04/15/15   Deneise Lever, MD  ipratropium (ATROVENT) 0.06 % nasal spray USE 1-2 SPRAYS IN EACH NOSTRIL 4 TIMES DAILY FOR  RUNNY NOSE 07/26/14   Deneise Lever, MD  KLOR-CON M20 20 MEQ tablet Take 20 mEq by mouth daily. 05/14/14   Historical Provider, MD  Misc Natural Products (OSTEO BI-FLEX JOINT SHIELD PO) Take 2 capsules by mouth daily.    Historical Provider, MD  Multiple Vitamins-Minerals (CENTRUM SILVER ADULT 50+) TABS Take 1 tablet by mouth daily.    Historical Provider, MD  nystatin (MYCOSTATIN) 100000 UNIT/ML suspension TAKE 5MLS BY MOUTH 4 TIMES A DAY 01/15/15   Deneise Lever, MD  predniSONE (DELTASONE) 5 MG tablet TAKE 1-2 TABLETS BY MOUTH DAILY 01/20/16    Deneise Lever, MD  sertraline (ZOLOFT) 100 MG tablet Take 200 mg by mouth daily. 06/10/15   Historical Provider, MD  SYMBICORT 160-4.5 MCG/ACT inhaler USE 2 PUFFS TWICE DAILY 07/04/15   Historical Provider, MD  temazepam (RESTORIL) 15 MG capsule Take 1 capsule (15 mg total) by mouth at bedtime as needed for sleep. 09/12/12   Deneise Lever, MD  traMADol (ULTRAM) 50 MG tablet Take 50 mg by mouth every 12 (twelve) hours as needed. 06/25/14   Historical Provider, MD  vitamin C (ASCORBIC ACID) 500 MG tablet Take 1,000 mg by mouth daily.     Historical Provider, MD    Family History Family History  Problem Relation Age of Onset  . COPD Mother   . Heart attack Father     Social History Social History  Substance Use Topics  . Smoking status: Former Research scientist (life sciences)  . Smokeless tobacco: Former Systems developer    Quit date: 09/03/2008  . Alcohol use No     Allergies   Morphine   Review of Systems Review of Systems  Constitutional: Negative for chills and fever.  HENT: Negative for congestion.   Eyes: Negative for visual disturbance.  Respiratory: Positive for cough and shortness of breath.   Cardiovascular: Negative for chest pain.  Gastrointestinal: Negative for abdominal pain and vomiting.  Genitourinary: Negative for dysuria and flank pain.  Musculoskeletal: Negative for back pain, neck pain and neck stiffness.  Skin: Negative for rash.  Neurological: Negative for light-headedness and headaches.  Psychiatric/Behavioral: Positive for confusion.     Physical Exam Updated Vital Signs BP 107/86   Pulse 120   Temp 98.7 F (37.1 C) (Oral)   Resp 21   Ht 5\' 6"  (1.676 m)   Wt 211 lb (95.7 kg)   SpO2 91%   BMI 34.06 kg/m   Physical Exam  Constitutional: She is oriented to person, place, and time. She appears well-developed and well-nourished.  HENT:  Head: Normocephalic and atraumatic.  Dry mucous membranes prolonged exp phase  Eyes: Conjunctivae are normal. Right eye exhibits no discharge.  Left eye exhibits no discharge.  Neck: Normal range of motion. Neck supple. No tracheal deviation present.  Cardiovascular: Normal rate and regular rhythm.   Pulmonary/Chest: She is in respiratory distress. She has wheezes (decreased air movement bilateral).  Abdominal: Soft. She exhibits no distension. There is no tenderness. There is no guarding.  Musculoskeletal: She exhibits no edema.  Neurological: She is alert and oriented to person, place, and time.  Skin: Skin is warm. No rash noted.  Psychiatric: She has a normal mood and affect.  Nursing note and vitals reviewed.    ED Treatments / Results  Labs (all labs ordered are listed, but only abnormal results are displayed) Labs Reviewed  CBC WITH DIFFERENTIAL/PLATELET - Abnormal; Notable for the following:       Result Value   RBC 3.73 (*)  Hemoglobin 10.8 (*)    MCHC 29.8 (*)    Lymphs Abs 0.6 (*)    All other components within normal limits  BASIC METABOLIC PANEL - Abnormal; Notable for the following:    Chloride 90 (*)    CO2 38 (*)    Glucose, Bld 239 (*)    Creatinine, Ser 1.10 (*)    GFR calc non Af Amer 49 (*)    GFR calc Af Amer 56 (*)    All other components within normal limits  TROPONIN I - Abnormal; Notable for the following:    Troponin I 0.03 (*)    All other components within normal limits  I-STAT CG4 LACTIC ACID, ED - Abnormal; Notable for the following:    Lactic Acid, Venous 2.38 (*)    All other components within normal limits  I-STAT ARTERIAL BLOOD GAS, ED - Abnormal; Notable for the following:    pCO2 arterial 65.5 (*)    pO2, Arterial 61.0 (*)    Bicarbonate 41.5 (*)    Acid-Base Excess 14.0 (*)    All other components within normal limits  CULTURE, BLOOD (ROUTINE X 2)  CULTURE, BLOOD (ROUTINE X 2)  BLOOD GAS, ARTERIAL  URINALYSIS, ROUTINE W REFLEX MICROSCOPIC (NOT AT [facility])  BRAIN NATRIURETIC PEPTIDE  PROCALCITONIN  LACTIC ACID, PLASMA  LACTIC ACID, PLASMA    EKG  EKG  Interpretation  Date/Time:  Tuesday February 03 2016 12:14:59 EDT Ventricular Rate:  94 PR Interval:    QRS Duration: 97 QT Interval:  346 QTC Calculation: 433 R Axis:   25 Text Interpretation:  Sinus rhythm Mild elevation aVL poor baseline Confirmed by Reather Converse MD, Vonna Kotyk [id number]) on 02/03/2016 12:18:58 PM Also confirmed by Reather Converse MD, Katherin Ramey [phone]), editor Yehuda [phone])  on 02/03/2016 1:39:39 PM       Radiology Dg Chest Portable 1 View  Result Date: 02/03/2016 CLINICAL DATA:  Shortness of breath for a week, history COPD, colon cancer EXAM: PORTABLE CHEST 1 VIEW COMPARISON:  Portable exam 1223 hours compared to 02/02/2016 FINDINGS: Upper normal heart size. Atherosclerotic calcification aorta. Mediastinal contours and pulmonary vascularity normal. Emphysematous changes with bibasilar infiltrates which could represent infection or edema, new since previous exam. Upper lungs clear. No pleural effusion or pneumothorax. Bones demineralized. IMPRESSION: COPD changes with new bibasilar atelectasis versus infiltrate. Aortic atherosclerosis. Electronically Signed   By: Lavonia Dana M.D.   On: 02/03/2016 13:23    Procedures Procedures (including critical care time) CRITICAL CARE Performed by: Mariea Clonts   Total critical care time: 35 minutes  Critical care time was exclusive of separately billable procedures and treating other patients.  Critical care was necessary to treat or prevent imminent or life-threatening deterioration.  Critical care was time spent personally by me on the following activities: development of treatment plan with patient and/or surrogate as well as nursing, discussions with consultants, evaluation of patient's response to treatment, examination of patient, obtaining history from patient or surrogate, ordering and performing treatments and interventions, ordering and review of laboratory studies, ordering and review of radiographic studies, pulse oximetry and  re-evaluation of patient's condition.  Medications Ordered in ED Medications  cefTRIAXone (ROCEPHIN) 1 g in dextrose 5 % 50 mL IVPB (1 g Intravenous New Bag/Given 02/03/16 1438)  azithromycin (ZITHROMAX) 500 mg in dextrose 5 % 250 mL IVPB (500 mg Intravenous New Bag/Given 02/03/16 1443)  albuterol (PROVENTIL) (2.5 MG/3ML) 0.083% nebulizer solution 5 mg (5 mg Nebulization Given 02/03/16 1225)  albuterol (PROVENTIL,VENTOLIN) solution continuous neb (  10 mg/hr Nebulization Given 02/03/16 1247)  methylPREDNISolone sodium succinate (SOLU-MEDROL) 125 mg/2 mL injection 80 mg (80 mg Intravenous Given 02/03/16 1435)  aspirin chewable tablet 324 mg (324 mg Oral Given 02/03/16 1434)     Initial Impression / Assessment and Plan / ED Course  I have reviewed the triage vital signs and the nursing notes.  Pertinent labs & imaging results that were available during my care of the patient were reviewed by me and considered in my medical decision making (see chart for details).  Clinical Course   Patient was COPD history and intubation history presents with worsening shortness of breath. Clinical concern for acute COPD exacerbation with possible pneumonia. Patient requiring 3-4 L nasal cannula in the ER. If any worsening plan for BiPAP. Continuous neb ordered, steroids, blood work and EKG. CXR pending.   Patient improved in the ER. Plan for telemetry admission. Antibiotics for likely community pneumonia and copd exac.  Medicine teaching admitting. The patients results and plan were reviewed and discussed.   Any x-rays performed were independently reviewed by myself.   Differential diagnosis were considered with the presenting HPI.  Medications  cefTRIAXone (ROCEPHIN) 1 g in dextrose 5 % 50 mL IVPB (1 g Intravenous New Bag/Given 02/03/16 1438)  azithromycin (ZITHROMAX) 500 mg in dextrose 5 % 250 mL IVPB (500 mg Intravenous New Bag/Given 02/03/16 1443)  albuterol (PROVENTIL) (2.5 MG/3ML) 0.083% nebulizer  solution 5 mg (5 mg Nebulization Given 02/03/16 1225)  albuterol (PROVENTIL,VENTOLIN) solution continuous neb (10 mg/hr Nebulization Given 02/03/16 1247)  methylPREDNISolone sodium succinate (SOLU-MEDROL) 125 mg/2 mL injection 80 mg (80 mg Intravenous Given 02/03/16 1435)  aspirin chewable tablet 324 mg (324 mg Oral Given 02/03/16 1434)    Vitals:   02/03/16 1251 02/03/16 1330 02/03/16 1345 02/03/16 1400  BP:  (!) 106/52 96/82 107/86  Pulse:  120 119 120  Resp:  16 18 21   Temp:      TempSrc:      SpO2: (!) 82% 98% 93% 91%  Weight:      Height:        Final diagnoses:  Acute exacerbation of chronic obstructive pulmonary disease (COPD) (HCC)  Acute dyspnea    Admission/ observation were discussed with the admitting physician, patient and/or family and they are comfortable with the plan.    Final Clinical Impressions(s) / ED Diagnoses   Final diagnoses:  Acute exacerbation of chronic obstructive pulmonary disease (COPD) ([place])  Acute dyspnea    New Prescriptions New Prescriptions   No medications on file     Elnora Morrison, MD 02/03/16 1458

## 2016-02-03 NOTE — Progress Notes (Signed)
Pt arrived to 2C01 from [facility].  ED RN and RT accompanied the patient during transport.  Vital signs are stable. Pt is on BiPAP and tolerating well. She is alert and oriented and has no complaints of pain.  MD notified.  Will continue to monitor.

## 2016-02-03 NOTE — ED Notes (Signed)
Dr. Marily Memos aware of pts lactic acid result.

## 2016-02-03 NOTE — H&P (Signed)
History and Physical    Meredith [name] [id number] DOB: Sep 22, 1940 DOA: 02/03/2016   PCP: Romana Juniper, MD   Patient coming from/Resides with: Private residence/lives with daughter and her husband  Chief Complaint: Shortness of breath and cough  HPI: Meredith [name] is a 75 y.o. 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 The patient reported that last week when her home health nurse was checking up on her patient had several episodes of diminished lung sounds with audible wheezing and some coughing but denied fevers or chills. She did have one day that she felt hot. She poured that the cough was productive with yellow sputum and at other times was dry. Her CBGs have been up and down. Yesterday she went to see a physician in [place] who gave her prednisone injection as well as started on a prednisone taper and an unknown antibiotic. This morning her family found her confused and restless in the bed with BiPAP on an O2 saturation 77%. EMS was called to the home and she was initially placed on 100% nonrebreather mask with 96% sats on improvement in mentation but given her history of CO2 retention O2 was decreased to 4 L/m. She was subsequently transported to [facility] ER.  ED Course:  Vital signs: 98.7-133/67-120-20-4 L oxygen with 82% saturation Portable chest x-ray: COPD changes with new bibasilar atelectasis versus infiltrates Lab data: Sodium 136, potassium 4.9, chloride 90, CO2 38, BUN 18, creatinine 1.10, glucose 239, troponin 0.03, lactic acid 2.38, WBC 6900 with neutrophils 90% absolute neutrophils 6.2%, hemoglobin 10.8, platelets 205,000, blood cultures obtained in the ER ABG: pH 7.41-pCO2 65.5-pO2 61-bicarb 41.5-ABE  14 Medications and treatments: Proventil neb 5 mg 1, continuous Proventil neb 10 mg per hour 1, Solu-Medrol 80 mg IV 1, chewable aspirin 324 mg by mouth 1, Rocephin 1 g IV 1, Zithromax 500 mg IV 1  Review of Systems:  In addition to the HPI above,  No Fever-chills, myalgias or other constitutional symptoms No Headache, changes with Vision or hearing, new weakness, tingling, numbness in any extremity, No problems swallowing food or Liquids, indigestion/reflux No Chest pain, palpitations No Abdominal pain, N/V; no melena or hematochezia, no dark tarry stools No dysuria, hematuria or flank pain No new skin rashes, lesions, masses or bruises, No new joints pains-aches No recent weight gain or loss No polyuria, polydypsia or polyphagia,   Past Medical History:  Diagnosis Date  . Acute-on-chronic respiratory failure (HCC)    vent/permissive hypercapnea  . Anxiety   . Colon cancer ([place])    colon cancer  . COPD (chronic obstructive pulmonary disease) ([place])   . Insomnia     Past Surgical History:  Procedure Laterality Date  . COLON SURGERY    . Cyst removed from Neck      Social History   Social History  . Marital status: Widowed    Spouse name: N/A  . Number of children: N/A  . Years of education: N/A   Occupational History  . retired    Social History Main Topics  . Smoking status: Former Research scientist (life sciences)  . Smokeless tobacco: Former Systems developer    Quit date: 09/03/2008  . Alcohol use No  . Drug use: Unknown  . Sexual activity: Not on file   Other Topics Concern  . Not on file  Social History Narrative  . No narrative on file    Mobility: Rolling walker Work history: Retired   Allergies  Allergen Reactions  . Morphine     Made high strung and have hallucination   . Tramadol Other (See Comments)    Makes her mean  . Zoloft [Sertraline Hcl] Other (See Comments)    Made her cry a lot    Family History  Problem Relation Age of Onset  . COPD Mother   . Heart  attack Father      Prior to Admission medications   Medication Sig Start Date End Date Taking? Authorizing Provider  albuterol (PROVENTIL HFA;VENTOLIN HFA) 108 (90 Base) MCG/ACT inhaler Inhale 2 puffs into the lungs every 6 (six) hours as needed for wheezing or shortness of breath. 09/11/15  Yes Deneise Lever, MD  albuterol (PROVENTIL) (2.5 MG/3ML) 0.083% nebulizer solution Use 1 vial in nebulizer every 2-4 hours for  Shortness of breath and wheezing 04/14/15  Yes Deneise Lever, MD  ALPRAZolam Duanne Moron) 1 MG tablet Take 1 mg by mouth 3 (three) times daily as needed for anxiety or sleep. Take 1/2 to 1 tablet by mouth three times daily as needed   Yes Historical Provider, MD  budesonide (PULMICORT) 0.25 MG/2ML nebulizer solution Take 2 mLs (0.25 mg total) by nebulization 2 (two) times daily. 09/20/13  Yes Deneise Lever, MD  glipiZIDE (GLUCOTROL XL) 2.5 MG 24 hr tablet Take 2.5 mg by mouth daily.     Yes Historical Provider, MD  guaiFENesin (MUCINEX) 600 MG 12 hr tablet Take 1,200 mg by mouth daily.    Yes Historical Provider, MD  ipratropium (ATROVENT) 0.02 % nebulizer solution Use in nebulizer four times daily as needed. Patient taking differently: Take by nebulization. Use in nebulizer four times daily as needed. 04/15/15  Yes Deneise Lever, MD  ipratropium (ATROVENT) 0.06 % nasal spray USE 1-2 SPRAYS IN EACH NOSTRIL 4 TIMES DAILY FOR RUNNY NOSE Patient taking differently: USE 1-2 SPRAYS IN EACH NOSTRIL twice daily as needed FOR RUNNY NOSE 07/26/14  Yes Deneise Lever, MD  KLOR-CON M20 20 MEQ tablet Take 20 mEq by mouth daily. 05/14/14  Yes Historical Provider, MD  levofloxacin (LEVAQUIN) 750 MG tablet Take 750 mg by mouth daily. 02/02/16  Yes Historical Provider, MD  Multiple Vitamins-Minerals (CENTRUM SILVER ADULT 50+) TABS Take 1 tablet by mouth daily.   Yes Historical Provider, MD  naproxen sodium (ANAPROX) 220 MG tablet Take 220 mg by mouth 2 (two) times daily as needed (for pain).   Yes  Historical Provider, MD  nystatin (MYCOSTATIN) 100000 UNIT/ML suspension TAKE 5MLS BY MOUTH 4 TIMES A DAY Patient taking differently: Use as directed 5 mLs in the mouth or throat 2 (two) times daily as needed (for thrush).  01/15/15  Yes Deneise Lever, MD  predniSONE (DELTASONE) 20 MG tablet Take 20 mg by mouth See admin instructions. 20 mg (1 tablet) three (3) times a day on 02-02-16, 02-03-16, and 02-04-16, then 20 mg (1 tablet) two (2) times daily for 3 days, then 20 mg (1 tablet) one (1) time daily for three (3) days 02/02/16  Yes Historical Provider, MD  SYMBICORT 160-4.5 MCG/ACT inhaler USE 2 PUFFS TWICE DAILY 07/04/15  Yes Historical Provider, MD  triamcinolone ointment (KENALOG) 0.1 % Apply 1 application topically daily as needed (for itching).  12/01/15  Yes Historical Provider, MD  vitamin C (ASCORBIC ACID) 500 MG tablet Take 1,000 mg by mouth daily.  Yes Historical Provider, MD    Physical Exam: Vitals:   02/03/16 1511 02/03/16 1513 02/03/16 1530 02/03/16 1535  BP: 112/62 112/62 (!) 100/53 (!) 100/53  Pulse: 117 119  85  Resp: 18 18 21 19   Temp:      TempSrc:      SpO2: 90% 95%  91%  Weight:      Height:          Constitutional: In mild respiratory distress as evidenced by ongoing tachypnea and poor air movement, restless as a result Eyes: PERRL, lids and conjunctivae normal ENMT: Mucous membranes are dry. Posterior pharynx clear of any exudate or lesions. Neck: normal, supple, no masses, no thyromegaly Respiratory: Very diminished lung sounds throughout without audible wheezing, somewhat labored respiratory effort with accessory muscle use. Tachypnea Cardiovascular: Regular tachycardic rate and rhythm, no murmurs / rubs / gallops. No extremity edema. 2+ pedal pulses. No carotid bruits.  Abdomen: no tenderness, no masses palpated. No hepatosplenomegaly. Bowel sounds positive.  Musculoskeletal: no clubbing / cyanosis. No joint deformity upper and lower extremities. Good ROM,  no contractures. Normal muscle tone.  Skin: no rashes, lesions, ulcers. No induration Neurologic: CN 2-12 grossly intact. Sensation intact, DTR normal. Strength 5/5 x all 4 extremities. Tremulous and upper extremities after multiple nebulizers Psychiatric: Normal judgment and insight. Alert and oriented x 3. Anxious mood.    Labs on Admission: I have personally reviewed following labs and imaging studies  CBC:  Recent Labs Lab 02/03/16 1252  WBC 6.9  NEUTROABS 6.2  HGB 10.8*  HCT 36.2  MCV 97.1  PLT 99991111   Basic Metabolic Panel:  Recent Labs Lab 02/03/16 1252  NA 136  K 4.9  CL 90*  CO2 38*  GLUCOSE 239*  BUN 18  CREATININE 1.10*  CALCIUM 9.2   GFR: Estimated Creatinine Clearance: 53.1 mL/min (by C-G formula based on SCr of 1.1 mg/dL). Liver Function Tests: No results for input(s): AST, ALT, ALKPHOS, BILITOT, PROT, ALBUMIN in the last 168 hours. No results for input(s): LIPASE, AMYLASE in the last 168 hours. No results for input(s): AMMONIA in the last 168 hours. Coagulation Profile: No results for input(s): INR, PROTIME in the last 168 hours. Cardiac Enzymes:  Recent Labs Lab 02/03/16 1252  TROPONINI 0.03*   BNP (last 3 results) No results for input(s): PROBNP in the last 8760 hours. HbA1C: No results for input(s): HGBA1C in the last 72 hours. CBG: No results for input(s): GLUCAP in the last 168 hours. Lipid Profile: No results for input(s): CHOL, HDL, LDLCALC, TRIG, CHOLHDL, LDLDIRECT in the last 72 hours. Thyroid Function Tests: No results for input(s): TSH, T4TOTAL, FREET4, T3FREE, THYROIDAB in the last 72 hours. Anemia Panel: No results for input(s): VITAMINB12, FOLATE, FERRITIN, TIBC, IRON, RETICCTPCT in the last 72 hours. Urine analysis:    Component Value Date/Time   COLORURINE YELLOW 02/07/2011 1953   APPEARANCEUR CLEAR 02/07/2011 1953   LABSPEC 1.012 02/07/2011 1953   PHURINE 6.0 02/07/2011 1953   GLUCOSEU NEGATIVE 02/07/2011 1953   HGBUR  NEGATIVE 02/07/2011 1953   BILIRUBINUR NEGATIVE 02/07/2011 1953   KETONESUR NEGATIVE 02/07/2011 1953   PROTEINUR NEGATIVE 02/07/2011 1953   UROBILINOGEN 0.2 02/07/2011 1953   NITRITE NEGATIVE 02/07/2011 1953   LEUKOCYTESUR TRACE (A) 02/07/2011 1953   Sepsis Labs: @LABRCNTIP (procalcitonin:4,lacticidven:4) )No results found for this or any previous visit (from the past 240 hour(s)).   Radiological Exams on Admission: Dg Chest Portable 1 View  Result Date: 02/03/2016 CLINICAL DATA:  Shortness of breath  for a week, history COPD, colon cancer EXAM: PORTABLE CHEST 1 VIEW COMPARISON:  Portable exam 1223 hours compared to 02/02/2016 FINDINGS: Upper normal heart size. Atherosclerotic calcification aorta. Mediastinal contours and pulmonary vascularity normal. Emphysematous changes with bibasilar infiltrates which could represent infection or edema, new since previous exam. Upper lungs clear. No pleural effusion or pneumothorax. Bones demineralized. IMPRESSION: COPD changes with new bibasilar atelectasis versus infiltrate. Aortic atherosclerosis. Electronically Signed   By: Lavonia Dana M.D.   On: 02/03/2016 13:23    EKG: (Independently reviewed) sinus rhythm with ventricular rate 94 bpm, QTC 433 ms, no acute ischemic changes  Assessment/Plan Principal Problem:    Acute on chronic respiratory failure with hypoxia and hypercapnia/Acute exacerbation of chronic obstructive pulmonary disease (COPD) with CAP (community acquired pneumonia) vs acute bronchitis -Patient presents with progressive respiratory failure symptoms consistent with evolving COPD exacerbation with subsequent development of productive cough and bilateral infiltrates on chest x-ray suggestive of either pneumonia or bronchitis -Poor air movement on nasal cannula so have placed on continuous BiPAP -PCO2/PO2 appear to be at baseline ranges in the 60s although patient working hard from a respiratory standpoint to maintain during initial  evaluation-45+ minutes later went back to reevaluate patient: she is moving air better especially on right side but is very anxious and complaining the mask is tight therefore have given 0.5 mg Ativan IV 1 -Empiric Rocephin and Zithromax to treat infectious etiology -Solu-Medrol 60 mg IV every 6 hours -No evidence of heart failure but will check BNP; last echo in 2015 so will check echocardiogram especially focusing on evidence of cor pulmonale/pulmonary hypertension -Continue supportive care above and wean BiPAP as tolerated -Due to tremulousness of change scheduled nebs to Xopenex with DuoNeb prn for shortness of breath and tightness not relieved by Xopenex -Continue preadmission Pulmicort nebs acutely -Follow up on blood cultures, urinary strep and Legionella, HIV and sputum culture **1646: BNP 140  Active Problems:   Obesity hypoventilation syndrome  -On continuous BiPAP but once weaned we'll need to continue at bedtime BiPAP    Diabetes mellitus type 2, uncontrolled  -Current CBG greater than 200 and likely related to increase in steroid dose in the past 24 hours -While on continuous BiPAP will hold Glucotrol -Check CBGs every 4 hours and provide SSI -Hemoglobin A1c    SIRS (systemic inflammatory response syndrome)  -Patient presents with heart rate greater than 90 and elevated lactic acid but otherwise does not appear to be septic -Cycle lactic acid additional 2 collections-continue to cycle if does not normalize -Gentle IV fluid hydration while nothing by mouth **1646: Repeat lactic acid now up to 6.6 but Procalcitonin less than 0.10 and patient is otherwise hemodynamically stable-will give 1 L IV fluid bolus (then IV fluids at 125/HR) and check ABG stat **1710: repeat ABG pH 7.38, PCO2 58.3 and PO2 improved to 87-will check CK in follow-up on third lactic acid after fluid challenge and hydration    Depression -Was on Xanax prior to admission    Anemia, chronic  disease -Hemoglobin stable and around baseline of less than 11.5-10.8      DVT prophylaxis: Lovenox Code Status: Full code  Family Communication: Sister and daughter at bedside Disposition Plan: Anticipate discharge back to preadmission home environment once medically stable Consults called: None  Admission status: Inpatient/stepdown unit     ELLIS,ALLISON L. ANP-BC [facility] Pager [phone]   If 7PM-7AM, please contact night-coverage [email or web address] Password [email or web address]  02/03/2016, 4:21 PM

## 2016-02-03 NOTE — ED Notes (Signed)
Respiratory at bedside.

## 2016-02-03 NOTE — ED Triage Notes (Signed)
Per [facility]: Pts family took pt to clinic yesterday for her COPD, this morning the pt was having respiratory distress. Pt was laying flat in bed, rolling around, and confused. Pt was on Bipap when EMS arrived, pt wears this at night. Initial oxygen saturation was 77% on biPAP,  when flat in bed, they sat the pt up in bed and put the pt on NRB and sats increased 96%.  Pt received 2.5 of albuterol 500 mcg of atrovent. Pt received a steroid shot yesterday in the clinic. Pt is now alert and oriented X 4. NSR on monitor. Pt wears a nasal cannula at all times, pt currently on 4 L [place]. Pt lung sounds were initially diminished after the treatments the pt was "congested".

## 2016-02-04 ENCOUNTER — Inpatient Hospital Stay (HOSPITAL_COMMUNITY): Payer: Medicare HMO

## 2016-02-04 DIAGNOSIS — J9601 Acute respiratory failure with hypoxia: Secondary | ICD-10-CM

## 2016-02-04 DIAGNOSIS — E662 Morbid (severe) obesity with alveolar hypoventilation: Secondary | ICD-10-CM

## 2016-02-04 DIAGNOSIS — J9602 Acute respiratory failure with hypercapnia: Secondary | ICD-10-CM

## 2016-02-04 LAB — ECHOCARDIOGRAM COMPLETE
Height: 66 in
Weight: 3376 oz

## 2016-02-04 LAB — LACTIC ACID, PLASMA
Lactic Acid, Venous: 1.9 mmol/L (ref 0.5–1.9)
Lactic Acid, Venous: 1.2 mmol/L (ref 0.5–1.9)

## 2016-02-04 LAB — HIV ANTIBODY (ROUTINE TESTING W REFLEX): HIV SCREEN 4TH GENERATION: NONREACTIVE

## 2016-02-04 LAB — GLUCOSE, CAPILLARY
GLUCOSE-CAPILLARY: 197 mg/dL — AB (ref 65–99)
GLUCOSE-CAPILLARY: 149 mg/dL — AB (ref 65–99)
Glucose-Capillary: 224 mg/dL — ABNORMAL HIGH (ref 65–99)

## 2016-02-04 MED ORDER — INSULIN ASPART 100 UNIT/ML ~~LOC~~ SOLN
0.0000 [IU] | Freq: Every day | SUBCUTANEOUS | Status: DC
  Administered 2016-02-04: 0 [IU] via SUBCUTANEOUS
  Administered 2016-02-07 – 2016-02-08 (×2): 2 [IU] via SUBCUTANEOUS

## 2016-02-04 MED ORDER — CHLORHEXIDINE GLUCONATE 0.12 % MT SOLN
15.0000 mL | Freq: Two times a day (BID) | OROMUCOSAL | Status: DC
  Administered 2016-02-04: 15 mL via OROMUCOSAL
  Filled 2016-02-04: qty 15

## 2016-02-04 MED ORDER — IPRATROPIUM BROMIDE 0.02 % IN SOLN
0.5000 mg | Freq: Four times a day (QID) | RESPIRATORY_TRACT | Status: DC
  Administered 2016-02-04 – 2016-02-05 (×4): 0.5 mg via RESPIRATORY_TRACT
  Filled 2016-02-04 (×5): qty 2.5

## 2016-02-04 MED ORDER — ZOLPIDEM TARTRATE 5 MG PO TABS
5.0000 mg | ORAL_TABLET | Freq: Once | ORAL | Status: AC
  Administered 2016-02-04: 5 mg via ORAL
  Filled 2016-02-04: qty 1

## 2016-02-04 MED ORDER — ALPRAZOLAM 0.5 MG PO TABS
0.5000 mg | ORAL_TABLET | Freq: Three times a day (TID) | ORAL | Status: DC | PRN
  Administered 2016-02-04 – 2016-02-06 (×7): 0.5 mg via ORAL
  Filled 2016-02-04 (×8): qty 1

## 2016-02-04 MED ORDER — SODIUM CHLORIDE 0.9 % IV BOLUS (SEPSIS)
500.0000 mL | Freq: Once | INTRAVENOUS | Status: AC
  Administered 2016-02-04: 500 mL via INTRAVENOUS

## 2016-02-04 MED ORDER — INSULIN ASPART 100 UNIT/ML ~~LOC~~ SOLN
0.0000 [IU] | Freq: Three times a day (TID) | SUBCUTANEOUS | Status: DC
  Administered 2016-02-04: 3 [IU] via SUBCUTANEOUS
  Administered 2016-02-04: 5 [IU] via SUBCUTANEOUS
  Administered 2016-02-05: 3 [IU] via SUBCUTANEOUS
  Administered 2016-02-05 (×2): 5 [IU] via SUBCUTANEOUS
  Administered 2016-02-06 (×2): 3 [IU] via SUBCUTANEOUS
  Administered 2016-02-06 – 2016-02-08 (×3): 5 [IU] via SUBCUTANEOUS
  Administered 2016-02-08: 3 [IU] via SUBCUTANEOUS

## 2016-02-04 MED ORDER — HYDRALAZINE HCL 20 MG/ML IJ SOLN
10.0000 mg | Freq: Once | INTRAMUSCULAR | Status: AC | PRN
  Administered 2016-02-07: 10 mg via INTRAVENOUS
  Filled 2016-02-04 (×2): qty 1

## 2016-02-04 MED ORDER — CETYLPYRIDINIUM CHLORIDE 0.05 % MT LIQD: 7.0000 mL | Freq: Two times a day (BID) | OROMUCOSAL | Status: DC

## 2016-02-04 NOTE — Progress Notes (Signed)
This RN spoke with Carlyle Basques, on-call for infection prevention, concerning the patient's MRSA positive status.  The current MRSA PCR came back negative, however the patient has a positive MRSA history from 2012.  This information was relayed to the MD who advised that the patient could be removed from precautions.  She advised that this RN would be able to clear the MRSA infection tab from the top line information for the patient, however this is not the case. (This must be removed by Infection Prevention by hospital policy).  This RN has documented the removal of precautions for this patient and will inform day shift RN so that the chart can be amended correctly and according to policy.

## 2016-02-04 NOTE — Progress Notes (Addendum)
PROGRESS NOTE  AJAYA EVERAGE [id number] DOB: 28-Jun-1940 DOA: 02/03/2016 PCP: Romana Juniper, MD  Brief History:   75 year old female with a history of chronic respiratory failure on 4 L nasal cannula, obstructive sleep apnea, depression, COPD presented with one-week history of shortness of breath, coughing with yellow sputum, and wheezing. She went to see her primary care provider on 02/02/2016 where she was given a steroid injection and started on prednisone taper and antibiotics. She did not improve. The patient's family noted the patient was confused and restless on the morning of 02/03/2016 with oxygen saturation of 77%.[facility] was [facility] Upon presentation, the patient the patient was hypoxemic with oxygen saturation 82%. The patient was placed on BiPAP and given albuterol, Solu-Medrol, and Rocephin with azithromycin.  Assessment/Plan: Acute on chronic respiratory failure with hypoxia and hypercapnia -Secondary to COPD exacerbation -Patient has been weaned off of BiPAP, but will continue when necessary increase work of breathing -Patient is on 4 L nasal cannula at baseline, 6 L with activity  -Pulmonary hygiene  -Wean for oxygenation 88-92 percent   COPD exacerbation  -Continue ceftriaxone and azithromycin  -Continue Pulmicort  -Continue Xopenex  -Add Atrovent  -Continue intravenous Solu-Medrol   Lactic acidosis -Secondary to hypoxia and poor perfusion -Improved with fluid resuscitation -Decrease maintenance fluids  Diabetes mellitus type 2 -Hemoglobin A1c -Hold glipizide for now -NovoLog sliding scale  Depression/anxiety -Continue alprazolam when necessary     Disposition Plan:   Home in 1-2 days  Family Communication:   Daughters updated at bedside--Total time spent 35 minutes.  Greater than 50% spent face to face counseling and coordinating care.  0810-0845   Consultants: none   Code Status:  FULL  DVT Prophylaxis:  [place] Lovenox   Procedures: As  Listed in Progress Note Above  Antibiotics: Ceftriaxone/azithromycin 02/04/2016>>>    Subjective:  patient is breathing better but still having shortness of breath with movement. Denies any headache, chest pain, nausea, vomiting, diarrhea. Denies any fevers, chills, dysuria, hematuria. No hematochezia or melena.  Objective: Vitals:   02/04/16 0302 02/04/16 0445 02/04/16 0745 02/04/16 0834  BP:  (!) 127/95  (!) 148/78  Pulse: 68 69  96  Resp: 14 15  15   Temp:  98.5 F (36.9 C)  98.2 F (36.8 C)  TempSrc:  Oral  Oral  SpO2: 97% 98% 95% 95%  Weight:      Height:        Intake/Output Summary (Last 24 hours) at 02/04/16 0855 Last data filed at 02/04/16 0446  Gross per 24 hour  Intake          2574.58 ml  Output              200 ml  Net          2374.58 ml   Weight change:  Exam:   General:  Pt is alert, follows commands appropriately, not in acute distress  HEENT: No icterus, No thrush, No neck mass, [place]/AT  Cardiovascular: RRR, S1/S2, no rubs, no gallops  Respiratory: Bibasilar rales with scattered wheezing. Diminished breath sounds bilateral.   Abdomen: Soft/+BS, non tender, non distended, no guarding  Extremities: No edema, No lymphangitis, No petechiae, No rashes, no synovitis   Data Reviewed: I have personally reviewed following labs and imaging studies Basic Metabolic Panel:  Recent Labs Lab 02/03/16 1252  NA 136  K 4.9  CL 90*  CO2 38*  GLUCOSE 239*  BUN 18  CREATININE 1.10*  CALCIUM 9.2   Liver Function Tests: No results for input(s): AST, ALT, ALKPHOS, BILITOT, PROT, ALBUMIN in the last 168 hours. No results for input(s): LIPASE, AMYLASE in the last 168 hours. No results for input(s): AMMONIA in the last 168 hours. Coagulation Profile: No results for input(s): INR, PROTIME in the last 168 hours. CBC:  Recent Labs Lab 02/03/16 1252  WBC 6.9  NEUTROABS 6.2  HGB 10.8*  HCT 36.2  MCV 97.1  PLT 205   Cardiac Enzymes:  Recent Labs Lab  02/03/16 1252 02/03/16 2049  CKTOTAL  --  337*  TROPONINI 0.03*  --    BNP: Invalid input(s): POCBNP CBG: No results for input(s): GLUCAP in the last 168 hours. HbA1C: No results for input(s): HGBA1C in the last 72 hours. Urine analysis:    Component Value Date/Time   COLORURINE YELLOW 02/03/2016 2059   APPEARANCEUR CLEAR 02/03/2016 2059   LABSPEC 1.019 02/03/2016 2059   PHURINE 6.5 02/03/2016 2059   GLUCOSEU 100 (A) 02/03/2016 2059   HGBUR NEGATIVE 02/03/2016 2059   BILIRUBINUR NEGATIVE 02/03/2016 2059   KETONESUR NEGATIVE 02/03/2016 2059   PROTEINUR NEGATIVE 02/03/2016 2059   UROBILINOGEN 0.2 02/07/2011 1953   NITRITE NEGATIVE 02/03/2016 2059   LEUKOCYTESUR NEGATIVE 02/03/2016 2059   Sepsis Labs: @LABRCNTIP (procalcitonin:4,lacticidven:4) ) Recent Results (from the past 240 hour(s))  MRSA PCR Screening     Status: None   Collection Time: 02/03/16  8:32 PM  Result Value Ref Range Status   MRSA by PCR NEGATIVE NEGATIVE Final    Comment:        The GeneXpert MRSA Assay (FDA approved for NASAL specimens only), is one component of a comprehensive MRSA colonization surveillance program. It is not intended to diagnose MRSA infection nor to guide or monitor treatment for MRSA infections.      Scheduled Meds: . antiseptic oral rinse  7 mL Mouth Rinse q12n4p  . azithromycin  500 mg Intravenous Q24H  . budesonide  0.25 mg Nebulization BID  . cefTRIAXone (ROCEPHIN)  IV  1 g Intravenous Q24H  . chlorhexidine  15 mL Mouth Rinse BID  . enoxaparin (LOVENOX) injection  40 mg Subcutaneous Q24H  . levalbuterol  0.63 mg Nebulization Q6H  . methylPREDNISolone (SOLU-MEDROL) injection  60 mg Intravenous Q6H   Continuous Infusions: . sodium chloride 125 mL/hr at 02/03/16 1817    Procedures/Studies: Dg Chest Port 1 View  Result Date: 02/04/2016 CLINICAL DATA:  75 y/o  F; COPD with acute exacerbation. EXAM: PORTABLE CHEST 1 VIEW COMPARISON:  Chest radiograph dated 02/03/2016.  FINDINGS: The patient is partially rotated. There is upper lobe emphysema and hyperinflated lungs consistent with history of COPD. Patchy opacities at the lung bases probably represent or pneumonia. No pneumothorax or effusion. Cardiac silhouette is stable given differences in technique and rotation. No acute osseous abnormality is evident. IMPRESSION: Findings of COPD. Bibasilar opacities may represent atelectasis or pneumoniae. No pneumothorax or pleural effusion. Electronically Signed   By: Kristine Garbe M.D.   On: 02/04/2016 04:58   Dg Chest Portable 1 View  Result Date: 02/03/2016 CLINICAL DATA:  Shortness of breath for a week, history COPD, colon cancer EXAM: PORTABLE CHEST 1 VIEW COMPARISON:  Portable exam 1223 hours compared to 02/02/2016 FINDINGS: Upper normal heart size. Atherosclerotic calcification aorta. Mediastinal contours and pulmonary vascularity normal. Emphysematous changes with bibasilar infiltrates which could represent infection or edema, new since previous exam. Upper lungs clear. No pleural effusion or pneumothorax. Bones demineralized. IMPRESSION: COPD changes with  new bibasilar atelectasis versus infiltrate. Aortic atherosclerosis. Electronically Signed   By: Lavonia Dana M.D.   On: 02/03/2016 13:23    [place] Basham, DO  [facility] Pager [phone]  If 7PM-7AM, please contact night-coverage [email or web address] Password [email or web address] 02/04/2016, 8:55 AM   LOS: 1 day

## 2016-02-04 NOTE — Progress Notes (Signed)
[facility]  Patient Status: Active (receiving services up to time of hospitalization)  [facility] is providing the following services: RN, MSW and HHA  If patient discharges after hours, please call [phone].   Meredith [name] 02/04/2016, 10:06 AM

## 2016-02-04 NOTE — Progress Notes (Signed)
Pt was took off BIPAP and placed on 6 lpm [place].

## 2016-02-04 NOTE — Plan of Care (Signed)
Problem: Safety: Goal: Ability to remain free from injury will improve Outcome: Progressing Pt has demonstrated an increased ability to tolerate the BiPAP and has demonstrated an ability to call the RN when in need of assistance.

## 2016-02-05 ENCOUNTER — Encounter (HOSPITAL_COMMUNITY): Payer: Self-pay | Admitting: Pulmonary Disease

## 2016-02-05 DIAGNOSIS — J9621 Acute and chronic respiratory failure with hypoxia: Secondary | ICD-10-CM

## 2016-02-05 DIAGNOSIS — J441 Chronic obstructive pulmonary disease with (acute) exacerbation: Secondary | ICD-10-CM

## 2016-02-05 DIAGNOSIS — J9622 Acute and chronic respiratory failure with hypercapnia: Secondary | ICD-10-CM

## 2016-02-05 LAB — GLUCOSE, CAPILLARY
GLUCOSE-CAPILLARY: 182 mg/dL — AB (ref 65–99)
GLUCOSE-CAPILLARY: 221 mg/dL — AB (ref 65–99)
Glucose-Capillary: 218 mg/dL — ABNORMAL HIGH (ref 65–99)
Glucose-Capillary: 88 mg/dL (ref 65–99)

## 2016-02-05 LAB — LEGIONELLA PNEUMOPHILA SEROGP 1 UR AG: L. PNEUMOPHILA SEROGP 1 UR AG: NEGATIVE

## 2016-02-05 LAB — BASIC METABOLIC PANEL
ANION GAP: 7 (ref 5–15)
BUN: 26 mg/dL — ABNORMAL HIGH (ref 6–20)
CALCIUM: 8.7 mg/dL — AB (ref 8.9–10.3)
CO2: 35 mmol/L — ABNORMAL HIGH (ref 22–32)
CREATININE: 0.95 mg/dL (ref 0.44–1.00)
Chloride: 97 mmol/L — ABNORMAL LOW (ref 101–111)
GFR calc Af Amer: >60 mL/min (ref >60)
GFR, EST NON AFRICAN AMERICAN: 58 mL/min — AB (ref >60)
Glucose, Bld: 203 mg/dL — ABNORMAL HIGH (ref 65–99)
Potassium: 4.4 mmol/L (ref 3.5–5.1)
SODIUM: 139 mmol/L (ref 135–145)

## 2016-02-05 LAB — PROCALCITONIN

## 2016-02-05 MED ORDER — ACETAMINOPHEN 325 MG PO TABS
650.0000 mg | ORAL_TABLET | Freq: Four times a day (QID) | ORAL | Status: DC | PRN
  Administered 2016-02-05 – 2016-02-09 (×7): 650 mg via ORAL
  Filled 2016-02-05 (×7): qty 2

## 2016-02-05 MED ORDER — METHYLPREDNISOLONE SODIUM SUCC 125 MG IJ SOLR
60.0000 mg | Freq: Two times a day (BID) | INTRAMUSCULAR | Status: DC
  Administered 2016-02-05 – 2016-02-08 (×6): 60 mg via INTRAVENOUS
  Filled 2016-02-05 (×7): qty 2

## 2016-02-05 MED ORDER — LEVALBUTEROL HCL 0.63 MG/3ML IN NEBU
0.6300 mg | INHALATION_SOLUTION | RESPIRATORY_TRACT | Status: DC
  Administered 2016-02-05 – 2016-02-08 (×18): 0.63 mg via RESPIRATORY_TRACT
  Filled 2016-02-05 (×18): qty 3

## 2016-02-05 MED ORDER — ZOLPIDEM TARTRATE 5 MG PO TABS
5.0000 mg | ORAL_TABLET | Freq: Every evening | ORAL | Status: AC | PRN
  Administered 2016-02-05 – 2016-02-06 (×2): 5 mg via ORAL
  Filled 2016-02-05 (×2): qty 1

## 2016-02-05 MED ORDER — IPRATROPIUM BROMIDE 0.02 % IN SOLN
0.5000 mg | RESPIRATORY_TRACT | Status: DC
  Administered 2016-02-05 – 2016-02-08 (×18): 0.5 mg via RESPIRATORY_TRACT
  Filled 2016-02-05 (×18): qty 2.5

## 2016-02-05 NOTE — Consult Note (Signed)
Name: Meredith [name] MRN: [id number] DOB: 1941-02-25    ADMISSION DATE:  02/03/2016 CONSULTATION DATE:  02/05/2016  REFERRING MD :  Orson Eva, M.D.   CHIEF COMPLAINT:  COPD Exacerbation  SIGNIFICANT EVENTS  8/15 - Admit  STUDIES:  PFT (9/24/7):  FVC 1.92 L (60%) FEV1 0.75 L (32%) FEV1/FVC 0.39 Positive Bronchodilator Response TLC 4.87 L (92%) RV 145%  CXR PA/LAT 8/14:  Hyperinflation w/ mild bibasilar opacities. No pleural effusion. Heart normal in size. Normal mediastinal contour. Port CXR 8/16:  Slight worsening in bilateral basilar opacities with hyperinflation.  MICROBIOLOGY: MRSA PCR 8/15:  Negative HIV 8/15:  Negative Blood Ctx x2 8/15 >> Urine Strep Ag 8/15:  Negative  Urine Legionella Ag 8/15 >>  ANTIBIOTICS: Azithromycin 8/15 >> Rocephin 8/15 >>  HISTORY OF PRESENT ILLNESS:  75 y.o. Female patient of [facility] that has known steroid dependent very severe COPD as well as chronic hypoxic and chronic hypercarbic respiratory failure. Patient was found in respiratory distress per documentation at home. Her home health nurse reported last week that she had diminished breath sounds and audible wheezing as well as cough with a yellow phlegm at times but no subjective fever. Patient was seen by outside provider and given a "steroid injection" along with a Prednisone taper & antibiotic. Family found her confused at home in bed with BiPAP in place and saturation 77%. Patient has been saturating 95-100% in the last 24 hours and has continued to require NIPPV with BiPAP. PCCM was consulted to provide guidance in further treatment. Patient reports that she has no sick contacts or recent travel. She reports last Friday evening she was noted to have worsening hypoxia and confusion requiring BiPAP support nearly constantly. Family attempted to call EMS but patient declined. She reports her cough has always been nonproductive until lately this hospitalization. She was experiencing wheezing before but  denied any associated chest discomfort or tightness. She denied any sore throat or sinus congestion. She does reveal that her breathing is somewhat improved and has been off of BiPAP since this morning.  PAST MEDICAL HISTORY:  Past Medical History:  Diagnosis Date  . Anxiety   . Chronic respiratory failure with hypoxia and hypercapnia (HCC)    Nocturnal BiPAP / 4 L/m at rest & 5 L/m with exertion  . Colon cancer ([place])    colon cancer  . COPD, very severe ([place])   . DM type 2 (diabetes mellitus, type 2) (HCC)    steroid induced  . Insomnia     PAST SURGICAL HISTORY: Past Surgical History:  Procedure Laterality Date  . COLON SURGERY    . Cyst removed from Neck    . FOOT SURGERY Right     Prior to Admission medications   Medication Sig Start Date End Date Taking? Authorizing Provider  albuterol (PROVENTIL HFA;VENTOLIN HFA) 108 (90 Base) MCG/ACT inhaler Inhale 2 puffs into the lungs every 6 (six) hours as needed for wheezing or shortness of breath. 09/11/15  Yes Deneise Lever, MD  albuterol (PROVENTIL) (2.5 MG/3ML) 0.083% nebulizer solution Use 1 vial in nebulizer every 2-4 hours for  Shortness of breath and wheezing 04/14/15  Yes Deneise Lever, MD  ALPRAZolam Duanne Moron) 1 MG tablet Take 1 mg by mouth 3 (three) times daily as needed for anxiety or sleep. Take 1/2 to 1 tablet by mouth three times daily as needed   Yes Historical Provider, MD  budesonide (PULMICORT) 0.25 MG/2ML nebulizer solution Take 2 mLs (0.25 mg total) by  nebulization 2 (two) times daily. 09/20/13  Yes Deneise Lever, MD  glipiZIDE (GLUCOTROL XL) 2.5 MG 24 hr tablet Take 2.5 mg by mouth daily.     Yes Historical Provider, MD  guaiFENesin (MUCINEX) 600 MG 12 hr tablet Take 1,200 mg by mouth daily.    Yes Historical Provider, MD  ipratropium (ATROVENT) 0.02 % nebulizer solution Use in nebulizer four times daily as needed. Patient taking differently: Take by nebulization. Use in nebulizer four times daily as needed.  04/15/15  Yes Deneise Lever, MD  ipratropium (ATROVENT) 0.06 % nasal spray USE 1-2 SPRAYS IN EACH NOSTRIL 4 TIMES DAILY FOR RUNNY NOSE Patient taking differently: USE 1-2 SPRAYS IN EACH NOSTRIL twice daily as needed FOR RUNNY NOSE 07/26/14  Yes Deneise Lever, MD  KLOR-CON M20 20 MEQ tablet Take 20 mEq by mouth daily. 05/14/14  Yes Historical Provider, MD  levofloxacin (LEVAQUIN) 750 MG tablet Take 750 mg by mouth daily. 02/02/16  Yes Historical Provider, MD  Multiple Vitamins-Minerals (CENTRUM SILVER ADULT 50+) TABS Take 1 tablet by mouth daily.   Yes Historical Provider, MD  naproxen sodium (ANAPROX) 220 MG tablet Take 220 mg by mouth 2 (two) times daily as needed (for pain).   Yes Historical Provider, MD  nystatin (MYCOSTATIN) 100000 UNIT/ML suspension TAKE 5MLS BY MOUTH 4 TIMES A DAY Patient taking differently: Use as directed 5 mLs in the mouth or throat 2 (two) times daily as needed (for thrush).  01/15/15  Yes Deneise Lever, MD  predniSONE (DELTASONE) 20 MG tablet Take 20 mg by mouth See admin instructions. 20 mg (1 tablet) three (3) times a day on 02-02-16, 02-03-16, and 02-04-16, then 20 mg (1 tablet) two (2) times daily for 3 days, then 20 mg (1 tablet) one (1) time daily for three (3) days 02/02/16  Yes Historical Provider, MD  SYMBICORT 160-4.5 MCG/ACT inhaler USE 2 PUFFS TWICE DAILY 07/04/15  Yes Historical Provider, MD  triamcinolone ointment (KENALOG) 0.1 % Apply 1 application topically daily as needed (for itching).  12/01/15  Yes Historical Provider, MD  vitamin C (ASCORBIC ACID) 500 MG tablet Take 1,000 mg by mouth daily.    Yes Historical Provider, MD   Allergies  Allergen Reactions  . Morphine     Made high strung and have hallucination   . Tramadol Other (See Comments)    Makes her mean  . Zoloft [Sertraline Hcl] Other (See Comments)    Made her cry a lot    FAMILY HISTORY:  Family History  Problem Relation Age of Onset  . COPD Mother   . Heart attack Father   . COPD  Sister   . COPD Brother   . COPD Sister    SOCIAL HISTORY: Social History   Social History  . Marital status: Widowed    Spouse name: N/A  . Number of children: N/A  . Years of education: N/A   Occupational History  . retired    Social History Main Topics  . Smoking status: Former Smoker    Packs/day: 2.00    Years: 40.00    Types: Cigarettes    Quit date: 06/21/2008  . Smokeless tobacco: Former Systems developer    Quit date: 09/03/2008  . Alcohol use No  . Drug use: Unknown  . Sexual activity: Not Asked   Other Topics Concern  . None   Social History Narrative   Has a home nurse that visits twice weekly. No pets. No bird or mold exposure. No  recent travel.     REVIEW OF SYSTEMS:  Patient did have some diarrhea yesterday but denies any nausea or vomiting. She denies any headache or vision changes. A pertinent 14 point review of systems is negative except as per the HPI.  SUBJECTIVE: As above.  VITAL SIGNS: Temp:  [98.1 F (36.7 C)-99.1 F (37.3 C)] 98.7 F (37.1 C) (08/17 1155) Pulse Rate:  [56-106] 86 (08/17 1155) Resp:  [12-29] 29 (08/17 1155) BP: (141-185)/(71-107) 185/99 (08/17 1155) SpO2:  [90 %-100 %] 96 % (08/17 0937) FiO2 (%):  [40 %] 40 % (08/17 0937)  PHYSICAL EXAMINATION: General:  Awake. Alert. No acute distress. Family at bedside.  Integument:  Warm & dry. No rash on exposed skin. Lymphatics:  No appreciated cervical or supraclavicular lymphadenoapthy. HEENT:  Moist mucus membranes. No oral ulcers. No scleral injection or icterus. Venti mask in place. Cardiovascular:  Regular rate. No edema. No appreciable JVD.  Pulmonary:  Diminished breath sounds in bilateral lung bases. Normal work of breathing on Ventimask. Speaking in complete sentences. Abdomen: Soft. Normal bowel sounds. Protuberant. Grossly nontender. Musculoskeletal:  Normal bulk and tone. No joint deformity or effusion appreciated. Neurological:  CN 2-12 grossly in tact. No meningismus. Moving all 4  extremities equally.  Psychiatric:  Mood and affect congruent. Patient is tearful at times. Speech normal rhythm, rate & tone. Oriented to person, year, president, place, and situation.   Recent Labs Lab 02/03/16 1252 02/05/16 0436  NA 136 139  K 4.9 4.4  CL 90* 97*  CO2 38* 35*  BUN 18 26*  CREATININE 1.10* 0.95  GLUCOSE 239* 203*    Recent Labs Lab 02/03/16 1252  HGB 10.8*  HCT 36.2  WBC 6.9  PLT 205   Dg Chest Port 1 View  Result Date: 02/04/2016 CLINICAL DATA:  75 y/o  F; COPD with acute exacerbation. EXAM: PORTABLE CHEST 1 VIEW COMPARISON:  Chest radiograph dated 02/03/2016. FINDINGS: The patient is partially rotated. There is upper lobe emphysema and hyperinflated lungs consistent with history of COPD. Patchy opacities at the lung bases probably represent or pneumonia. No pneumothorax or effusion. Cardiac silhouette is stable given differences in technique and rotation. No acute osseous abnormality is evident. IMPRESSION: Findings of COPD. Bibasilar opacities may represent atelectasis or pneumoniae. No pneumothorax or pleural effusion. Electronically Signed   By: Kristine Garbe M.D.   On: 02/04/2016 04:58   Dg Chest Portable 1 View  Result Date: 02/03/2016 CLINICAL DATA:  Shortness of breath for a week, history COPD, colon cancer EXAM: PORTABLE CHEST 1 VIEW COMPARISON:  Portable exam 1223 hours compared to 02/02/2016 FINDINGS: Upper normal heart size. Atherosclerotic calcification aorta. Mediastinal contours and pulmonary vascularity normal. Emphysematous changes with bibasilar infiltrates which could represent infection or edema, new since previous exam. Upper lungs clear. No pleural effusion or pneumothorax. Bones demineralized. IMPRESSION: COPD changes with new bibasilar atelectasis versus infiltrate. Aortic atherosclerosis. Electronically Signed   By: Lavonia Dana M.D.   On: 02/03/2016 13:23    ASSESSMENT / PLAN:  75 year old female who follows with our practice  for very severe COPD and chronic steroid therapy. Patient does have chronic hypoxic and chronic hypercarbic respiratory failure treated with supplemental oxygen as well as nocturnal noninvasive positive pressure ventilation. Patient seems to have suffered an acute exacerbation of her COPD. She has no infectious symptoms or exposures that would suggest an infectious process as a precipitant and I suspect this is largely due to the increased heat and humidity experienced recently from our  previous mild weather. I had a lengthy discussion regarding CODE STATUS with the patient's family members at bedside answering their questions at length and discussing the patient's poor underlying pulmonary status. I suspect the hyperoxygenation the patient is receiving at this time is worsening her respiratory status to at least some degree. I discussed the change in her oxygen flow rate with her nurse as well as her current goals for saturation.  1. Acute on chronic hypoxic respiratory failure: Recommend continuing to wean FiO2 for saturation 88-94% to prevent V/Q mismatching. 2. Acute on chronic hypercarbic respiratory failure/COPD exacerbation: Switching Xopenex/Atrovent nebs to every 4 hours scheduled. Discontinuing budesonide. Continuing BiPAP nocturnal and when necessary for increased work of breathing. Changing Solu-Medrol to 60 mg IV every 12 hours. Plan to further decrease Solu-Medrol/steroids tomorrow depending upon progress. Patient would likely benefit from transitioning to Brovana/Pulmicort and budesonide nebulized twice daily as an outpatient. This can be reassessed prior to discharge. 3. Possible acute bronchitis: Urinary legionella antigen pending. Currently on Rocephin & azithromycin empirically day #3. Checking Procalcitonin per algorithm with low threshold to de-escalate to azithromycin for a total of 5 days. 4. CODE STATUS: Patient is DO NOT RESUSCITATE/DO NOT INTUBATE per my discussion with both she and  her family at bedside this afternoon which I feel is entirely reasonable.  We will continue to follow.  Sonia Baller Ashok Cordia, M.D. [facility] Pager:  [phone] After 3pm or if no response, call [phone] 02/05/2016, 12:21 PM

## 2016-02-05 NOTE — Progress Notes (Signed)
PROGRESS NOTE  Meredith [name] [id number] DOB: May 18, 1941 DOA: 02/03/2016 PCP: Romana Juniper, MD  Brief History:   75 year old female with a history of chronic respiratory failure on 4 L nasal cannula, obstructive sleep apnea, depression, COPD presented with one-week history of shortness of breath, coughing with yellow sputum, and wheezing. She went to see her primary care provider on 02/02/2016 where she was given a steroid injection and started on prednisone taper and antibiotics. She did not improve. The patient's family noted the patient was confused and restless on the morning of 02/03/2016 with oxygen saturation of 77%.[facility] Upon presentation, the patient the patient was hypoxemic with oxygen saturation 82%. The patient was placed on BiPAP and given albuterol, Solu-Medrol, and Rocephin with azithromycin.  Assessment/Plan: Acute on chronic respiratory failure with hypoxia and hypercapnia -Secondary to COPD exacerbation -Patient has been weaned off of BiPAP, but will continue when necessary increase work of breathing -Patient is on 4 L nasal cannula at baseline, 6 L with activity  -Pulmonary hygiene  -Wean for oxygenation 88-92 percent  -consult pulmonary due to slow improvement -still requiring prn BiPAP  COPD exacerbation  -Continue ceftriaxone and azithromycin  -Continue Pulmicort  -Continue Xopenex/Atrovent -Continue intravenous Solu-Medrol   Lactic acidosis -Secondary to hypoxia and poor perfusion -Improved with fluid resuscitation -improved -Decrease maintenance fluids  Diabetes mellitus type 2 -Hemoglobin A1c--pending -Hold glipizide for now -NovoLog sliding scale  Depression/anxiety -Continue alprazolam when necessary   Disposition Plan:   Home in 2-3 days  Family Communication:  Updated daughter Lurline Del) at bedside--Total time spent 35 minutes.  Greater than 50% spent face to face counseling and coordinating  care.   Consultants:  Pulmonary  Code Status:  FULL  DVT Prophylaxis:  [place] Lovenox   Procedures: As Listed in Progress Note Above  Antibiotics: Ceftriaxone 8/15>>> Azithromycin 8/15>>>    Subjective: Patient was shortness of breath. She states that her breathing is much better than yesterday. Required some BiPAP overnight. Denies any nausea, vomiting, diarrhea, abdominal pain. No dysuria or hematuria. No rashes.  Objective: Vitals:   02/05/16 0258 02/05/16 0727 02/05/16 0935 02/05/16 0937  BP:  (!) 150/71    Pulse: (!) 56 69    Resp: 12 18    Temp:  98.5 F (36.9 C)    TempSrc:  Oral    SpO2:  100% 95% 96%  Weight:      Height:        Intake/Output Summary (Last 24 hours) at 02/05/16 1057 Last data filed at 02/05/16 0930  Gross per 24 hour  Intake             2535 ml  Output              250 ml  Net             2285 ml   Weight change:  Exam:   General:  Pt is alert, follows commands appropriately, not in acute distress  HEENT: No icterus, No thrush, No neck mass, [place]/AT  Cardiovascular: RRR, S1/S2, no rubs, no gallops  Respiratory: Diminished breath sounds bilateral. Bibasilar rales. Bilateral expiratory wheeze.  Abdomen: Soft/+BS, non tender, non distended, no guarding  Extremities: No edema, No lymphangitis, No petechiae, No rashes, no synovitis   Data Reviewed: I have personally reviewed following labs and imaging studies Basic Metabolic Panel:  Recent Labs Lab 02/03/16 1252 02/05/16 0436  NA 136 139  K 4.9 4.4  CL 90* 97*  CO2 38* 35*  GLUCOSE 239* 203*  BUN 18 26*  CREATININE 1.10* 0.95  CALCIUM 9.2 8.7*   Liver Function Tests: No results for input(s): AST, ALT, ALKPHOS, BILITOT, PROT, ALBUMIN in the last 168 hours. No results for input(s): LIPASE, AMYLASE in the last 168 hours. No results for input(s): AMMONIA in the last 168 hours. Coagulation Profile: No results for input(s): INR, PROTIME in the last 168 hours. CBC:  Recent  Labs Lab 02/03/16 1252  WBC 6.9  NEUTROABS 6.2  HGB 10.8*  HCT 36.2  MCV 97.1  PLT 205   Cardiac Enzymes:  Recent Labs Lab 02/03/16 1252 02/03/16 2049  CKTOTAL  --  337*  TROPONINI 0.03*  --    BNP: Invalid input(s): POCBNP CBG:  Recent Labs Lab 02/04/16 1230 02/04/16 1714 02/04/16 2138 02/05/16 0725  GLUCAP 224* 197* 149* 182*   HbA1C: No results for input(s): HGBA1C in the last 72 hours. Urine analysis:    Component Value Date/Time   COLORURINE YELLOW 02/03/2016 2059   APPEARANCEUR CLEAR 02/03/2016 2059   LABSPEC 1.019 02/03/2016 2059   PHURINE 6.5 02/03/2016 2059   GLUCOSEU 100 (A) 02/03/2016 2059   HGBUR NEGATIVE 02/03/2016 2059   BILIRUBINUR NEGATIVE 02/03/2016 2059   KETONESUR NEGATIVE 02/03/2016 2059   PROTEINUR NEGATIVE 02/03/2016 2059   UROBILINOGEN 0.2 02/07/2011 1953   NITRITE NEGATIVE 02/03/2016 2059   LEUKOCYTESUR NEGATIVE 02/03/2016 2059   Sepsis Labs: @LABRCNTIP (procalcitonin:4,lacticidven:4) ) Recent Results (from the past 240 hour(s))  Blood culture (routine x 2)     Status: None (Preliminary result)   Collection Time: 02/03/16  2:26 PM  Result Value Ref Range Status   Specimen Description BLOOD LEFT FOREARM  Final   Special Requests BOTTLES DRAWN AEROBIC AND ANAEROBIC 5CC  Final   Culture NO GROWTH < 24 HOURS  Final   Report Status PENDING  Incomplete  MRSA PCR Screening     Status: None   Collection Time: 02/03/16  8:32 PM  Result Value Ref Range Status   MRSA by PCR NEGATIVE NEGATIVE Final    Comment:        The GeneXpert MRSA Assay (FDA approved for NASAL specimens only), is one component of a comprehensive MRSA colonization surveillance program. It is not intended to diagnose MRSA infection nor to guide or monitor treatment for MRSA infections.      Scheduled Meds: . azithromycin  500 mg Intravenous Q24H  . budesonide  0.25 mg Nebulization BID  . cefTRIAXone (ROCEPHIN)  IV  1 g Intravenous Q24H  . enoxaparin  (LOVENOX) injection  40 mg Subcutaneous Q24H  . insulin aspart  0-15 Units Subcutaneous TID WC  . insulin aspart  0-5 Units Subcutaneous QHS  . ipratropium  0.5 mg Nebulization Q6H  . levalbuterol  0.63 mg Nebulization Q6H  . methylPREDNISolone (SOLU-MEDROL) injection  60 mg Intravenous Q6H   Continuous Infusions: . sodium chloride 50 mL/hr at 02/04/16 [id number]    Procedures/Studies: Dg Chest Port 1 View  Result Date: 02/04/2016 CLINICAL DATA:  75 y/o  F; COPD with acute exacerbation. EXAM: PORTABLE CHEST 1 VIEW COMPARISON:  Chest radiograph dated 02/03/2016. FINDINGS: The patient is partially rotated. There is upper lobe emphysema and hyperinflated lungs consistent with history of COPD. Patchy opacities at the lung bases probably represent or pneumonia. No pneumothorax or effusion. Cardiac silhouette is stable given differences in technique and rotation. No acute osseous abnormality is evident. IMPRESSION: Findings of COPD. Bibasilar opacities may represent atelectasis or  pneumoniae. No pneumothorax or pleural effusion. Electronically Signed   By: Kristine Garbe M.D.   On: 02/04/2016 04:58   Dg Chest Portable 1 View  Result Date: 02/03/2016 CLINICAL DATA:  Shortness of breath for a week, history COPD, colon cancer EXAM: PORTABLE CHEST 1 VIEW COMPARISON:  Portable exam 1223 hours compared to 02/02/2016 FINDINGS: Upper normal heart size. Atherosclerotic calcification aorta. Mediastinal contours and pulmonary vascularity normal. Emphysematous changes with bibasilar infiltrates which could represent infection or edema, new since previous exam. Upper lungs clear. No pleural effusion or pneumothorax. Bones demineralized. IMPRESSION: COPD changes with new bibasilar atelectasis versus infiltrate. Aortic atherosclerosis. Electronically Signed   By: Lavonia Dana M.D.   On: 02/03/2016 13:23    Dreanna Kyllo, DO  [facility] Pager [phone]  If 7PM-7AM, please contact  night-coverage [email or web address] Password [email or web address] 02/05/2016, 10:57 AM   LOS: 2 days

## 2016-02-06 DIAGNOSIS — J209 Acute bronchitis, unspecified: Secondary | ICD-10-CM

## 2016-02-06 DIAGNOSIS — J9621 Acute and chronic respiratory failure with hypoxia: Secondary | ICD-10-CM

## 2016-02-06 DIAGNOSIS — R0902 Hypoxemia: Secondary | ICD-10-CM

## 2016-02-06 LAB — HEMOGLOBIN A1C
Hgb A1c MFr Bld: 6.5 % — ABNORMAL HIGH (ref 4.8–5.6)
Mean Plasma Glucose: 140 mg/dL

## 2016-02-06 LAB — GLUCOSE, CAPILLARY
GLUCOSE-CAPILLARY: 186 mg/dL — AB (ref 65–99)
GLUCOSE-CAPILLARY: 158 mg/dL — AB (ref 65–99)
Glucose-Capillary: 241 mg/dL — ABNORMAL HIGH (ref 65–99)
Glucose-Capillary: 166 mg/dL — ABNORMAL HIGH (ref 65–99)

## 2016-02-06 MED ORDER — ALPRAZOLAM 0.5 MG PO TABS
0.5000 mg | ORAL_TABLET | Freq: Four times a day (QID) | ORAL | Status: DC | PRN
  Administered 2016-02-06 – 2016-02-09 (×12): 0.5 mg via ORAL
  Filled 2016-02-06 (×13): qty 1

## 2016-02-06 NOTE — Progress Notes (Signed)
PROGRESS NOTE  Meredith [name] [id number] DOB: July 10, 1940 DOA: 02/03/2016 PCP: Romana Juniper, MD  Brief History:  75 year old female with a history of chronic respiratory failure on 4 L nasal cannula, obstructive sleep apnea, depression, COPD presented with one-week history of shortness of breath, coughing with yellow sputum, and wheezing. She went to see her primary care provider on 02/02/2016 where she was given a steroid injection and started on prednisone taper and antibiotics. She did not improve. The patient's family noted the patient was confused and restless on the morning of 02/03/2016 with oxygen saturation of 77%.[facility] Upon presentation, the patient the patient was hypoxemic with oxygen saturation 82%. The patient was placed on BiPAP and given albuterol, Solu-Medrol, and Rocephin with azithromycin.  Assessment/Plan: Acute on chronic respiratory failure with hypoxia and hypercapnia -Secondary to COPD exacerbation -Patient has been weaned off of BiPAP, but will continue when necessary increase work of breathing -Patient is on 4 L nasal cannula at baseline, 6 L with activity  -Pulmonary hygiene  -Wean for oxygenation 88-92 percent  -appreciate pulmonary consult  COPD exacerbation  -Continue ceftriaxone and azithromycin--D#4 of 5 -Continue Xopenex/Atrovent q 4 hours -Continue intravenous Solu-Medrol--wean per pulmonary  Lactic acidosis -Secondary to hypoxia and poor perfusion -Improved with fluid resuscitation -improved -saline lock IVF  Diabetes mellitus type 2 -Hemoglobin A1c--pending -Hold glipizide for now -NovoLog sliding scale  Depression/anxiety -Continue alprazolam when necessary   Disposition Plan:   Home in 2-3 days  Family Communication:  Updated daughter Lurline Del) at bedside 8/18  Consultants:  Pulmonary  Code Status:  FULL  DVT Prophylaxis:  [place] Lovenox   Procedures: As Listed in Progress Note  Above  Antibiotics: Ceftriaxone 8/15>>> Azithromycin 8/15>>>    Subjective: Patient assures breathing better today. Denies any chest pain, nausea, vomiting, diarrhea, abdominal pain. No dysuria or hematuria. No rashes.  Objective: Vitals:   02/06/16 0400 02/06/16 0402 02/06/16 0759 02/06/16 0858  BP: (!) 145/78  (!) 158/87   Pulse: 92 92 74   Resp: (!) 28 16 16    Temp:  98.6 F (37 C) 98.5 F (36.9 C)   TempSrc:  Oral Oral   SpO2: (!) 83% 91% 95% 96%  Weight:      Height:        Intake/Output Summary (Last 24 hours) at 02/06/16 1047 Last data filed at 02/06/16 0959  Gross per 24 hour  Intake             1340 ml  Output             2200 ml  Net             -860 ml   Weight change:  Exam:   General:  Pt is alert, follows commands appropriately, not in acute distress  HEENT: No icterus, No thrush, No neck mass, [place]/AT  Cardiovascular: RRR, S1/S2, no rubs, no gallops  Respiratory: Scattered rales bilaterally without wheezing. Good air movement; diminished breath sounds bilateral  Abdomen: Soft/+BS, non tender, non distended, no guarding  Extremities: No edema, No lymphangitis, No petechiae, No rashes, no synovitis   Data Reviewed: I have personally reviewed following labs and imaging studies Basic Metabolic Panel:  Recent Labs Lab 02/03/16 1252 02/05/16 0436  NA 136 139  K 4.9 4.4  CL 90* 97*  CO2 38* 35*  GLUCOSE 239* 203*  BUN 18 26*  CREATININE 1.10* 0.95  CALCIUM 9.2 8.7*   Liver Function  Tests: No results for input(s): AST, ALT, ALKPHOS, BILITOT, PROT, ALBUMIN in the last 168 hours. No results for input(s): LIPASE, AMYLASE in the last 168 hours. No results for input(s): AMMONIA in the last 168 hours. Coagulation Profile: No results for input(s): INR, PROTIME in the last 168 hours. CBC:  Recent Labs Lab 02/03/16 1252  WBC 6.9  NEUTROABS 6.2  HGB 10.8*  HCT 36.2  MCV 97.1  PLT 205   Cardiac Enzymes:  Recent Labs Lab  02/03/16 1252 02/03/16 2049  CKTOTAL  --  337*  TROPONINI 0.03*  --    BNP: Invalid input(s): POCBNP CBG:  Recent Labs Lab 02/05/16 0725 02/05/16 1153 02/05/16 1600 02/05/16 2109 02/06/16 0758  GLUCAP 182* 221* 218* 88 186*   HbA1C:  Recent Labs  02/05/16 0436  HGBA1C 6.5*   Urine analysis:    Component Value Date/Time   COLORURINE YELLOW 02/03/2016 2059   APPEARANCEUR CLEAR 02/03/2016 2059   LABSPEC 1.019 02/03/2016 2059   PHURINE 6.5 02/03/2016 2059   GLUCOSEU 100 (A) 02/03/2016 2059   HGBUR NEGATIVE 02/03/2016 2059   BILIRUBINUR NEGATIVE 02/03/2016 2059   KETONESUR NEGATIVE 02/03/2016 2059   PROTEINUR NEGATIVE 02/03/2016 2059   UROBILINOGEN 0.2 02/07/2011 1953   NITRITE NEGATIVE 02/03/2016 2059   LEUKOCYTESUR NEGATIVE 02/03/2016 2059   Sepsis Labs: @LABRCNTIP (procalcitonin:4,lacticidven:4) ) Recent Results (from the past 240 hour(s))  Blood culture (routine x 2)     Status: None (Preliminary result)   Collection Time: 02/03/16  2:26 PM  Result Value Ref Range Status   Specimen Description BLOOD LEFT FOREARM  Final   Special Requests BOTTLES DRAWN AEROBIC AND ANAEROBIC 5CC  Final   Culture NO GROWTH 2 DAYS  Final   Report Status PENDING  Incomplete  Blood culture (routine x 2)     Status: None (Preliminary result)   Collection Time: 02/03/16  2:34 PM  Result Value Ref Range Status   Specimen Description BLOOD RIGHT HAND  Final   Special Requests BOTTLES DRAWN AEROBIC AND ANAEROBIC 5CC  Final   Culture NO GROWTH 1 DAY  Final   Report Status PENDING  Incomplete  MRSA PCR Screening     Status: None   Collection Time: 02/03/16  8:32 PM  Result Value Ref Range Status   MRSA by PCR NEGATIVE NEGATIVE Final    Comment:        The GeneXpert MRSA Assay (FDA approved for NASAL specimens only), is one component of a comprehensive MRSA colonization surveillance program. It is not intended to diagnose MRSA infection nor to guide or monitor treatment  for MRSA infections.      Scheduled Meds: . azithromycin  500 mg Intravenous Q24H  . cefTRIAXone (ROCEPHIN)  IV  1 g Intravenous Q24H  . enoxaparin (LOVENOX) injection  40 mg Subcutaneous Q24H  . insulin aspart  0-15 Units Subcutaneous TID WC  . insulin aspart  0-5 Units Subcutaneous QHS  . ipratropium  0.5 mg Nebulization Q4H  . levalbuterol  0.63 mg Nebulization Q4H  . methylPREDNISolone (SOLU-MEDROL) injection  60 mg Intravenous Q12H   Continuous Infusions: . sodium chloride 50 mL/hr at 02/04/16 [id number]    Procedures/Studies: Dg Chest Port 1 View  Result Date: 02/04/2016 CLINICAL DATA:  75 y/o  F; COPD with acute exacerbation. EXAM: PORTABLE CHEST 1 VIEW COMPARISON:  Chest radiograph dated 02/03/2016. FINDINGS: The patient is partially rotated. There is upper lobe emphysema and hyperinflated lungs consistent with history of COPD. Patchy opacities at the lung bases probably  represent or pneumonia. No pneumothorax or effusion. Cardiac silhouette is stable given differences in technique and rotation. No acute osseous abnormality is evident. IMPRESSION: Findings of COPD. Bibasilar opacities may represent atelectasis or pneumoniae. No pneumothorax or pleural effusion. Electronically Signed   By: Kristine Garbe M.D.   On: 02/04/2016 04:58   Dg Chest Portable 1 View  Result Date: 02/03/2016 CLINICAL DATA:  Shortness of breath for a week, history COPD, colon cancer EXAM: PORTABLE CHEST 1 VIEW COMPARISON:  Portable exam 1223 hours compared to 02/02/2016 FINDINGS: Upper normal heart size. Atherosclerotic calcification aorta. Mediastinal contours and pulmonary vascularity normal. Emphysematous changes with bibasilar infiltrates which could represent infection or edema, new since previous exam. Upper lungs clear. No pleural effusion or pneumothorax. Bones demineralized. IMPRESSION: COPD changes with new bibasilar atelectasis versus infiltrate. Aortic atherosclerosis. Electronically Signed    By: Lavonia Dana M.D.   On: 02/03/2016 13:23    Dionne Knoop, DO  [facility] Pager [phone]  If 7PM-7AM, please contact night-coverage [email or web address] Password [email or web address] 02/06/2016, 10:47 AM   LOS: 3 days

## 2016-02-06 NOTE — Plan of Care (Signed)
Problem: Health Behavior/Discharge Planning: Goal: Ability to manage health-related needs will improve Outcome: Not Progressing Pt is discounting the need for hospitalization and is insistent that she no longer needs to be hospitalized.  She is able to be redirected and educated on the severity of her condition, however this is limited by her anxiety and constant thought that she is not being given the amount of Xanax that she takes when at home.

## 2016-02-06 NOTE — Progress Notes (Signed)
Name: Meredith [name] MRN: [id number] DOB: 11/13/1940    ADMISSION DATE:  02/03/2016 CONSULTATION DATE:  02/05/2016  REFERRING MD :  Orson Eva, M.D.   CHIEF COMPLAINT:  COPD Exacerbation  SIGNIFICANT EVENTS  8/15 - Admit  STUDIES:  PFT (9/24/7):  FVC 1.92 L (60%) FEV1 0.75 L (32%) FEV1/FVC 0.39 Positive Bronchodilator Response TLC 4.87 L (92%) RV 145%  CXR PA/LAT 8/14:  Hyperinflation w/ mild bibasilar opacities. No pleural effusion. Heart normal in size. Normal mediastinal contour. Port CXR 8/16:  Slight worsening in bilateral basilar opacities with hyperinflation.  MICROBIOLOGY: MRSA PCR 8/15:  Negative HIV 8/15:  Negative Blood Ctx x2 8/15 >> Urine Strep Ag 8/15:  Negative  Urine Legionella Ag 8/15 >>  ANTIBIOTICS: Azithromycin 8/15 >> Rocephin 8/15 >>  HISTORY OF PRESENT ILLNESS:  75 y.o. Female patient of [facility] that has known steroid dependent very severe COPD as well as chronic hypoxic and chronic hypercarbic respiratory failure. Patient was found in respiratory distress per documentation at home. Her home health nurse reported last week that she had diminished breath sounds and audible wheezing as well as cough with a yellow phlegm at times but no subjective fever. Patient was seen by outside provider and given a "steroid injection" along with a Prednisone taper & antibiotic. Family found her confused at home in bed with BiPAP in place and saturation 77%. Patient has been saturating 95-100% in the last 24 hours and has continued to require NIPPV with BiPAP. PCCM was consulted to provide guidance in further treatment. Patient reports that she has no sick contacts or recent travel. She reports last Friday evening she was noted to have worsening hypoxia and confusion requiring BiPAP support nearly constantly. Family attempted to call EMS but patient declined. She reports her cough has always been nonproductive until lately this hospitalization. She was experiencing wheezing before but  denied any associated chest discomfort or tightness. She denied any sore throat or sinus congestion. She does reveal that her breathing is somewhat improved and has been off of BiPAP since this morning.   SUBJECTIVE: Anxious  VITAL SIGNS: Temp:  [98.4 F (36.9 C)-98.7 F (37.1 C)] 98.5 F (36.9 C) (08/18 0759) Pulse Rate:  [74-120] 74 (08/18 0759) Resp:  [16-29] 16 (08/18 0759) BP: (145-185)/(77-99) 158/87 (08/18 0759) SpO2:  [83 %-100 %] 96 % (08/18 0858)  PHYSICAL EXAMINATION: General:  Awake. Alert. Air hungry after getting up to [facility].. Family at bedside. Anxious.  Integument:  Warm & dry. No rash on exposed skin. Lymphatics:  No appreciated cervical or supraclavicular lymphadenoapthy. HEENT:  Moist mucus membranes. No oral ulcers. No scleral injection or icterus. [place] in place. Cardiovascular:  Regular rate. No edema. No appreciable JVD.  Pulmonary:  Diminished breath sounds in bilateral lung bases. Increased  work of breathing on [place] Speaking in complete sentences. Abdomen: Soft. Normal bowel sounds. Protuberant. Grossly nontender. Musculoskeletal:  Normal bulk and tone. No joint deformity or effusion appreciated. Neurological:  CN 2-12 grossly in tact. No meningismus. Moving all 4 extremities equally. Anxious Psychiatric:  Mood and affect congruent. Patient is tearful at times. Speech normal rhythm, rate & tone. Oriented to person, year, president, place, and situation.   Recent Labs Lab 02/03/16 1252 02/05/16 0436  NA 136 139  K 4.9 4.4  CL 90* 97*  CO2 38* 35*  BUN 18 26*  CREATININE 1.10* 0.95  GLUCOSE 239* 203*    Recent Labs Lab 02/03/16 1252  HGB 10.8*  HCT 36.2  WBC  6.9  PLT 205   No results found.  ASSESSMENT / PLAN:  75 year old female who follows with our practice for very severe COPD and chronic steroid therapy. Patient does have chronic hypoxic and chronic hypercarbic respiratory failure treated with supplemental oxygen as well as nocturnal noninvasive  positive pressure ventilation. Patient seems to have suffered an acute exacerbation of her COPD. She has no infectious symptoms or exposures that would suggest an infectious process as a precipitant and I suspect this is largely due to the increased heat and humidity experienced recently from our previous mild weather. I (Dr. Ashok Cordia on 8/17)had a lengthy discussion regarding CODE STATUS with the patient's family members at bedside answering their questions at length and discussing the patient's poor underlying pulmonary status. I suspect the hyperoxygenation the patient is receiving at this time is worsening her respiratory status to at least some degree. I discussed the change in her oxygen flow rate with her nurse as well as her current goals for saturation. 8/18 there is speculation she was using Xanax more frequently and this may be contributing to her anxiety.  1. Acute on chronic hypoxic respiratory failure: Recommend continuing to wean FiO2 for saturation 88-94% to prevent V/Q mismatching. 2. Acute on chronic hypercarbic respiratory failure/COPD exacerbation: Switching Xopenex/Atrovent nebs to every 4 hours scheduled. Discontinuing budesonide. Continuing BiPAP nocturnal and when necessary for increased work of breathing. Changing Solu-Medrol to 60 mg IV every 12 hours. Plan to further decrease Solu-Medrol/steroids tomorrow depending upon progress. Patient would likely benefit from transitioning to Brovana/Pulmicort and budesonide nebulized twice daily as an outpatient. This can be reassessed prior to discharge. 3. Possible acute bronchitis: Urinary legionella antigen pending. Currently on Rocephin & azithromycin empirically day #3. Checking Procalcitonin per algorithm with low threshold to de-escalate to azithromycin for a total of 5 days. 4. CODE STATUS: Patient is DO NOT RESUSCITATE/DO NOT INTUBATE per my discussion with both she and her family at bedside this afternoon which I feel is entirely  reasonable. 5. Increase Xanax to Q6h prn.   Richardson Landry Minor ACNP Maryanna Shape PCCM Pager [phone] till 3 pm If no answer page [phone] 02/06/2016, 10:11 AM  Attending Note:  75 year old female with history of COPD following in the pulmonary clinic.  Presenting with a COPD exacerbation.  On chronic O2 and steroids therapy.  Chronically hypoxemic.  On exam, very diminished.  I reviewed CXR myself, hyperinflation noted.  Discussed with PCCM-NP.  Acute on chronic hypoxemic respiratory failure:  - Titrate O2 for sat of 88-92%.   - Ambulatory desaturation when better.  - Nocturnal BiPAP.  COPD exacerbation:  - Nocturnal BiPAP.  - Steroids.  - Bronchodilators as ordered.  Bronchitis:  - F/U on cultures.  - Rocephin/zithromax.  Code status:  - DNR per patient and family.  PCCM will see again on Monday.  Patient seen and examined, agree with above note.  I dictated the care and orders written for this patient under my direction.  Rush Farmer, MD [phone]

## 2016-02-06 NOTE — Care Management Important Message (Signed)
Important Message  Patient Details  Name: Meredith [name] MRN: [id number] Date of Birth: 1941-05-10   Medicare Important Message Given:  Yes    Nathen May 02/06/2016, 10:34 AM

## 2016-02-07 LAB — CBC
HCT: 35.8 % — ABNORMAL LOW (ref 36.0–46.0)
Hemoglobin: 10.9 g/dL — ABNORMAL LOW (ref 12.0–15.0)
MCH: 28.8 pg (ref 26.0–34.0)
MCHC: 30.4 g/dL (ref 30.0–36.0)
MCV: 94.5 fL (ref 78.0–100.0)
PLATELETS: 218 10*3/uL (ref 150–400)
RBC: 3.79 MIL/uL — AB (ref 3.87–5.11)
RDW: 14.1 % (ref 11.5–15.5)
WBC: 9.1 10*3/uL (ref 4.0–10.5)

## 2016-02-07 LAB — BASIC METABOLIC PANEL
Anion gap: 7 (ref 5–15)
BUN: 25 mg/dL — ABNORMAL HIGH (ref 6–20)
CALCIUM: 9.5 mg/dL (ref 8.9–10.3)
CHLORIDE: 92 mmol/L — AB (ref 101–111)
CO2: 39 mmol/L — AB (ref 22–32)
CREATININE: 1.05 mg/dL — AB (ref 0.44–1.00)
GFR calc Af Amer: 60 mL/min — ABNORMAL LOW (ref >60)
GFR, EST NON AFRICAN AMERICAN: 51 mL/min — AB (ref >60)
GLUCOSE: 162 mg/dL — AB (ref 65–99)
Potassium: 4.4 mmol/L (ref 3.5–5.1)
Sodium: 138 mmol/L (ref 135–145)

## 2016-02-07 LAB — GLUCOSE, CAPILLARY
GLUCOSE-CAPILLARY: 213 mg/dL — AB (ref 65–99)
GLUCOSE-CAPILLARY: 95 mg/dL (ref 65–99)
GLUCOSE-CAPILLARY: 209 mg/dL — AB (ref 65–99)
Glucose-Capillary: 119 mg/dL — ABNORMAL HIGH (ref 65–99)

## 2016-02-07 LAB — PROCALCITONIN: Procalcitonin: <0.1 ng/mL

## 2016-02-07 MED ORDER — AZITHROMYCIN 500 MG PO TABS
500.0000 mg | ORAL_TABLET | Freq: Once | ORAL | Status: AC
  Administered 2016-02-07: 500 mg via ORAL
  Filled 2016-02-07: qty 1

## 2016-02-07 NOTE — Progress Notes (Signed)
PROGRESS NOTE  Meredith [name] [id number] DOB: 01-18-1941 DOA: 02/03/2016 PCP: Romana Juniper, MD   Brief History: 75 year old female with a history of chronic respiratory failure on 4 L nasal cannula, obstructive sleep apnea, depression, COPD presented with one-week history of shortness of breath, coughing with yellow sputum, and wheezing. She went to see her primary care provider on 02/02/2016 where she was given a steroid injection and started on prednisone taper and antibiotics. She did not improve. The patient's family noted the patient was confused and restless on the morning of 02/03/2016 with oxygen saturation of 77%.[facility] Upon presentation, the patient the patient was hypoxemic with oxygen saturation 82%. The patient was placed on BiPAP and given albuterol, Solu-Medrol, and Rocephin with azithromycin.  Assessment/Plan: Acute on chronic respiratory failure with hypoxia and hypercapnia -Secondary to COPD exacerbation -Patient has been weaned off of BiPAP, but will continue when necessary increase work of breathing -Patient is on 4 L nasal cannula at baseline, 6 L with activity  -Pulmonary hygiene  -Wean for oxygenation 88-92 percent  -appreciate pulmonary consult  COPD exacerbation  -Continue ceftriaxone and azithromycin--D#5 of 5 -Continue Xopenex/Atrovent q 4 hours -Continue intravenous Solu-Medrol--wean per pulmonary -pt refusing BiPAP at night  Lactic acidosis -Secondary to hypoxia and poor perfusion -Improved with fluid resuscitation -improved -saline lock IVF  Diabetes mellitus type 2 -02/05/16--Hemoglobin A1c--6.5 -Hold glipizide for now -NovoLog sliding scale  Depression/anxiety -Continue alprazolam when necessary   Disposition Plan: Home 8/21 if continues to improve Family Communication: Updated daughter  at bedside 8/19  Consultants: Pulmonary  Code Status: FULL  DVT Prophylaxis: [place]  Lovenox   Procedures: As Listed in Progress Note Above  Antibiotics: Ceftriaxone 8/15>>> Azithromycin 8/15>>>    Subjective: Patient denies fevers, chills, headache, chest pain, dyspnea, nausea, vomiting, diarrhea, abdominal pain, dysuria, hematuria, hematochezia, and melena.   Objective: Vitals:   02/07/16 0037 02/07/16 0342 02/07/16 0726 02/07/16 0756  BP:  (!) 134/48 (!) 136/52   Pulse:   (!) 53   Resp:   15   Temp:  98.4 F (36.9 C) 98.1 F (36.7 C)   TempSrc:  Oral Oral   SpO2: 94%  95% 99%  Weight:      Height:        Intake/Output Summary (Last 24 hours) at 02/07/16 1026 Last data filed at 02/07/16 0954  Gross per 24 hour  Intake              120 ml  Output             1500 ml  Net            -1380 ml   Weight change:  Exam:   General:  Pt is alert, follows commands appropriately, not in acute distress  HEENT: No icterus, No thrush, No neck mass, [place]/AT  Cardiovascular: RRR, S1/S2, no rubs, no gallops  Respiratory: Diminished breath sounds bilateral. Bibasilar rales. No wheezing  Abdomen: Soft/+BS, non tender, non distended, no guarding  Extremities: No edema, No lymphangitis, No petechiae, No rashes, no synovitis   Data Reviewed: I have personally reviewed following labs and imaging studies Basic Metabolic Panel:  Recent Labs Lab 02/03/16 1252 02/05/16 0436 02/07/16 0302  NA 136 139 138  K 4.9 4.4 4.4  CL 90* 97* 92*  CO2 38* 35* 39*  GLUCOSE 239* 203* 162*  BUN 18 26* 25*  CREATININE 1.10* 0.95 1.05*  CALCIUM 9.2 8.7* 9.5  Liver Function Tests: No results for input(s): AST, ALT, ALKPHOS, BILITOT, PROT, ALBUMIN in the last 168 hours. No results for input(s): LIPASE, AMYLASE in the last 168 hours. No results for input(s): AMMONIA in the last 168 hours. Coagulation Profile: No results for input(s): INR, PROTIME in the last 168 hours. CBC:  Recent Labs Lab 02/03/16 1252 02/07/16 0302  WBC 6.9 9.1  NEUTROABS 6.2  --   HGB  10.8* 10.9*  HCT 36.2 35.8*  MCV 97.1 94.5  PLT 205 218   Cardiac Enzymes:  Recent Labs Lab 02/03/16 1252 02/03/16 2049  CKTOTAL  --  337*  TROPONINI 0.03*  --    BNP: Invalid input(s): POCBNP CBG:  Recent Labs Lab 02/06/16 0758 02/06/16 1229 02/06/16 1655 02/06/16 2157 02/07/16 0728  GLUCAP 186* 158* 241* 166* 213*   HbA1C:  Recent Labs  02/05/16 0436  HGBA1C 6.5*   Urine analysis:    Component Value Date/Time   COLORURINE YELLOW 02/03/2016 2059   APPEARANCEUR CLEAR 02/03/2016 2059   LABSPEC 1.019 02/03/2016 2059   PHURINE 6.5 02/03/2016 2059   GLUCOSEU 100 (A) 02/03/2016 2059   HGBUR NEGATIVE 02/03/2016 2059   BILIRUBINUR NEGATIVE 02/03/2016 2059   KETONESUR NEGATIVE 02/03/2016 2059   PROTEINUR NEGATIVE 02/03/2016 2059   UROBILINOGEN 0.2 02/07/2011 1953   NITRITE NEGATIVE 02/03/2016 2059   LEUKOCYTESUR NEGATIVE 02/03/2016 2059   Sepsis Labs: @LABRCNTIP (procalcitonin:4,lacticidven:4) ) Recent Results (from the past 240 hour(s))  Blood culture (routine x 2)     Status: None (Preliminary result)   Collection Time: 02/03/16  2:26 PM  Result Value Ref Range Status   Specimen Description BLOOD LEFT FOREARM  Final   Special Requests BOTTLES DRAWN AEROBIC AND ANAEROBIC 5CC  Final   Culture NO GROWTH 3 DAYS  Final   Report Status PENDING  Incomplete  Blood culture (routine x 2)     Status: None (Preliminary result)   Collection Time: 02/03/16  2:34 PM  Result Value Ref Range Status   Specimen Description BLOOD RIGHT HAND  Final   Special Requests BOTTLES DRAWN AEROBIC AND ANAEROBIC 5CC  Final   Culture NO GROWTH 2 DAYS  Final   Report Status PENDING  Incomplete  MRSA PCR Screening     Status: None   Collection Time: 02/03/16  8:32 PM  Result Value Ref Range Status   MRSA by PCR NEGATIVE NEGATIVE Final    Comment:        The GeneXpert MRSA Assay (FDA approved for NASAL specimens only), is one component of a comprehensive MRSA  colonization surveillance program. It is not intended to diagnose MRSA infection nor to guide or monitor treatment for MRSA infections.      Scheduled Meds: . azithromycin  500 mg Intravenous Q24H  . cefTRIAXone (ROCEPHIN)  IV  1 g Intravenous Q24H  . enoxaparin (LOVENOX) injection  40 mg Subcutaneous Q24H  . insulin aspart  0-15 Units Subcutaneous TID WC  . insulin aspart  0-5 Units Subcutaneous QHS  . ipratropium  0.5 mg Nebulization Q4H  . levalbuterol  0.63 mg Nebulization Q4H  . methylPREDNISolone (SOLU-MEDROL) injection  60 mg Intravenous Q12H   Continuous Infusions:   Procedures/Studies: Dg Chest Port 1 View  Result Date: 02/04/2016 CLINICAL DATA:  75 y/o  F; COPD with acute exacerbation. EXAM: PORTABLE CHEST 1 VIEW COMPARISON:  Chest radiograph dated 02/03/2016. FINDINGS: The patient is partially rotated. There is upper lobe emphysema and hyperinflated lungs consistent with history of COPD. Patchy opacities at the  lung bases probably represent or pneumonia. No pneumothorax or effusion. Cardiac silhouette is stable given differences in technique and rotation. No acute osseous abnormality is evident. IMPRESSION: Findings of COPD. Bibasilar opacities may represent atelectasis or pneumoniae. No pneumothorax or pleural effusion. Electronically Signed   By: Kristine Garbe M.D.   On: 02/04/2016 04:58   Dg Chest Portable 1 View  Result Date: 02/03/2016 CLINICAL DATA:  Shortness of breath for a week, history COPD, colon cancer EXAM: PORTABLE CHEST 1 VIEW COMPARISON:  Portable exam 1223 hours compared to 02/02/2016 FINDINGS: Upper normal heart size. Atherosclerotic calcification aorta. Mediastinal contours and pulmonary vascularity normal. Emphysematous changes with bibasilar infiltrates which could represent infection or edema, new since previous exam. Upper lungs clear. No pleural effusion or pneumothorax. Bones demineralized. IMPRESSION: COPD changes with new bibasilar  atelectasis versus infiltrate. Aortic atherosclerosis. Electronically Signed   By: Lavonia Dana M.D.   On: 02/03/2016 13:23    Meredith Fronczak, DO  [facility] Pager [phone]  If 7PM-7AM, please contact night-coverage [email or web address] Password [email or web address] 02/07/2016, 10:26 AM   LOS: 4 days

## 2016-02-07 NOTE — Progress Notes (Signed)
Patient is being transferred. Report given to receiving nurse. Family at bedside notified.  Basil Dess, RN

## 2016-02-07 NOTE — Progress Notes (Signed)
Pt refusing Bipap for tonight, stating she does not need it.

## 2016-02-08 LAB — GLUCOSE, CAPILLARY
GLUCOSE-CAPILLARY: 160 mg/dL — AB (ref 65–99)
GLUCOSE-CAPILLARY: 98 mg/dL (ref 65–99)
Glucose-Capillary: 220 mg/dL — ABNORMAL HIGH (ref 65–99)
Glucose-Capillary: 202 mg/dL — ABNORMAL HIGH (ref 65–99)

## 2016-02-08 LAB — CULTURE, BLOOD (ROUTINE X 2): Culture: NO GROWTH

## 2016-02-08 MED ORDER — PREDNISONE 50 MG PO TABS
60.0000 mg | ORAL_TABLET | Freq: Every day | ORAL | Status: DC
  Administered 2016-02-09: 60 mg via ORAL
  Filled 2016-02-08: qty 1

## 2016-02-08 MED ORDER — LEVALBUTEROL HCL 0.63 MG/3ML IN NEBU
0.6300 mg | INHALATION_SOLUTION | Freq: Four times a day (QID) | RESPIRATORY_TRACT | Status: DC
  Administered 2016-02-09: 0.63 mg via RESPIRATORY_TRACT
  Filled 2016-02-08: qty 3

## 2016-02-08 MED ORDER — IPRATROPIUM BROMIDE 0.02 % IN SOLN
0.5000 mg | Freq: Four times a day (QID) | RESPIRATORY_TRACT | Status: DC
  Administered 2016-02-09: 0.5 mg via RESPIRATORY_TRACT
  Filled 2016-02-08: qty 2.5

## 2016-02-08 NOTE — Evaluation (Signed)
Occupational Therapy Evaluation Patient Details Name: Meredith [name] MRN: [id number] DOB: 1941-05-10 Today's Date: 02/08/2016    History of Present Illness pt is a 75 y/o female with pmh significant for hypoxemic/hypercarbic respiratory failure, COPD/steroid dependent, obesity hypoventilation syndrome, anxiety, admitted to ED with respiratory distress   Clinical Impression   PTA, pt required assistance for bathing, dressing, and basic transfers from her daughter. Evaluation limited as pt became very upset and tearful when pt's daughter and sister began talking about pt going to rehab and how difficult things had become at home. Focused rest of session on positive short and long-term goal setting, identification of enjoyable hobbies/activities for pt to complete when experiencing anxious thoughts, and relaxation strategies. Pt's overall goal to become strong enough physically and mentally to return home with daily assistance from her daughter and/or PCA. Pt will benefit from continued acute OT to assist her in achieving those goals while also mazimizing her independence and decreasing caregiver burden. Recommend HHOT at discharge.    Follow Up Recommendations  Home health OT;Supervision/Assistance - 24 hour    Equipment Recommendations  Other (comment) (Adaptive equipment)    Recommendations for Other Services       Precautions / Restrictions Precautions Precautions: Fall Precaution Comments: watch O2 sats Restrictions Weight Bearing Restrictions: No      Mobility Bed Mobility Overal bed mobility: Needs Assistance Bed Mobility: Supine to Sit;Sit to Supine     Supine to sit: Min guard Sit to supine: Min guard   General bed mobility comments: Min guard assist for safety.  Transfers                 General transfer comment: Not attempted at this time. Pt became anxious and tearful with family discussion and focus of session changed to anxiety/emotion management and  positive goal setting.    Balance Overall balance assessment: Needs assistance Sitting-balance support: No upper extremity supported;Feet supported Sitting balance-Leahy Scale: Fair                                      ADL Overall ADL's : Needs assistance/impaired Eating/Feeding: Set up;Sitting   Grooming: Set up;Sitting                                 General ADL Comments: After completing ADLs sitting up in bed, pt's family began to voice concerns over pt's physical and mental wellbeing and reported they wanted her to go to SNF for rehab. Pt became very anxious and tearful and stated that she "could not go on like this anymore. I'm such a burden to everyone around me." Rest of session focused on anxiety management strategies, forming short and long-term goals, and identifiying enjoyable hobbies for pt to focus on during anxious times.      Vision Vision Assessment?: No apparent visual deficits   Perception     Praxis      Pertinent Vitals/Pain Pain Assessment: No/denies pain     Hand Dominance Right   Extremity/Trunk Assessment Upper Extremity Assessment Upper Extremity Assessment: Generalized weakness   Lower Extremity Assessment Lower Extremity Assessment: Defer to PT evaluation   Cervical / Trunk Assessment Cervical / Trunk Assessment: Kyphotic   Communication Communication Communication: No difficulties   Cognition Arousal/Alertness: Awake/alert Behavior During Therapy: Anxious (crying) Overall Cognitive Status: Within Functional Limits for tasks assessed  General Comments       Exercises       Shoulder Instructions      Home Living Family/patient expects to be discharged to:: Private residence Living Arrangements: Children;Other relatives (Daughter and son-in-law) Available Help at Discharge: Family;Available 24 hours/day;Personal care attendant Type of Home: House Home Access: Stairs to  enter [employer] of Steps: 2-3 Entrance Stairs-Rails: Right;Left Home Layout: One level     Bathroom Shower/Tub: Teacher, early years/pre: Standard     Home Equipment: Environmental consultant - 4 wheels;Shower seat;Cane - single point   Additional Comments: PCA 2 days per week for ADL's and assists with shower 1 time/week      Prior Functioning/Environment Level of Independence: Needs assistance  Gait / Transfers Assistance Needed: uses rollator in the home.  Usually goes room to room before rest. ADL's / Homemaking Assistance Needed: PCA assists with bathing, dressing, light cleaning; family also provides assistance with all ADLs and IADLs, however unclear how much physical assistance is required        OT Diagnosis: Generalized weakness   OT Problem List: Decreased strength;Decreased activity tolerance;Impaired balance (sitting and/or standing);Decreased safety awareness;Decreased knowledge of use of DME or AE;Cardiopulmonary status limiting activity;Obesity;Pain   OT Treatment/Interventions: Self-care/ADL training;Therapeutic exercise;Energy conservation;DME and/or AE instruction;Therapeutic activities;Patient/family education;Balance training    OT Goals(Current goals can be found in the care plan section) Acute Rehab OT Goals Patient Stated Goal: "I don't want to live like this anymore. I want to do all of the things that I used to do." OT Goal Formulation: With patient Time For Goal Achievement: 02/22/16 Potential to Achieve Goals: Good ADL Goals Pt Will Perform Grooming: with supervision;standing Pt Will Perform Upper Body Bathing: with supervision;sitting Pt Will Perform Lower Body Bathing: with supervision;with adaptive equipment;sit to/from stand Pt Will Transfer to Toilet: with supervision;ambulating;bedside commode (over toilet) Pt Will Perform Toileting - Clothing Manipulation and hygiene: with supervision;sit to/from stand Additional ADL Goal #1: Pt will  identify 2 hobbies/activities she enjoys that she can attempt to do when feelings of anxiety or worry start.  OT Frequency: Min 2X/week   Barriers to D/C: Decreased caregiver support  Pt's daughter works during the day. Son-in-law is home, but typically sleeping since he works 2nd shift.       Co-evaluation              End of Session Equipment Utilized During Treatment: Oxygen Nurse Communication: Mobility status  Activity Tolerance: Other (comment) (Session limited by pt bcoming teaerful and upset) Patient left: in bed;with call bell/phone within reach;with bed alarm set;with family/visitor present   Time: [id number] OT Time Calculation (min): 26 min Charges:  OT General Charges $OT Visit: 1 Procedure OT Evaluation $OT Eval Moderate Complexity: 1 Procedure OT Treatments $Therapeutic Activity: 8-22 mins G-Codes:    Redmond Baseman, OTR/L Pager: [phone] 02/08/2016, 3:38 PM

## 2016-02-08 NOTE — Progress Notes (Signed)
PROGRESS NOTE  Meredith [name] [id number] DOB: 05-Oct-1940 DOA: 02/03/2016 PCP: Romana Juniper, MD  Brief History:  75 year old female with a history of chronic respiratory failure on 4 L nasal cannula, obstructive sleep apnea, depression, COPD presented with one-week history of shortness of breath, coughing with yellow sputum, and wheezing. She went to see her primary care provider on 02/02/2016 where she was given a steroid injection and started on prednisone taper and antibiotics. She did not improve. The patient's family noted the patient was confused and restless on the morning of 02/03/2016 with oxygen saturation of 77%.[facility] Upon presentation, the patient the patient was hypoxemic with oxygen saturation 82%. The patient was placed on BiPAP and given albuterol, Solu-Medrol, and Rocephin with azithromycin.   Assessment/Plan: Acute on chronic respiratory failure with hypoxia and hypercapnia -Secondary to COPD exacerbation -Patient has been weaned off of BiPAP, but will continue when necessary increase work of breathing -Patient is on 4 L nasal cannula at baseline, 6 L with activity  -Pulmonary hygiene  -Wean for oxygenation 88-92 percent  -appreciate pulmonary consult  COPD exacerbation  -Finished 5 days ceftriaxone and azithromycin 02/07/16 -Continue Xopenex/Atrovent q 4 hours -Continue intravenous Solu-Medrol--wean to prednisone -pt refusing BiPAP at night  Lactic acidosis -Secondary to hypoxia and poor perfusion -Improved with fluid resuscitation -improved -saline lock IVF  Diabetes mellitus type 2 -02/05/16--Hemoglobin A1c--6.5 -Hold glipizide for now -NovoLog sliding scale  Depression/anxiety -Continue alprazolam when necessary   Disposition Plan: Home 8/21 if continues to improve Family Communication: Updated sister at bedside 8/20  Consultants: Pulmonary  Code Status: FULL  DVT Prophylaxis: [place]  Lovenox   Procedures: As Listed in Progress Note Above  Antibiotics: Ceftriaxone 8/15>>>8/19 Azithromycin 8/15>>>8/19        Subjective: Patient denies fevers, chills, headache, chest pain, dyspnea, nausea, vomiting, diarrhea, abdominal pain, dysuria, hematuria, hematochezia, and melena.  Still has some dyspnea with exertion, but overall improved   Objective: Vitals:   02/08/16 0343 02/08/16 0640 02/08/16 0902 02/08/16 1217  BP:  (!) 153/66    Pulse:  82    Resp:  18    Temp:  98.3 F (36.8 C)    TempSrc:  Oral    SpO2: 94% 94% 95% 94%  Weight:      Height:        Intake/Output Summary (Last 24 hours) at 02/08/16 1401 Last data filed at 02/08/16 0700  Gross per 24 hour  Intake                0 ml  Output             1800 ml  Net            -1800 ml   Weight change:  Exam:   General:  Pt is alert, follows commands appropriately, not in acute distress  HEENT: No icterus, No thrush, No neck mass, [place]/AT  Cardiovascular: RRR, S1/S2, no rubs, no gallops  Respiratory: diminished BS with basilar rales.  No wheezse  Abdomen: Soft/+BS, non tender, non distended, no guarding  Extremities: No edema, No lymphangitis, No petechiae, No rashes, no synovitis   Data Reviewed: I have personally reviewed following labs and imaging studies Basic Metabolic Panel:  Recent Labs Lab 02/03/16 1252 02/05/16 0436 02/07/16 0302  NA 136 139 138  K 4.9 4.4 4.4  CL 90* 97* 92*  CO2 38* 35* 39*  GLUCOSE 239* 203* 162*  BUN 18 26*  25*  CREATININE 1.10* 0.95 1.05*  CALCIUM 9.2 8.7* 9.5   Liver Function Tests: No results for input(s): AST, ALT, ALKPHOS, BILITOT, PROT, ALBUMIN in the last 168 hours. No results for input(s): LIPASE, AMYLASE in the last 168 hours. No results for input(s): AMMONIA in the last 168 hours. Coagulation Profile: No results for input(s): INR, PROTIME in the last 168 hours. CBC:  Recent Labs Lab 02/03/16 1252 02/07/16 0302  WBC 6.9 9.1   NEUTROABS 6.2  --   HGB 10.8* 10.9*  HCT 36.2 35.8*  MCV 97.1 94.5  PLT 205 218   Cardiac Enzymes:  Recent Labs Lab 02/03/16 1252 02/03/16 2049  CKTOTAL  --  337*  TROPONINI 0.03*  --    BNP: Invalid input(s): POCBNP CBG:  Recent Labs Lab 02/07/16 1506 02/07/16 1747 02/07/16 2154 02/08/16 0751 02/08/16 1145  GLUCAP 119* 95 209* 160* 98   HbA1C: No results for input(s): HGBA1C in the last 72 hours. Urine analysis:    Component Value Date/Time   COLORURINE YELLOW 02/03/2016 2059   APPEARANCEUR CLEAR 02/03/2016 2059   LABSPEC 1.019 02/03/2016 2059   PHURINE 6.5 02/03/2016 2059   GLUCOSEU 100 (A) 02/03/2016 2059   HGBUR NEGATIVE 02/03/2016 2059   BILIRUBINUR NEGATIVE 02/03/2016 2059   KETONESUR NEGATIVE 02/03/2016 2059   PROTEINUR NEGATIVE 02/03/2016 2059   UROBILINOGEN 0.2 02/07/2011 1953   NITRITE NEGATIVE 02/03/2016 2059   LEUKOCYTESUR NEGATIVE 02/03/2016 2059   Sepsis Labs: @LABRCNTIP (procalcitonin:4,lacticidven:4) ) Recent Results (from the past 240 hour(s))  Blood culture (routine x 2)     Status: None (Preliminary result)   Collection Time: 02/03/16  2:26 PM  Result Value Ref Range Status   Specimen Description BLOOD LEFT FOREARM  Final   Special Requests BOTTLES DRAWN AEROBIC AND ANAEROBIC 5CC  Final   Culture NO GROWTH 4 DAYS  Final   Report Status PENDING  Incomplete  Blood culture (routine x 2)     Status: None (Preliminary result)   Collection Time: 02/03/16  2:34 PM  Result Value Ref Range Status   Specimen Description BLOOD RIGHT HAND  Final   Special Requests BOTTLES DRAWN AEROBIC AND ANAEROBIC 5CC  Final   Culture NO GROWTH 3 DAYS  Final   Report Status PENDING  Incomplete  MRSA PCR Screening     Status: None   Collection Time: 02/03/16  8:32 PM  Result Value Ref Range Status   MRSA by PCR NEGATIVE NEGATIVE Final    Comment:        The GeneXpert MRSA Assay (FDA approved for NASAL specimens only), is one component of a comprehensive  MRSA colonization surveillance program. It is not intended to diagnose MRSA infection nor to guide or monitor treatment for MRSA infections.      Scheduled Meds: . cefTRIAXone (ROCEPHIN)  IV  1 g Intravenous Q24H  . enoxaparin (LOVENOX) injection  40 mg Subcutaneous Q24H  . insulin aspart  0-15 Units Subcutaneous TID WC  . insulin aspart  0-5 Units Subcutaneous QHS  . ipratropium  0.5 mg Nebulization Q4H  . levalbuterol  0.63 mg Nebulization Q4H  . methylPREDNISolone (SOLU-MEDROL) injection  60 mg Intravenous Q12H   Continuous Infusions:   Procedures/Studies: Dg Chest Port 1 View  Result Date: 02/04/2016 CLINICAL DATA:  75 y/o  F; COPD with acute exacerbation. EXAM: PORTABLE CHEST 1 VIEW COMPARISON:  Chest radiograph dated 02/03/2016. FINDINGS: The patient is partially rotated. There is upper lobe emphysema and hyperinflated lungs consistent with history of COPD.  Patchy opacities at the lung bases probably represent or pneumonia. No pneumothorax or effusion. Cardiac silhouette is stable given differences in technique and rotation. No acute osseous abnormality is evident. IMPRESSION: Findings of COPD. Bibasilar opacities may represent atelectasis or pneumoniae. No pneumothorax or pleural effusion. Electronically Signed   By: Kristine Garbe M.D.   On: 02/04/2016 04:58   Dg Chest Portable 1 View  Result Date: 02/03/2016 CLINICAL DATA:  Shortness of breath for a week, history COPD, colon cancer EXAM: PORTABLE CHEST 1 VIEW COMPARISON:  Portable exam 1223 hours compared to 02/02/2016 FINDINGS: Upper normal heart size. Atherosclerotic calcification aorta. Mediastinal contours and pulmonary vascularity normal. Emphysematous changes with bibasilar infiltrates which could represent infection or edema, new since previous exam. Upper lungs clear. No pleural effusion or pneumothorax. Bones demineralized. IMPRESSION: COPD changes with new bibasilar atelectasis versus infiltrate. Aortic  atherosclerosis. Electronically Signed   By: Lavonia Dana M.D.   On: 02/03/2016 13:23    Lorijean Husser, DO  [facility] Pager [phone]  If 7PM-7AM, please contact night-coverage [email or web address] Password [email or web address] 02/08/2016, 2:01 PM   LOS: 5 days

## 2016-02-08 NOTE — Evaluation (Signed)
Physical Therapy Evaluation Patient Details Name: Meredith [name] MRN: [id number] DOB: [birth year] Today's Date: 02/08/2016   History of Present Illness  pt is a 75 y/o female with pmh significant for hypoxemic/hypercarbic respiratory failure, COPD/steroid dependent, obesity hypoventilation syndrome, anxiety, admitted to ED with respiratory distress  Clinical Impression  Pt admitted with/for respiratory failure.  Pt currently limited functionally due to the problems listed below.  (see problems list.)  Pt will benefit from PT to maximize function and safety to be able to get home safely with available assist of family.     Follow Up Recommendations No PT follow up    Equipment Recommendations  None recommended by PT    Recommendations for Other Services       Precautions / Restrictions Precautions Precautions: Fall Precaution Comments: watch O2 sats Restrictions Weight Bearing Restrictions: No      Mobility  Bed Mobility Overal bed mobility: Needs Assistance Bed Mobility: Sit to Supine       Sit to supine: Supervision   General bed mobility comments: no assist to get in flat bed, but needed help getting scooted up  Transfers Overall transfer level: Needs assistance   Transfers: Sit to/from Stand Sit to Stand: Min guard         General transfer comment: repetitive cuing for safe hand placement  Ambulation/Gait Ambulation/Gait assistance: Min guard;Min assist Ambulation Distance (Feet): 12 Feet (then 28 feet with RW and 4 L O2) Assistive device: Rolling walker (2 wheeled) Gait Pattern/deviations: Step-through pattern   Gait velocity interpretation: Below normal speed for age/gender General Gait Details: mildly unsteady at times mostly due to being anxiety ridden and rushing to get the walking task over with  Stairs            Wheelchair Mobility    Modified Rankin (Stroke Patients Only)       Balance Overall balance assessment: Needs  assistance Sitting-balance support: No upper extremity supported;Bilateral upper extremity supported Sitting balance-Leahy Scale: Fair     Standing balance support: No upper extremity supported;Bilateral upper extremity supported Standing balance-Leahy Scale: Fair                               Pertinent Vitals/Pain Pain Assessment: Faces Faces Pain Scale: No hurt    Home Living Family/patient expects to be discharged to:: Private residence Living Arrangements: Children;Other relatives Available Help at Discharge: Family;Available 24 hours/day;Personal care attendant Type of Home: House Home Access: Stairs to enter Entrance Stairs-Rails: Psychiatric nurse of Steps: 2-3 Home Layout: One level Home Equipment: Walker - 4 wheels;Shower seat;Cane - single point Additional Comments: PCA 2 days per week for ADL's    Prior Function Level of Independence: Independent with assistive device(s);Needs assistance   Gait / Transfers Assistance Needed: uses rollator in the home.  Usually goes room to room before rest.  ADL's / Homemaking Assistance Needed: PCA and family assist        Hand Dominance        Extremity/Trunk Assessment   Upper Extremity Assessment: Defer to OT evaluation           Lower Extremity Assessment: Overall WFL for tasks assessed;Generalized weakness         Communication   Communication: No difficulties  Cognition Arousal/Alertness: Awake/alert Behavior During Therapy: Anxious Overall Cognitive Status: Within Functional Limits for tasks assessed  General Comments General comments (skin integrity, edema, etc.): Sats dropped to 77% on 4L during gait, likely due to anxiety:  recovered in 1 minute..  Sats maintained in lower 90's when helped to attain and more relaxed state.    Exercises        Assessment/Plan    PT Assessment Patient needs continued PT services  PT Diagnosis Other  (comment);Generalized weakness (decr activity tolerance)   PT Problem List Decreased strength;Decreased activity tolerance;Decreased mobility;Decreased knowledge of use of DME;Cardiopulmonary status limiting activity  PT Treatment Interventions DME instruction;Gait training;Stair training;Functional mobility training;Therapeutic activities;Patient/family education   PT Goals (Current goals can be found in the Care Plan section) Acute Rehab PT Goals Patient Stated Goal: I need to feel calm and be able to sleep PT Goal Formulation: With patient Time For Goal Achievement: 02/22/16 Potential to Achieve Goals: Good    Frequency Min 3X/week   Barriers to discharge        Co-evaluation               End of Session Equipment Utilized During Treatment: Oxygen Activity Tolerance: Other (comment) (limited by anxiety) Patient left: in bed;with call bell/phone within reach;with family/visitor present Nurse Communication: Mobility status         Time: 1000-1040 PT Time Calculation (min) (ACUTE ONLY): 40 min   Charges:   PT Evaluation $PT Eval Moderate Complexity: 1 Procedure PT Treatments $Gait Training: 8-22 mins   PT G Codes:        Shatima Zalar, Tessie Fass 02/08/2016, 11:03 AM 02/08/2016  Donnella Sham, PT [phone]  (pager)

## 2016-02-08 NOTE — Discharge Summary (Signed)
Physician Discharge Summary  Meredith [name] [id number] DOB: 09-04-1940 DOA: 02/03/2016  PCP: Romana Juniper, MD  Admit date: 02/03/2016 Discharge date: 02/09/2016  Admitted From: home Disposition:  Home  Recommendations for Outpatient Follow-up:  1. Follow up with PCP in 1-2 weeks 2. Please obtain BMP/CBC in one week   Home Health: YES Equipment/Devices:  HHOT  Discharge Condition: stable CODE STATUS: DNR Diet recommendation: Heart Healthy   Brief/Interim Summary: 75 year old female with a history of chronic respiratory failure on 4 L nasal cannula, obstructive sleep apnea, depression, COPD presented with one-week history of shortness of breath, coughing with yellow sputum, and wheezing. She went to see her primary care provider on 02/02/2016 where she was given a steroid injection and started on prednisone taper and antibiotics. She did not improve. The patient's family noted the patient was confused and restless on the morning of 02/03/2016 with oxygen saturation of 77%.[facility] Upon presentation, the patient the patient was hypoxemic with oxygen saturation 82%. The patient was placed on BiPAP and given albuterol, Solu-Medrol, and Rocephin with azithromycin.  Discharge Diagnoses:  Acute on chronic respiratory failure with hypoxia and hypercapnia -Secondary to COPD exacerbation -Patient has been weaned off of BiPAP, but will continue when necessary increase work of breathing -Patient is on 4 L nasal cannula at baseline, 6 L with activity  -Pulmonary hygiene  -Wean for oxygenation 88-92 percent  -appreciate pulmonary consult -stable on 4 L [place] at time of d/c  COPD exacerbation  -Finished 5 days ceftriaxone and azithromycin 02/07/16 -Continue Xopenex/Atrovent q 4 hours -Continue intravenous Solu-Medrol--wean to prednisone -pt refusing BiPAP at night past 2 nights -prednisone taper after d/c--over 2 weeks -home with symbicort and albuterol per pulm -she has  followup appointment with Dr. Annamaria Boots in 1 week  Lactic acidosis -Secondary to hypoxia and poor perfusion -Improved with fluid resuscitation -improved -saline lock IVF  Diabetes mellitus type 2 -02/05/16--Hemoglobin A1c--6.5 -Hold glipizide during hospitalization-->resume after d/c -NovoLog sliding scale  Depression/anxiety -Continue alprazolam when necessary -needs PCP follow up -she may need long term medication   Discharge Instructions  Discharge Instructions    Diet - low sodium heart healthy    Complete by:  As directed   Increase activity slowly    Complete by:  As directed       Medication List    STOP taking these medications   budesonide 0.25 MG/2ML nebulizer solution Commonly known as:  PULMICORT   KLOR-CON M20 20 MEQ tablet Generic drug:  potassium chloride SA   levofloxacin 750 MG tablet Commonly known as:  LEVAQUIN   naproxen sodium 220 MG tablet Commonly known as:  ANAPROX     TAKE these medications   albuterol (2.5 MG/3ML) 0.083% nebulizer solution Commonly known as:  PROVENTIL Use 1 vial in nebulizer every 2-4 hours for  Shortness of breath and wheezing   albuterol 108 (90 Base) MCG/ACT inhaler Commonly known as:  PROVENTIL HFA;VENTOLIN HFA Inhale 2 puffs into the lungs every 6 (six) hours as needed for wheezing or shortness of breath.   ALPRAZolam 1 MG tablet Commonly known as:  XANAX Take 1 mg by mouth 3 (three) times daily as needed for anxiety or sleep. Take 1/2 to 1 tablet by mouth three times daily as needed   CENTRUM SILVER ADULT 50+ Tabs Take 1 tablet by mouth daily.   glipiZIDE 2.5 MG 24 hr tablet Commonly known as:  GLUCOTROL XL Take 2.5 mg by mouth daily.   guaiFENesin 600 MG 12 hr tablet  Commonly known as:  MUCINEX Take 1,200 mg by mouth daily.   ipratropium 0.02 % nebulizer solution Commonly known as:  ATROVENT Use in nebulizer four times daily as needed. What changed:  how to take this  additional instructions     ipratropium 0.06 % nasal spray Commonly known as:  ATROVENT USE 1-2 SPRAYS IN EACH NOSTRIL 4 TIMES DAILY FOR RUNNY NOSE What changed:  See the new instructions.   nystatin 100000 UNIT/ML suspension Commonly known as:  MYCOSTATIN TAKE 5MLS BY MOUTH 4 TIMES A DAY What changed:  how much to take  how to take this  when to take this  reasons to take this  additional instructions   omeprazole 40 MG capsule Commonly known as:  PRILOSEC Take 1 capsule (40 mg total) by mouth daily.   predniSONE 10 MG tablet Commonly known as:  DELTASONE Take 6 tablets (60 mg total) by mouth daily with breakfast. Decrease by one tablet every 2 days What changed:  medication strength  how much to take  when to take this  additional instructions   SYMBICORT 160-4.5 MCG/ACT inhaler Generic drug:  budesonide-formoterol USE 2 PUFFS TWICE DAILY   triamcinolone ointment 0.1 % Commonly known as:  KENALOG Apply 1 application topically daily as needed (for itching).   vitamin C 500 MG tablet Commonly known as:  ASCORBIC ACID Take 1,000 mg by mouth daily.      Follow-up Information    Deneise Lever, MD .   Specialty:  Pulmonary Disease Why:  Our Office will call You Contact information: [place] [address] [phone]        [place] .   Why:  Resumption of home health orders inplace ( RN,SW, Nurse Aide) Contact information: [address] [place] [address] [phone]          Allergies  Allergen Reactions  . Morphine     Made high strung and have hallucination   . Tramadol Other (See Comments)    Makes her mean  . Zoloft [Sertraline Hcl] Other (See Comments)    Made her cry a lot    Consultations:  pulmonology   Procedures/Studies: Dg Chest Port 1 View  Result Date: 02/04/2016 CLINICAL DATA:  75 y/o  F; COPD with acute exacerbation. EXAM: PORTABLE CHEST 1 VIEW COMPARISON:  Chest radiograph dated 02/03/2016.  FINDINGS: The patient is partially rotated. There is upper lobe emphysema and hyperinflated lungs consistent with history of COPD. Patchy opacities at the lung bases probably represent or pneumonia. No pneumothorax or effusion. Cardiac silhouette is stable given differences in technique and rotation. No acute osseous abnormality is evident. IMPRESSION: Findings of COPD. Bibasilar opacities may represent atelectasis or pneumoniae. No pneumothorax or pleural effusion. Electronically Signed   By: Kristine Garbe M.D.   On: 02/04/2016 04:58   Dg Chest Portable 1 View  Result Date: 02/03/2016 CLINICAL DATA:  Shortness of breath for a week, history COPD, colon cancer EXAM: PORTABLE CHEST 1 VIEW COMPARISON:  Portable exam 1223 hours compared to 02/02/2016 FINDINGS: Upper normal heart size. Atherosclerotic calcification aorta. Mediastinal contours and pulmonary vascularity normal. Emphysematous changes with bibasilar infiltrates which could represent infection or edema, new since previous exam. Upper lungs clear. No pleural effusion or pneumothorax. Bones demineralized. IMPRESSION: COPD changes with new bibasilar atelectasis versus infiltrate. Aortic atherosclerosis. Electronically Signed   By: Lavonia Dana M.D.   On: 02/03/2016 13:23        Discharge Exam: Vitals:  02/09/16 0533 02/09/16 1128  BP: 139/63   Pulse: 80 85  Resp: 17 18  Temp: 97.5 F (36.4 C)    Vitals:   02/08/16 2155 02/09/16 0229 02/09/16 0533 02/09/16 1128  BP: 135/60  139/63   Pulse: 92  80 85  Resp: 17  17 18   Temp: 98 F (36.7 C)  97.5 F (36.4 C)   TempSrc: Oral  Oral   SpO2: 99% 99% 93%   Weight:      Height:        General: Pt is alert, awake, not in acute distress Cardiovascular: RRR, S1/S2 +, no rubs, no gallops Respiratory: CTA bilaterally, no wheezing, no rhonchi Abdominal: Soft, NT, ND, bowel sounds + Extremities: no edema, no cyanosis   The results of significant diagnostics from this  hospitalization (including imaging, microbiology, ancillary and laboratory) are listed below for reference.    Significant Diagnostic Studies: Dg Chest Port 1 View  Result Date: 02/04/2016 CLINICAL DATA:  75 y/o  F; COPD with acute exacerbation. EXAM: PORTABLE CHEST 1 VIEW COMPARISON:  Chest radiograph dated 02/03/2016. FINDINGS: The patient is partially rotated. There is upper lobe emphysema and hyperinflated lungs consistent with history of COPD. Patchy opacities at the lung bases probably represent or pneumonia. No pneumothorax or effusion. Cardiac silhouette is stable given differences in technique and rotation. No acute osseous abnormality is evident. IMPRESSION: Findings of COPD. Bibasilar opacities may represent atelectasis or pneumoniae. No pneumothorax or pleural effusion. Electronically Signed   By: Kristine Garbe M.D.   On: 02/04/2016 04:58   Dg Chest Portable 1 View  Result Date: 02/03/2016 CLINICAL DATA:  Shortness of breath for a week, history COPD, colon cancer EXAM: PORTABLE CHEST 1 VIEW COMPARISON:  Portable exam 1223 hours compared to 02/02/2016 FINDINGS: Upper normal heart size. Atherosclerotic calcification aorta. Mediastinal contours and pulmonary vascularity normal. Emphysematous changes with bibasilar infiltrates which could represent infection or edema, new since previous exam. Upper lungs clear. No pleural effusion or pneumothorax. Bones demineralized. IMPRESSION: COPD changes with new bibasilar atelectasis versus infiltrate. Aortic atherosclerosis. Electronically Signed   By: Lavonia Dana M.D.   On: 02/03/2016 13:23     Microbiology: Recent Results (from the past 240 hour(s))  Blood culture (routine x 2)     Status: None   Collection Time: 02/03/16  2:26 PM  Result Value Ref Range Status   Specimen Description BLOOD LEFT FOREARM  Final   Special Requests BOTTLES DRAWN AEROBIC AND ANAEROBIC 5CC  Final   Culture NO GROWTH 5 DAYS  Final   Report Status 02/08/2016  FINAL  Final  Blood culture (routine x 2)     Status: None (Preliminary result)   Collection Time: 02/03/16  2:34 PM  Result Value Ref Range Status   Specimen Description BLOOD RIGHT HAND  Final   Special Requests BOTTLES DRAWN AEROBIC AND ANAEROBIC 5CC  Final   Culture NO GROWTH 4 DAYS  Final   Report Status PENDING  Incomplete  MRSA PCR Screening     Status: None   Collection Time: 02/03/16  8:32 PM  Result Value Ref Range Status   MRSA by PCR NEGATIVE NEGATIVE Final    Comment:        The GeneXpert MRSA Assay (FDA approved for NASAL specimens only), is one component of a comprehensive MRSA colonization surveillance program. It is not intended to diagnose MRSA infection nor to guide or monitor treatment for MRSA infections.      Labs: Basic Metabolic Panel:  Recent Labs Lab 02/03/16 1252 02/05/16 0436 02/07/16 0302  NA 136 139 138  K 4.9 4.4 4.4  CL 90* 97* 92*  CO2 38* 35* 39*  GLUCOSE 239* 203* 162*  BUN 18 26* 25*  CREATININE 1.10* 0.95 1.05*  CALCIUM 9.2 8.7* 9.5   Liver Function Tests: No results for input(s): AST, ALT, ALKPHOS, BILITOT, PROT, ALBUMIN in the last 168 hours. No results for input(s): LIPASE, AMYLASE in the last 168 hours. No results for input(s): AMMONIA in the last 168 hours. CBC:  Recent Labs Lab 02/03/16 1252 02/07/16 0302  WBC 6.9 9.1  NEUTROABS 6.2  --   HGB 10.8* 10.9*  HCT 36.2 35.8*  MCV 97.1 94.5  PLT 205 218   Cardiac Enzymes:  Recent Labs Lab 02/03/16 1252 02/03/16 2049  CKTOTAL  --  337*  TROPONINI 0.03*  --    BNP: Invalid input(s): POCBNP CBG:  Recent Labs Lab 02/08/16 0751 02/08/16 1145 02/08/16 1754 02/08/16 2154 02/09/16 0829  GLUCAP 160* 98 220* 202* 114*    Time coordinating discharge:  Greater than 30 minutes  Signed:  Julann Mcgilvray, DO [facility] Pager: [phone] 02/09/2016, 12:17 PM

## 2016-02-09 ENCOUNTER — Telehealth: Payer: Self-pay | Admitting: Internal Medicine

## 2016-02-09 LAB — CULTURE, BLOOD (ROUTINE X 2): CULTURE: NO GROWTH

## 2016-02-09 LAB — PROCALCITONIN

## 2016-02-09 LAB — GLUCOSE, CAPILLARY
GLUCOSE-CAPILLARY: 114 mg/dL — AB (ref 65–99)
Glucose-Capillary: 176 mg/dL — ABNORMAL HIGH (ref 65–99)

## 2016-02-09 MED ORDER — PANTOPRAZOLE SODIUM 40 MG PO TBEC: 40.0000 mg | DELAYED_RELEASE_TABLET | Freq: Every day | ORAL | Status: DC

## 2016-02-09 MED ORDER — OMEPRAZOLE 40 MG PO CPDR: 40.0000 mg | DELAYED_RELEASE_CAPSULE | Freq: Every day | ORAL | 0 refills | Status: AC

## 2016-02-09 MED ORDER — PREDNISONE 10 MG PO TABS: 60.0000 mg | ORAL_TABLET | Freq: Every day | ORAL | 0 refills | Status: DC

## 2016-02-09 NOTE — Progress Notes (Signed)
Physical Therapy Treatment Patient Details Name: Meredith [name] MRN: [id number] DOB: 26-Aug-1940 Today's Date: 02/09/2016    History of Present Illness pt is a 75 y/o female with pmh significant for hypoxemic/hypercarbic respiratory failure, COPD/steroid dependent, obesity hypoventilation syndrome, anxiety, admitted to ED with respiratory distress    PT Comments    Much better participation today.  No nearly as anxious and able to ambulate and maintain sats at 85 % on 6 L whereas she dropped into the 70's last treatment session. She recovered much more quickly today also.   Follow Up Recommendations  Home health PT;Supervision/Assistance - 24 hour;Other (comment)     Equipment Recommendations       Recommendations for Other Services       Precautions / Restrictions Precautions Precautions: Fall Precaution Comments: watch O2 sats    Mobility  Bed Mobility               General bed mobility comments: up in the chair  Transfers Overall transfer level: Needs assistance Equipment used: Rolling walker (2 wheeled) Transfers: Sit to/from Stand Sit to Stand: Min guard         General transfer comment: cues for transfer safety, tends to haul herself up using the RW  Ambulation/Gait Ambulation/Gait assistance: Min guard Ambulation Distance (Feet): 25 Feet (then additional 15 feet with 6L O2) Assistive device: Rolling walker (2 wheeled) Gait Pattern/deviations: Step-through pattern Gait velocity: moderate Gait velocity interpretation: Below normal speed for age/gender General Gait Details: mildly unsteady at times.  Cues needed to slow pt down to a speed she can control   Stairs            Wheelchair Mobility    Modified Rankin (Stroke Patients Only)       Balance     Sitting balance-Leahy Scale: Fair       Standing balance-Leahy Scale: Fair                      Cognition Arousal/Alertness: Awake/alert Behavior During Therapy:  Anxious Overall Cognitive Status: Within Functional Limits for tasks assessed                      Exercises      General Comments General comments (skin integrity, edema, etc.): SpO2 on 6L during gait at 85% and 93 bpm with quick return to 93%.  Much less dyspnea and more manageable anxiety today.      Pertinent Vitals/Pain Pain Assessment: No/denies pain    Home Living                      Prior Function            PT Goals (current goals can now be found in the care plan section) Acute Rehab PT Goals Patient Stated Goal: I feel much better.Marland KitchenMarland KitchenI got some sleep PT Goal Formulation: With patient Time For Goal Achievement: 02/22/16 Potential to Achieve Goals: Good Progress towards PT goals: Progressing toward goals    Frequency  Min 3X/week    PT Plan Current plan remains appropriate    Co-evaluation             End of Session Equipment Utilized During Treatment: Oxygen Activity Tolerance: Patient tolerated treatment well;Patient limited by fatigue Patient left: in chair;with call bell/phone within reach;with chair alarm set;with family/visitor present     Time: 1037-1100 PT Time Calculation (min) (ACUTE ONLY): 23 min  Charges:  $Gait Training: 8-22  mins $Therapeutic Activity: 8-22 mins                    G Codes:      Brantly Kalman, Tessie Fass 02/09/2016, 12:47 PM 02/09/2016  Donnella Sham, PT [phone]  (pager)

## 2016-02-09 NOTE — Telephone Encounter (Signed)
LMOMTCB for [employer]

## 2016-02-09 NOTE — Telephone Encounter (Signed)
Pt is needing a 1 week HFU. Please advise Dr. Annamaria Boots thanks

## 2016-02-09 NOTE — Progress Notes (Addendum)
Name: Meredith [name] MRN: [id number] DOB: 1940-10-25    ADMISSION DATE:  02/03/2016 CONSULTATION DATE:  02/05/2016  REFERRING MD :  Orson Eva, M.D.   CHIEF COMPLAINT:  COPD Exacerbation  SIGNIFICANT EVENTS  8/15 - Admit  STUDIES:  PFT (9/24/7):  FVC 1.92 L (60%) FEV1 0.75 L (32%) FEV1/FVC 0.39 Positive Bronchodilator Response TLC 4.87 L (92%) RV 145%  CXR PA/LAT 8/14:  Hyperinflation w/ mild bibasilar opacities. No pleural effusion. Heart normal in size. Normal mediastinal contour. Port CXR 8/16:  Slight worsening in bilateral basilar opacities with hyperinflation.  MICROBIOLOGY: MRSA PCR 8/15:  Negative HIV 8/15:  Negative Blood Ctx x2 8/15 >> Urine Strep Ag 8/15:  Negative  Urine Legionella Ag 8/15 >>  ANTIBIOTICS: Azithromycin 8/15 >> completed  Rocephin 8/15 >> completed    SUBJECTIVE:  Better wants to go home   VITAL SIGNS: Temp:  [97.5 F (36.4 C)-98 F (36.7 C)] 97.5 F (36.4 C) (08/21 0533) Pulse Rate:  [80-92] 80 (08/21 0533) Resp:  [17] 17 (08/21 0533) BP: (135-139)/(60-63) 139/63 (08/21 0533) SpO2:  [93 %-99 %] 93 % (08/21 0533) 4 liters  PHYSICAL EXAMINATION: General:  Awake. Alert Family at bedside. Calm, wants to go home Integument:  Warm & dry. No rash on exposed skin.  HEENT:  Moist mucus membranes.Cayce in place. Cardiovascular:  Regular rate. No edema. No appreciable JVD.  Pulmonary:  Diminished breath sounds in bilateral lung bases. Abdomen: Soft. Normal bowel sounds. Protuberant. Grossly nontender. Musculoskeletal:  Normal bulk and tone. No joint deformity or effusion appreciated. Neurological: intact  Psychiatric:  Mood and affect congruent. Oriented to person, year, president, place, and situation.   Recent Labs Lab 02/03/16 1252 02/05/16 0436 02/07/16 0302  NA 136 139 138  K 4.9 4.4 4.4  CL 90* 97* 92*  CO2 38* 35* 39*  BUN 18 26* 25*  CREATININE 1.10* 0.95 1.05*  GLUCOSE 239* 203* 162*    Recent Labs Lab 02/03/16 1252  02/07/16 0302  HGB 10.8* 10.9*  HCT 36.2 35.8*  WBC 6.9 9.1  PLT 205 218   No results found.  ASSESSMENT / PLAN  75 year old female with history of COPD following in the pulmonary clinic.  Presented with a COPD exacerbation.  On chronic O2 and steroids therapy.  Chronically hypoxemic.    Acute on chronic hypoxemic respiratory failure:  - home on 4 liters   - BIPAP AT HS EVERY NIGHT   COPD exacerbation and acute bronchitis: completed abx  - taper to home dosing over ~2 weeks back to 5mg /d  - home on Symbicort BID & PRN albuterol  - add protonix daily   -post-hospital f/u on   -all above has been d/w family   Code status:  - DNR per patient and family.   Erick Colace ACNP-BC [facility] Pulmonary/Critical Care Pager # [phone] OR # [phone] if no answer  Attending Note:  75 year old female with severe COPD history presenting with acute on chronic respiratory failure.  On exam, BS are diminished diffusely.  I reviewed CXR myself, hyperinflation noted.  Discussed with PCCM-NP.  Acute on chronic respiratory failure:  - BiPAP at night.  - O2 for sat of 88-92%.  COPD exacerbation:  - Taper steroids back to 5 mg daily over the next 2 wks.  - Continue symbicort BID and PRN albuterol.  - Agree with protonix.  - F/U in clinic.  Code status:  - DNR.  PCCM will sign off, please call  back if needed.  Patient seen and examined, agree with above note.  I dictated the care and orders written for this patient under my direction.  Rush Farmer, MD [phone]

## 2016-02-09 NOTE — Discharge Instructions (Signed)
OXYGEN: 4 liters BIPAP: at night every night  Resume symbicort Resume albuterol as needed Wean prednisone over 2 weeks back to 5mg /day  Our office will call you with appointment

## 2016-02-09 NOTE — Telephone Encounter (Signed)
Please see if an NP might be able to see her next week or so.

## 2016-02-10 NOTE — Telephone Encounter (Signed)
Attempted to call the pts daughter to set up appt next week.  [email or web address] for lisa.

## 2016-02-11 NOTE — Telephone Encounter (Signed)
Called and spoke with pts daughter lisa---she is aware of appt scheduled with TP on 8/29 at 1130.

## 2016-02-17 ENCOUNTER — Telehealth: Payer: Self-pay | Admitting: Internal Medicine

## 2016-02-17 ENCOUNTER — Encounter: Payer: Self-pay | Admitting: Adult Health

## 2016-02-17 ENCOUNTER — Ambulatory Visit (INDEPENDENT_AMBULATORY_CARE_PROVIDER_SITE_OTHER): Payer: Medicare HMO | Admitting: Adult Health

## 2016-02-17 DIAGNOSIS — J449 Chronic obstructive pulmonary disease, unspecified: Secondary | ICD-10-CM | POA: Diagnosis not present

## 2016-02-17 DIAGNOSIS — Z23 Encounter for immunization: Secondary | ICD-10-CM | POA: Diagnosis not present

## 2016-02-17 DIAGNOSIS — J9611 Chronic respiratory failure with hypoxia: Secondary | ICD-10-CM | POA: Diagnosis not present

## 2016-02-17 NOTE — Patient Instructions (Addendum)
Flu shot today .  Continue on BIPAP At bedtime   Continue on Symbicort 2 puffs twice daily. Continue on albuterol and Atrovent nebulizer 4 times a day Follow-up with Dr. Annamaria Boots in 2 months and as needed Taper prednisone as directed to 10 mg daily and hold at this dose Please contact office for sooner follow up if symptoms do not improve or worsen or seek emergency care

## 2016-02-17 NOTE — Progress Notes (Signed)
Subjective:    Patient ID: Meredith [name], female    DOB: [birth year], 75 y.o.   MRN: [id number]  HPI 75 yo female former smoker with severe COPD on o2 at 3-4 l/m on BIPAP At bedtime  .  On chronic steroids for year with prednisone 5mg  daily .   TEST  03/14/2006 showed an FEV1 at 32%, ratio 39, FVC 60%, positive bronchodilator response  02/17/2016 Post hospital husband for follow-up. Patient presents for a post hospital follow-up. Neck 5. Patient was admitted August 15 through 02/09/2016 for a COPD exacerbation with acute on chronic respiratory failure with hypoxia and hypercarbia. Patient was treated with initial BiPAP support. She was treated with antibiotics, steroids, and nebs. She remains on Symbicort , Atrovent  nebulizer 4 times daily. Pneumovax and Prevnar vaccines are up-to-date. Discussed  flu shot today, would like to get today.  On prednisone taper . Typically takes 5-10mg  daily .  She is feeling better with less cough and dyspnea.  She denies chest pain, hemoptysis, orthopnea, PND, or increased leg swelling  Past Medical History:  Diagnosis Date  . Anxiety   . Chronic respiratory failure with hypoxia and hypercapnia (HCC)    Nocturnal BiPAP / 4 L/m at rest & 5 L/m with exertion  . Colon cancer ([place])    colon cancer  . COPD, very severe ([place])   . DM type 2 (diabetes mellitus, type 2) (HCC)    steroid induced  . Insomnia    Current Outpatient Prescriptions on File Prior to Visit  Medication Sig Dispense Refill  . albuterol (PROVENTIL HFA;VENTOLIN HFA) 108 (90 Base) MCG/ACT inhaler Inhale 2 puffs into the lungs every 6 (six) hours as needed for wheezing or shortness of breath. 1 Inhaler 5  . albuterol (PROVENTIL) (2.5 MG/3ML) 0.083% nebulizer solution Use 1 vial in nebulizer every 2-4 hours for  Shortness of breath and wheezing 90 vial 1  . ALPRAZolam (XANAX) 1 MG tablet Take 1 mg by mouth 3 (three) times daily as needed for anxiety or sleep. Take 1/2 to 1 tablet by  mouth three times daily as needed    . glipiZIDE (GLUCOTROL XL) 2.5 MG 24 hr tablet Take 2.5 mg by mouth daily.      Marland Kitchen guaiFENesin (MUCINEX) 600 MG 12 hr tablet Take 1,200 mg by mouth daily.     Marland Kitchen ipratropium (ATROVENT) 0.02 % nebulizer solution Use in nebulizer four times daily as needed. (Patient taking differently: Take by nebulization. Use in nebulizer four times daily as needed.) 300 mL 5  . ipratropium (ATROVENT) 0.06 % nasal spray USE 1-2 SPRAYS IN EACH NOSTRIL 4 TIMES DAILY FOR RUNNY NOSE (Patient taking differently: USE 1-2 SPRAYS IN EACH NOSTRIL twice daily as needed FOR RUNNY NOSE) 15 mL 5  . Multiple Vitamins-Minerals (CENTRUM SILVER ADULT 50+) TABS Take 1 tablet by mouth daily.    Marland Kitchen nystatin (MYCOSTATIN) 100000 UNIT/ML suspension TAKE 5MLS BY MOUTH 4 TIMES A DAY (Patient taking differently: Use as directed 5 mLs in the mouth or throat 2 (two) times daily as needed (for thrush). ) 360 mL 0  . omeprazole (PRILOSEC) 40 MG capsule Take 1 capsule (40 mg total) by mouth daily. 30 capsule 0  . predniSONE (DELTASONE) 10 MG tablet Take 6 tablets (60 mg total) by mouth daily with breakfast. Decrease by one tablet every 2 days 42 tablet 0  . triamcinolone ointment (KENALOG) 0.1 % Apply 1 application topically daily as needed (for itching).     Marland Kitchen  vitamin C (ASCORBIC ACID) 500 MG tablet Take 1,000 mg by mouth daily.     . SYMBICORT 160-4.5 MCG/ACT inhaler USE 2 PUFFS TWICE DAILY  1   No current facility-administered medications on file prior to visit.     Review of Systems Constitutional:   No  weight loss, night sweats,  Fevers, chills, + fatigue, or  lassitude.  HEENT:   No headaches,  Difficulty swallowing,  Tooth/dental problems, or  Sore throat,                No sneezing, itching, ear ache, nasal congestion, post nasal drip,   CV:  No chest pain,  Orthopnea, PND, swelling in lower extremities, anasarca, dizziness, palpitations, syncope.   GI  No heartburn, indigestion, abdominal pain,  nausea, vomiting, diarrhea, change in bowel habits, loss of appetite, bloody stools.   Resp: +shortness of breath with exertion .  No excess mucus, no productive cough,  No non-productive cough,  No coughing up of blood.  No change in color of mucus.  No wheezing.  No chest wall deformity  Skin: no rash or lesions.  GU: no dysuria, change in color of urine, no urgency or frequency.  No flank pain, no hematuria   MS:  No joint pain or swelling.  No decreased range of motion.  No back pain.  Psych:  No change in mood or affect. No depression or anxiety.  No memory loss.         Objective:   Physical Exam Vitals:   02/17/16 1133  BP: (!) 142/72  Pulse: 90  Temp: 98.2 F (36.8 C)  TempSrc: Oral  SpO2: 95%  Weight: 200 lb (90.7 kg)  Height: 5\' 6"  (1.676 m)   GEN: A/Ox3; pleasant , NAD, elderly obese in wc on O2 , chronically ill appearing    HEENT:  [place]/AT,  EACs-clear, TMs-wnl, NOSE-clear, THROAT-clear, no lesions, no postnasal drip or exudate noted.   NECK:  Supple w/ fair ROM; no JVD; normal carotid impulses w/o bruits; no thyromegaly or nodules palpated; no lymphadenopathy.    RESP  Decreased BS in bases ,  w/o, wheezes/ rales/ or rhonchi. no accessory muscle use, no dullness to percussion  CARD:  RRR, no m/r/g  , no peripheral edema, pulses intact, no cyanosis or clubbing.  GI:   Soft & nt; nml bowel sounds; no organomegaly or masses detected.   Musco: Warm bil, no deformities or joint swelling noted.   Neuro: alert, no focal deficits noted.    Skin: Warm, no lesions or rashes  Tammy Parrett NP-C  [facility]  02/17/2016

## 2016-02-17 NOTE — Telephone Encounter (Signed)
Katie please advise of a slot that we can use for this pt in October.  thanks

## 2016-02-17 NOTE — Assessment & Plan Note (Signed)
Cont on O2  Cont on BIPAP At bedtime    

## 2016-02-17 NOTE — Assessment & Plan Note (Signed)
Recent COPD exacerbation, now resolving  Plan  Patient Instructions  Flu shot today .  Continue on BIPAP At bedtime   Continue on Symbicort 2 puffs twice daily. Continue on albuterol and Atrovent nebulizer 4 times a day Follow-up with Dr. Annamaria Boots in 2 months and as needed Taper prednisone as directed to 10 mg daily and hold at this dose Please contact office for sooner follow up if symptoms do not improve or worsen or seek emergency care

## 2016-02-18 NOTE — Telephone Encounter (Signed)
Called spoke with pt. appt scheduled. Nothing further needed 

## 2016-02-18 NOTE — Telephone Encounter (Signed)
Pt can be seen 04/12/16 at 4:15pm slot (30 min OV). Thanks.

## 2016-02-20 ENCOUNTER — Telehealth: Payer: Self-pay | Admitting: Internal Medicine

## 2016-02-20 NOTE — Telephone Encounter (Signed)
Rec'd FMLA for patient daughter [name] via interoffice mail from [employer]. Will hand to Katie to have CY complete. CY is on vacation until next Thursday. He will complete upon his return- 02/20/16-pr

## 2016-03-01 NOTE — Telephone Encounter (Signed)
Rec'd completed FMLA paperwork from CY. Sent to [employer] via interoffice mailed - 03/01/16-pr

## 2016-03-04 ENCOUNTER — Telehealth: Payer: Self-pay | Admitting: Internal Medicine

## 2016-03-04 ENCOUNTER — Inpatient Hospital Stay (HOSPITAL_COMMUNITY)
Admission: EM | Admit: 2016-03-04 | Discharge: 2016-03-06 | DRG: 190 | Disposition: A | Discharge status: Home Health | Payer: Medicare HMO | Attending: Internal Medicine | Admitting: Internal Medicine

## 2016-03-04 ENCOUNTER — Emergency Department (HOSPITAL_COMMUNITY): Payer: Medicare HMO

## 2016-03-04 ENCOUNTER — Encounter (HOSPITAL_COMMUNITY): Payer: Self-pay | Admitting: Nurse Practitioner

## 2016-03-04 DIAGNOSIS — Z7952 Long term (current) use of systemic steroids: Secondary | ICD-10-CM | POA: Diagnosis not present

## 2016-03-04 DIAGNOSIS — Z79899 Other long term (current) drug therapy: Secondary | ICD-10-CM | POA: Diagnosis not present

## 2016-03-04 DIAGNOSIS — Z825 Family history of asthma and other chronic lower respiratory diseases: Secondary | ICD-10-CM | POA: Diagnosis not present

## 2016-03-04 DIAGNOSIS — G4733 Obstructive sleep apnea (adult) (pediatric): Secondary | ICD-10-CM | POA: Diagnosis present

## 2016-03-04 DIAGNOSIS — Z515 Encounter for palliative care: Secondary | ICD-10-CM | POA: Diagnosis present

## 2016-03-04 DIAGNOSIS — Z885 Allergy status to narcotic agent status: Secondary | ICD-10-CM | POA: Diagnosis not present

## 2016-03-04 DIAGNOSIS — J438 Other emphysema: Secondary | ICD-10-CM

## 2016-03-04 DIAGNOSIS — Z888 Allergy status to other drugs, medicaments and biological substances status: Secondary | ICD-10-CM | POA: Diagnosis not present

## 2016-03-04 DIAGNOSIS — E119 Type 2 diabetes mellitus without complications: Secondary | ICD-10-CM | POA: Diagnosis present

## 2016-03-04 DIAGNOSIS — Z66 Do not resuscitate: Secondary | ICD-10-CM | POA: Diagnosis present

## 2016-03-04 DIAGNOSIS — F419 Anxiety disorder, unspecified: Secondary | ICD-10-CM | POA: Diagnosis present

## 2016-03-04 DIAGNOSIS — Z8249 Family history of ischemic heart disease and other diseases of the circulatory system: Secondary | ICD-10-CM | POA: Diagnosis not present

## 2016-03-04 DIAGNOSIS — Z87891 Personal history of nicotine dependence: Secondary | ICD-10-CM

## 2016-03-04 DIAGNOSIS — G9340 Encephalopathy, unspecified: Secondary | ICD-10-CM | POA: Diagnosis present

## 2016-03-04 DIAGNOSIS — Z85038 Personal history of other malignant neoplasm of large intestine: Secondary | ICD-10-CM

## 2016-03-04 DIAGNOSIS — Z7189 Other specified counseling: Secondary | ICD-10-CM | POA: Diagnosis not present

## 2016-03-04 DIAGNOSIS — F329 Major depressive disorder, single episode, unspecified: Secondary | ICD-10-CM | POA: Diagnosis present

## 2016-03-04 DIAGNOSIS — Z7984 Long term (current) use of oral hypoglycemic drugs: Secondary | ICD-10-CM

## 2016-03-04 DIAGNOSIS — J9602 Acute respiratory failure with hypercapnia: Secondary | ICD-10-CM

## 2016-03-04 DIAGNOSIS — R0600 Dyspnea, unspecified: Secondary | ICD-10-CM | POA: Diagnosis present

## 2016-03-04 DIAGNOSIS — J9621 Acute and chronic respiratory failure with hypoxia: Secondary | ICD-10-CM | POA: Diagnosis present

## 2016-03-04 DIAGNOSIS — J441 Chronic obstructive pulmonary disease with (acute) exacerbation: Principal | ICD-10-CM | POA: Diagnosis present

## 2016-03-04 DIAGNOSIS — J9622 Acute and chronic respiratory failure with hypercapnia: Secondary | ICD-10-CM | POA: Diagnosis present

## 2016-03-04 DIAGNOSIS — Z7951 Long term (current) use of inhaled steroids: Secondary | ICD-10-CM | POA: Diagnosis not present

## 2016-03-04 DIAGNOSIS — J449 Chronic obstructive pulmonary disease, unspecified: Secondary | ICD-10-CM | POA: Diagnosis present

## 2016-03-04 LAB — CBC WITH DIFFERENTIAL/PLATELET
BASOS ABS: 0 10*3/uL (ref 0.0–0.1)
BASOS PCT: 0 %
EOS ABS: 0.1 10*3/uL (ref 0.0–0.7)
EOS PCT: 2 %
HCT: 34.2 % — ABNORMAL LOW (ref 36.0–46.0)
Hemoglobin: 10.3 g/dL — ABNORMAL LOW (ref 12.0–15.0)
Lymphocytes Relative: 8 %
Lymphs Abs: 0.7 10*3/uL (ref 0.7–4.0)
MCH: 29.1 pg (ref 26.0–34.0)
MCHC: 30.1 g/dL (ref 30.0–36.0)
MCV: 96.6 fL (ref 78.0–100.0)
MONO ABS: 0.5 10*3/uL (ref 0.1–1.0)
Monocytes Relative: 5 %
Neutro Abs: 7.8 10*3/uL — ABNORMAL HIGH (ref 1.7–7.7)
Neutrophils Relative %: 85 %
PLATELETS: 214 10*3/uL (ref 150–400)
RBC: 3.54 MIL/uL — ABNORMAL LOW (ref 3.87–5.11)
RDW: 13.1 % (ref 11.5–15.5)
WBC: 9.1 10*3/uL (ref 4.0–10.5)

## 2016-03-04 LAB — BASIC METABOLIC PANEL
ANION GAP: 5 (ref 5–15)
BUN: 16 mg/dL (ref 6–20)
CALCIUM: 8.3 mg/dL — AB (ref 8.9–10.3)
CO2: 41 mmol/L — ABNORMAL HIGH (ref 22–32)
Chloride: 94 mmol/L — ABNORMAL LOW (ref 101–111)
Creatinine, Ser: 0.88 mg/dL (ref 0.44–1.00)
Glucose, Bld: 252 mg/dL — ABNORMAL HIGH (ref 65–99)
Potassium: 4.9 mmol/L (ref 3.5–5.1)
Sodium: 140 mmol/L (ref 135–145)

## 2016-03-04 LAB — BLOOD GAS, ARTERIAL
ACID-BASE EXCESS: 8.9 mmol/L — AB (ref 0.0–2.0)
ACID-BASE EXCESS: 11.5 mmol/L — AB (ref 0.0–2.0)
Bicarbonate: 37.3 mmol/L — ABNORMAL HIGH (ref 20.0–28.0)
Bicarbonate: 40.4 mmol/L — ABNORMAL HIGH (ref 20.0–28.0)
DRAWN BY: 331471
Drawn by: 331471
O2 Content: 4 L/min
O2 Content: 3.5 L/min
O2 SAT: 92.4 %
O2 Saturation: 90.8 %
PCO2 ART: 79.5 mmHg — AB (ref 32.0–48.0)
PH ART: 7.293 — AB (ref 7.350–7.450)
PH ART: 7.294 — AB (ref 7.350–7.450)
PO2 ART: 70 mmHg — AB (ref 83.0–108.0)
Patient temperature: 98.6
Patient temperature: 98.6
pCO2 arterial: 86 mmHg (ref 32.0–48.0)
pO2, Arterial: 65.3 mmHg — ABNORMAL LOW (ref 83.0–108.0)

## 2016-03-04 LAB — I-STAT TROPONIN, ED: Troponin i, poc: 0 ng/mL (ref 0.00–0.08)

## 2016-03-04 LAB — GLUCOSE, CAPILLARY
GLUCOSE-CAPILLARY: 175 mg/dL — AB (ref 65–99)
GLUCOSE-CAPILLARY: 190 mg/dL — AB (ref 65–99)

## 2016-03-04 LAB — BRAIN NATRIURETIC PEPTIDE: B NATRIURETIC PEPTIDE 5: 112.4 pg/mL — AB (ref 0.0–100.0)

## 2016-03-04 MED ORDER — PREDNISONE 20 MG PO TABS
60.0000 mg | ORAL_TABLET | Freq: Every day | ORAL | Status: DC
Start: 2016-03-05 — End: 2016-03-04

## 2016-03-04 MED ORDER — ENOXAPARIN SODIUM 40 MG/0.4ML ~~LOC~~ SOLN
40.0000 mg | SUBCUTANEOUS | Status: DC
  Administered 2016-03-04 – 2016-03-05 (×2): 40 mg via SUBCUTANEOUS
  Filled 2016-03-04 (×2): qty 0.4

## 2016-03-04 MED ORDER — ALPRAZOLAM 0.5 MG PO TABS
0.5000 mg | ORAL_TABLET | Freq: Three times a day (TID) | ORAL | Status: DC | PRN
  Administered 2016-03-04: 0.5 mg via ORAL
  Administered 2016-03-05: 1 mg via ORAL
  Filled 2016-03-04: qty 1
  Filled 2016-03-04 (×2): qty 2

## 2016-03-04 MED ORDER — HYDROMORPHONE HCL 1 MG/ML IJ SOLN
0.2000 mg | INTRAMUSCULAR | Status: DC | PRN
  Administered 2016-03-04: 0.4 mg via INTRAVENOUS
  Administered 2016-03-05: 0.2 mg via INTRAVENOUS
  Filled 2016-03-04 (×2): qty 1

## 2016-03-04 MED ORDER — LORAZEPAM 2 MG/ML IJ SOLN
1.0000 mg | Freq: Once | INTRAMUSCULAR | Status: AC
  Administered 2016-03-04: 1 mg via INTRAVENOUS
  Filled 2016-03-04: qty 1

## 2016-03-04 MED ORDER — IPRATROPIUM-ALBUTEROL 0.5-2.5 (3) MG/3ML IN SOLN
3.0000 mL | Freq: Four times a day (QID) | RESPIRATORY_TRACT | Status: DC
  Administered 2016-03-04 (×2): 3 mL via RESPIRATORY_TRACT
  Filled 2016-03-04: qty 3

## 2016-03-04 MED ORDER — GUAIFENESIN ER 600 MG PO TB12: 1200.0000 mg | ORAL_TABLET | Freq: Every day | ORAL | Status: DC

## 2016-03-04 MED ORDER — PANTOPRAZOLE SODIUM 40 MG PO TBEC
40.0000 mg | DELAYED_RELEASE_TABLET | Freq: Every day | ORAL | Status: DC
  Administered 2016-03-05 – 2016-03-06 (×2): 40 mg via ORAL
  Filled 2016-03-04 (×2): qty 1

## 2016-03-04 MED ORDER — ALBUTEROL SULFATE (2.5 MG/3ML) 0.083% IN NEBU: 2.5000 mg | INHALATION_SOLUTION | RESPIRATORY_TRACT | Status: DC | PRN

## 2016-03-04 MED ORDER — GUAIFENESIN ER 600 MG PO TB12
1200.0000 mg | ORAL_TABLET | Freq: Every day | ORAL | Status: DC
  Administered 2016-03-05 – 2016-03-06 (×2): 1200 mg via ORAL
  Filled 2016-03-04 (×2): qty 2

## 2016-03-04 MED ORDER — IPRATROPIUM-ALBUTEROL 0.5-2.5 (3) MG/3ML IN SOLN
3.0000 mL | Freq: Three times a day (TID) | RESPIRATORY_TRACT | Status: DC
  Administered 2016-03-05 – 2016-03-06 (×4): 3 mL via RESPIRATORY_TRACT
  Filled 2016-03-04 (×4): qty 3

## 2016-03-04 MED ORDER — MOMETASONE FURO-FORMOTEROL FUM 200-5 MCG/ACT IN AERO
2.0000 | INHALATION_SPRAY | Freq: Two times a day (BID) | RESPIRATORY_TRACT | Status: DC
  Administered 2016-03-04 – 2016-03-06 (×4): 2 via RESPIRATORY_TRACT
  Filled 2016-03-04: qty 8.8

## 2016-03-04 MED ORDER — GLIPIZIDE ER 2.5 MG PO TB24: 2.5000 mg | ORAL_TABLET | Freq: Every day | ORAL | Status: DC

## 2016-03-04 MED ORDER — VITAMIN C 500 MG PO TABS
1000.0000 mg | ORAL_TABLET | Freq: Every day | ORAL | Status: DC
  Administered 2016-03-05 – 2016-03-06 (×2): 1000 mg via ORAL
  Filled 2016-03-04 (×2): qty 2

## 2016-03-04 MED ORDER — IPRATROPIUM BROMIDE 0.02 % IN SOLN: 0.5000 mg | Freq: Four times a day (QID) | RESPIRATORY_TRACT | Status: DC

## 2016-03-04 MED ORDER — INSULIN ASPART 100 UNIT/ML ~~LOC~~ SOLN
0.0000 [IU] | Freq: Three times a day (TID) | SUBCUTANEOUS | Status: DC
Start: 2016-03-04 — End: 2016-03-06
  Administered 2016-03-04: 2 [IU] via SUBCUTANEOUS
  Administered 2016-03-05: 5 [IU] via SUBCUTANEOUS
  Administered 2016-03-05: 3 [IU] via SUBCUTANEOUS
  Administered 2016-03-05: 5 [IU] via SUBCUTANEOUS
  Administered 2016-03-06: 3 [IU] via SUBCUTANEOUS

## 2016-03-04 MED ORDER — ADULT MULTIVITAMIN W/MINERALS CH
1.0000 | ORAL_TABLET | Freq: Every day | ORAL | Status: DC
  Administered 2016-03-05 – 2016-03-06 (×2): 1 via ORAL
  Filled 2016-03-04 (×4): qty 1

## 2016-03-04 MED ORDER — METHYLPREDNISOLONE SODIUM SUCC 125 MG IJ SOLR
80.0000 mg | Freq: Four times a day (QID) | INTRAMUSCULAR | Status: DC
  Administered 2016-03-04 – 2016-03-05 (×4): 80 mg via INTRAVENOUS
  Filled 2016-03-04 (×4): qty 2

## 2016-03-04 MED ORDER — ALBUTEROL SULFATE (2.5 MG/3ML) 0.083% IN NEBU: 2.5000 mg | INHALATION_SOLUTION | Freq: Four times a day (QID) | RESPIRATORY_TRACT | Status: DC

## 2016-03-04 MED ORDER — IPRATROPIUM-ALBUTEROL 0.5-2.5 (3) MG/3ML IN SOLN: 3.0000 mL | RESPIRATORY_TRACT | Status: DC | PRN

## 2016-03-04 MED ORDER — METHYLPREDNISOLONE SODIUM SUCC 125 MG IJ SOLR: 80.0000 mg | Freq: Two times a day (BID) | INTRAMUSCULAR | Status: DC

## 2016-03-04 NOTE — Progress Notes (Signed)
Met with patient and family.  She is awake and alert and joking with family at time of encounter.    We discussed symptoms and she currently denies any concerns including denying pain or SOB.  We discussed care plan for the evening and she wants to continue current plan, treat treatable illness, and meet tomorrow to continue conversation on her long term goals. She confirmed DNR/DNI status in the event of decline with cardiac or pulmonary arrest.  She has intolerance to morphine and I discussed risk vs benefit of opioids in the treatment of dyspnea with COPD and respiratory failure in case she decompensates this evening.  Plan for addition of low dose dilaudid on as needed basis if she becomes symptomatic in the event of decline.  Full consult to follow.  Micheline Rough, MD [facility] [phone]

## 2016-03-04 NOTE — Telephone Encounter (Signed)
I agree- if O2 sat is really in 60's on 4L then she should go to ER for evaluation.

## 2016-03-04 NOTE — ED Notes (Signed)
RT at bedside for ABG

## 2016-03-04 NOTE — Telephone Encounter (Signed)
Spoke with pt's daughter. She is aware of CY's recommendation. Lattie Haw is going to take pt to [facility]. Nothing further was needed at this time.

## 2016-03-04 NOTE — Progress Notes (Signed)
Pt refused to go back on BIPAP when arrived to ICU. Family and RN at bedside. Pt stated she would go back on after she eats and visits with her family.

## 2016-03-04 NOTE — ED Notes (Signed)
Called ICU Charge RN to notify of 20 minutes arrival.

## 2016-03-04 NOTE — Progress Notes (Signed)
Pt went back on  BIPAP at this time. Family and RN at bedside.

## 2016-03-04 NOTE — H&P (Signed)
History and Physical    Meredith [name] [id number] DOB: [birth year] DOA: 03/04/2016  Referring MD/NP/PA: Margarita Mail   PCP: Tula Nakayama   Outpatient Specialists: Dr. Annamaria Boots pulmonologist   Patient coming from: Home   Chief Complaint: dyspnea and confusion   HPI: Meredith [name] is a 75 y.o. female with known chronic respiratory failure due to end stage COPD on 4 L nasal cannula, obstructive sleep apnea, depression, recently discharged on August 20th, 2017 (hospitalized for AECOPD) presented with main concern of one-week duration of progressively worsening exertional dyspnea as well as dyspnea at rest associated with wheezing, non productive and productive cough of clear sputum, malaise, poor oral intake, confusion. Pt's daughters at bedside and provide history as pt is on BiPAP and somnolent. Daughters explain pt has end stage lung disease and know this is another flare up of her COPD. They are clear that mother does not want to be intubated, DNR status confirmed. Family OK with palliative medicine team to assist with comfort care as well.   In ED, pt in resp distress with accessory muscles use, RR in 30's, currently on BiPAP, ABG notable for pH 7.294, pCO2 86, pO2 65. Admit to SDU. Continue BD's, solumedrol, PCT consulted.   Discharge Diagnoses:  Acute on chronic respiratory failure with hypoxia and hypercapnia - Secondary to COPD exacerbation - still needs BiPAP - continue BD's scheduled and as needed, continue solumedrol - PCT consulted for further [place] discussion - family made aware that pt is critically ill and daughters are clear that pt is DNR and comfort is desired - daughter also say pt is intolerant to morphine  Acute encephalopathy secondary to AECOPD - monitor mental status in SDU   Diabetes mellitus type 2 - 02/05/16--Hemoglobin A1c--6.5 - Hold glipizide during hospitalization - NovoLog sliding scale  Depression/anxiety - Continue alprazolam when  necessary  Review of Systems:  Unable to obtain as pt is somnolent   Past Medical History:  Diagnosis Date  . Anxiety   . Chronic respiratory failure with hypoxia and hypercapnia (HCC)    Nocturnal BiPAP / 4 L/m at rest & 5 L/m with exertion  . Colon cancer ([place])    colon cancer  . COPD, very severe ([place])   . DM type 2 (diabetes mellitus, type 2) (HCC)    steroid induced  . Insomnia     Past Surgical History:  Procedure Laterality Date  . COLON SURGERY    . Cyst removed from Neck    . FOOT SURGERY Right    Social hx:  reports that she quit smoking about 7 years ago. Her smoking use included Cigarettes. She has a 80.00 pack-year smoking history. She quit smokeless tobacco use about 7 years ago. She reports that she does not drink alcohol. Her drug history is not on file.  Allergies  Allergen Reactions  . Morphine     Made high strung and have hallucination   . Tramadol Other (See Comments)    Makes her mean  . Zoloft [Sertraline Hcl] Other (See Comments)    Made her cry a lot    Family History  Problem Relation Age of Onset  . COPD Mother   . Heart attack Father   . COPD Sister   . COPD Brother   . COPD Sister     Prior to Admission medications   Medication Sig Start Date End Date Taking? Authorizing Provider  albuterol (PROVENTIL HFA;VENTOLIN HFA) 108 (90 Base) MCG/ACT inhaler Inhale 2 puffs into  the lungs every 6 (six) hours as needed for wheezing or shortness of breath. 09/11/15   Deneise Lever, MD  albuterol (PROVENTIL) (2.5 MG/3ML) 0.083% nebulizer solution Use 1 vial in nebulizer every 2-4 hours for  Shortness of breath and wheezing 04/14/15   Deneise Lever, MD  ALPRAZolam Duanne Moron) 1 MG tablet Take 1 mg by mouth 3 (three) times daily as needed for anxiety or sleep. Take 1/2 to 1 tablet by mouth three times daily as needed    Historical Provider, MD  glipiZIDE (GLUCOTROL XL) 2.5 MG 24 hr tablet Take 2.5 mg by mouth daily.      Historical Provider, MD    guaiFENesin (MUCINEX) 600 MG 12 hr tablet Take 1,200 mg by mouth daily.     Historical Provider, MD  ipratropium (ATROVENT) 0.02 % nebulizer solution Use in nebulizer four times daily as needed. Patient taking differently: Take by nebulization. Use in nebulizer four times daily as needed. 04/15/15   Deneise Lever, MD  ipratropium (ATROVENT) 0.06 % nasal spray USE 1-2 SPRAYS IN EACH NOSTRIL 4 TIMES DAILY FOR RUNNY NOSE Patient taking differently: USE 1-2 SPRAYS IN EACH NOSTRIL twice daily as needed FOR RUNNY NOSE 07/26/14   Deneise Lever, MD  Multiple Vitamins-Minerals (CENTRUM SILVER ADULT 50+) TABS Take 1 tablet by mouth daily.    Historical Provider, MD  nystatin (MYCOSTATIN) 100000 UNIT/ML suspension TAKE 5MLS BY MOUTH 4 TIMES A DAY Patient taking differently: Use as directed 5 mLs in the mouth or throat 2 (two) times daily as needed (for thrush).  01/15/15   Deneise Lever, MD  omeprazole (PRILOSEC) 40 MG capsule Take 1 capsule (40 mg total) by mouth daily. 02/09/16   Orson Eva, MD  predniSONE (DELTASONE) 10 MG tablet Take 6 tablets (60 mg total) by mouth daily with breakfast. Decrease by one tablet every 2 days 02/09/16   Orson Eva, MD  [facility] 160-4.5 MCG/ACT inhaler USE 2 PUFFS TWICE DAILY 07/04/15   Historical Provider, MD  triamcinolone ointment (KENALOG) 0.1 % Apply 1 application topically daily as needed (for itching).  12/01/15   Historical Provider, MD  vitamin C (ASCORBIC ACID) 500 MG tablet Take 1,000 mg by mouth daily.     Historical Provider, MD    Physical Exam: Vitals:   03/04/16 1251 03/04/16 1252 03/04/16 1314  BP: 130/90  108/88  Pulse: 90  91  Resp: (!) 30  18  SpO2: (!) 89% (!) 89% 98%  Weight: 90.7 kg (200 lb)    Height: 5\' 6"  (1.676 m)      Constitutional: in moderate distress due to dyspnea, on BiPAP Vitals:   03/04/16 1251 03/04/16 1252 03/04/16 1314  BP: 130/90  108/88  Pulse: 90  91  Resp: (!) 30  18  SpO2: (!) 89% (!) 89% 98%  Weight: 90.7 kg (200 lb)     Height: 5\' 6"  (1.676 m)     Eyes: PERRL, lids and conjunctivae normal ENMT: Mucous membranes are dry Posterior pharynx clear of any exudate or lesions. Neck: normal, supple, no masses, no thyromegaly Respiratory: course breath sounds bilaterally with exp wheezing, diminished BS throughout  Cardiovascular: Regular rate and rhythm, no murmurs / rubs / gallops. No extremity edema.  Abdomen: no tenderness, no masses palpated. No hepatosplenomegaly. Bowel sounds positive.  Musculoskeletal: no clubbing / cyanosis. No joint deformity upper and lower extremities. .  Skin: no rashes, lesions, ulcers. No induration Neurologic: somnolent but easy to awake, quickly doses off, moving all  4 extremities spont  Psychiatric: difficult to assess as pt is somnolent    Labs on Admission: I have personally reviewed following labs and imaging studies  CBC:  Recent Labs Lab 03/04/16 1342  WBC 9.1  NEUTROABS 7.8*  HGB 10.3*  HCT 34.2*  MCV 96.6  PLT [id number]   Basic Metabolic Panel:  Recent Labs Lab 03/04/16 1342  NA 140  K 4.9  CL 94*  CO2 41*  GLUCOSE 252*  BUN 16  CREATININE 0.88  CALCIUM 8.3*   Urine analysis:    Component Value Date/Time   COLORURINE YELLOW 02/03/2016 2059   APPEARANCEUR CLEAR 02/03/2016 2059   LABSPEC 1.019 02/03/2016 2059   PHURINE 6.5 02/03/2016 2059   GLUCOSEU 100 (A) 02/03/2016 2059   HGBUR NEGATIVE 02/03/2016 2059   BILIRUBINUR NEGATIVE 02/03/2016 2059   KETONESUR NEGATIVE 02/03/2016 2059   PROTEINUR NEGATIVE 02/03/2016 2059   UROBILINOGEN 0.2 02/07/2011 1953   NITRITE NEGATIVE 02/03/2016 2059   LEUKOCYTESUR NEGATIVE 02/03/2016 2059    Radiological Exams on Admission: Dg Chest 2 View  Result Date: 03/04/2016 CLINICAL DATA:  Shortness of breath, history of COPD EXAM: CHEST  2 VIEW COMPARISON:  02/04/2016 FINDINGS: The heart size and mediastinal contours are within normal limits. Both lungs are clear. The visualized skeletal structures are unremarkable.  IMPRESSION: No active cardiopulmonary disease. Electronically Signed   By: Kathreen Devoid   On: 03/04/2016 14:29    EKG - pending   DVT prophylaxis: Lovenox SQ Code Status: DNR Family Communication: Daughter updated at bedside Disposition Plan: To be determined  Consults called: PCT Admission status: Inpatient    Faye Ramsay MD [facility] Pager [phone]  If 7PM-7AM, please contact night-coverage [email or web address] Password [email or web address]  03/04/2016, 3:05 PM

## 2016-03-04 NOTE — ED Triage Notes (Signed)
SOB for three days COPD on home o2 was [facility] weeks ago for same thing did not have pnuemonia then. No pain

## 2016-03-04 NOTE — Progress Notes (Signed)
Rt placed pt on BIPAP in ED.

## 2016-03-04 NOTE — ED Notes (Signed)
Bed: WA08 Expected date:  Expected time:  Means of arrival:  Comments: EMS-SOB/COPD

## 2016-03-04 NOTE — ED Provider Notes (Signed)
[facility] DEPT Provider Note   CSN: [id number] Arrival date & time: 03/04/16  1245     History   Chief Complaint Chief Complaint  Patient presents with  . Shortness of Breath  . COPD    HPI Meredith [name] is a 75 y.o. female with a history of COPD,. The patient arrives via EMS for difficulty breathing. She is chronically on 4-5 L of oxygen at all times. According to the EMR. The patient's daughter called her pulmonologist because her oxygen saturation was 60% on 4 L of oxygen this morning. He stated that she needed to come to the emergency room. The patient got 2.5 mg of albuterol prior to arrival in the emergency department. Patient states she feels much better and would really like to go home because she just got out of the hospital 2 weeks ago and does not want to go back in. She denies fevers, chest pain, calf or leg swelling, weight gain. HPI  Past Medical History:  Diagnosis Date  . Anxiety   . Chronic respiratory failure with hypoxia and hypercapnia (HCC)    Nocturnal BiPAP / 4 L/m at rest & 5 L/m with exertion  . Colon cancer ([place])    colon cancer  . 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 DM type 2 (diabetes mellitus, type 2) (HCC)    steroid induced  . Insomnia     Patient Active Problem List   Diagnosis Date Noted  . Acute bronchitis   . Hypoxemia   . Acute and chronic respiratory failure with hypoxia ([place])   . Acute respiratory failure with hypoxia and hypercarbia ([place]) 02/04/2016  . Acute exacerbation of chronic obstructive pulmonary disease (COPD) ([place]) 02/03/2016  . Acute on chronic respiratory failure with hypoxia ([place]) 02/03/2016  . Obesity hypoventilation syndrome ([place]) 02/03/2016  . Diabetes mellitus type 2, uncontrolled ([place]) 02/03/2016  . Chronic hypercapnic respiratory failure ([place]) 02/03/2016  . Anemia, chronic disease 02/03/2016  . CAP (community acquired pneumonia) 02/03/2016  . SIRS (systemic inflammatory response syndrome) ([place]) 02/03/2016  .  Diabetes mellitus with complication ([place])   . Depression 10/05/2011  . 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 NECK MASS 04/30/2010  . THRUSH 12/30/2009  . INSOMNIA 05/14/2009  . Chronic respiratory failure with hypoxia ([place]) 05/08/2008  . COLON CANCER 12/29/2007  . COPD mixed type ([place]) 12/29/2007    Past Surgical History:  Procedure Laterality Date  . COLON SURGERY    . Cyst removed from Neck    . FOOT SURGERY Right     OB History    No data available       Home Medications    Prior to Admission medications   Medication Sig Start Date End Date Taking? Authorizing Provider  albuterol (PROVENTIL HFA;VENTOLIN HFA) 108 (90 Base) MCG/ACT inhaler Inhale 2 puffs into the lungs every 6 (six) hours as needed for wheezing or shortness of breath. 09/11/15   Deneise Lever, MD  albuterol (PROVENTIL) (2.5 MG/3ML) 0.083% nebulizer solution Use 1 vial in nebulizer every 2-4 hours for  Shortness of breath and wheezing 04/14/15   Deneise Lever, MD  ALPRAZolam Duanne Moron) 1 MG tablet Take 1 mg by mouth 3 (three) times daily as needed for anxiety or sleep. Take 1/2 to 1 tablet by mouth three times daily as needed    Historical Provider, MD  glipiZIDE (GLUCOTROL XL) 2.5 MG 24 hr tablet Take 2.5 mg by mouth daily.      Historical Provider, MD  guaiFENesin ([place]) 600  MG 12 hr tablet Take 1,200 mg by mouth daily.     Historical Provider, MD  ipratropium (ATROVENT) 0.02 % nebulizer solution Use in nebulizer four times daily as needed. Patient taking differently: Take by nebulization. Use in nebulizer four times daily as needed. 04/15/15   Deneise Lever, MD  ipratropium (ATROVENT) 0.06 % nasal spray USE 1-2 SPRAYS IN EACH NOSTRIL 4 TIMES DAILY FOR RUNNY NOSE Patient taking differently: USE 1-2 SPRAYS IN EACH NOSTRIL twice daily as needed FOR RUNNY NOSE 07/26/14   Deneise Lever, MD  Multiple Vitamins-Minerals (CENTRUM SILVER ADULT 50+) TABS Take 1 tablet by mouth daily.    Historical Provider, MD  nystatin  (MYCOSTATIN) 100000 UNIT/ML suspension TAKE 5MLS BY MOUTH 4 TIMES A DAY Patient taking differently: Use as directed 5 mLs in the mouth or throat 2 (two) times daily as needed (for thrush).  01/15/15   Deneise Lever, MD  omeprazole (PRILOSEC) 40 MG capsule Take 1 capsule (40 mg total) by mouth daily. 02/09/16   Orson Eva, MD  predniSONE (DELTASONE) 10 MG tablet Take 6 tablets (60 mg total) by mouth daily with breakfast. Decrease by one tablet every 2 days 02/09/16   Orson Eva, MD  [facility] 160-4.5 MCG/ACT inhaler USE 2 PUFFS TWICE DAILY 07/04/15   Historical Provider, MD  triamcinolone ointment (KENALOG) 0.1 % Apply 1 application topically daily as needed (for itching).  12/01/15   Historical Provider, MD  vitamin C (ASCORBIC ACID) 500 MG tablet Take 1,000 mg by mouth daily.     Historical Provider, MD    Family History Family History  Problem Relation Age of Onset  . COPD Mother   . Heart attack Father   . COPD Sister   . COPD Brother   . COPD Sister     Social History Social History  Substance Use Topics  . Smoking status: Former Smoker    Packs/day: 2.00    Years: 40.00    Types: Cigarettes    Quit date: 06/21/2008  . Smokeless tobacco: Former Systems developer    Quit date: 09/03/2008  . Alcohol use No     Allergies   Morphine; Tramadol; and Zoloft [sertraline hcl]   Review of Systems Review of Systems  Ten systems reviewed and are negative for acute change, except as noted in the HPI.   Physical Exam Updated Vital Signs BP 108/88 (BP Location: Left Arm)   Pulse 91   Resp 18   Ht 5\' 6"  (1.676 m)   Wt 90.7 kg   SpO2 98%   BMI 32.28 kg/m   Physical Exam  Constitutional: She is oriented to person, place, and time. She appears well-developed and well-nourished.  HENT:  Head: Normocephalic and atraumatic.  Eyes: Conjunctivae and EOM are normal. Pupils are equal, round, and reactive to light.  Neck: Normal range of motion. No JVD present.  Cardiovascular: Normal rate and regular  rhythm.   Distant heart sounds  Pulmonary/Chest: No respiratory distress. She has no wheezes.  Patient speaking in full sentences No wheezes, poor air movement.  Abdominal: Soft. Bowel sounds are normal.  Neurological: She is alert and oriented to person, place, and time.  Skin: Skin is warm and dry.  Psychiatric:  Tearful  Nursing note and vitals reviewed.    ED Treatments / Results  Labs (all labs ordered are listed, but only abnormal results are displayed) Labs Reviewed  BLOOD GAS, ARTERIAL - Abnormal; Notable for the following:       Result  Value   pH, Arterial 7.293 (*)    pCO2 arterial 79.5 (*)    pO2, Arterial 70.0 (*)    Bicarbonate 37.3 (*)    Acid-Base Excess 8.9 (*)    All other components within normal limits  BASIC METABOLIC PANEL - Abnormal; Notable for the following:    Chloride 94 (*)    CO2 41 (*)    Glucose, Bld 252 (*)    Calcium 8.3 (*)    All other components within normal limits  BRAIN NATRIURETIC PEPTIDE - Abnormal; Notable for the following:    B Natriuretic Peptide 112.4 (*)    All other components within normal limits  CBC WITH DIFFERENTIAL/PLATELET - Abnormal; Notable for the following:    RBC 3.54 (*)    Hemoglobin 10.3 (*)    HCT 34.2 (*)    Neutro Abs 7.8 (*)    All other components within normal limits  I-STAT TROPOININ, ED    EKG  EKG Interpretation  Date/Time:  Thursday March 04 2016 13:46:24 EDT Ventricular Rate:  83 PR Interval:    QRS Duration: 99 QT Interval:  361 QTC Calculation: 425 R Axis:   38 Text Interpretation:  Sinus rhythm No STEMI.  Similar to prior tracings.  Confirmed by LONG MD, JOSHUA [phone]) on 03/04/2016 2:00:51 PM       Radiology Dg Chest 2 View  Result Date: 03/04/2016 CLINICAL DATA:  Shortness of breath, history of COPD EXAM: CHEST  2 VIEW COMPARISON:  02/04/2016 FINDINGS: The heart size and mediastinal contours are within normal limits. Both lungs are clear. The visualized skeletal structures are  unremarkable. IMPRESSION: No active cardiopulmonary disease. Electronically Signed   By: Kathreen Devoid   On: 03/04/2016 14:29    Procedures Procedures (including critical care time)  Medications Ordered in ED Medications - No data to display   Initial Impression / Assessment and Plan / ED Course  I have reviewed the triage vital signs and the nursing notes.  Pertinent labs & imaging results that were available during my care of the patient were reviewed by me and considered in my medical decision making (see chart for details).  Clinical Course  Value Comment By Time  pH, Arterial: (!) 7.293 Uncompensated respiratory acidosis Margarita Mail, PA-C 09/14 1409   Patient with acute on chronic respiratory failure with uncompensated respiratory acidosis and hypercapnia. Her respiratory rate and oxygen saturations have improved since getting to the emergency department.  I feel she would need admission. Patient seen in shared visit with attending physician who agrees with POC. Margarita Mail, PA-C 09/14 1456      Final Clinical Impressions(s) / ED Diagnoses   Final diagnoses:  Acute respiratory failure with hypercapnia [facility])    New Prescriptions New Prescriptions   No medications on file     Margarita Mail, PA-C 03/04/16 2053    Leo Grosser, MD 03/06/16 1124

## 2016-03-04 NOTE — Progress Notes (Signed)
Received from Ed. Pt. Anxious, tearful and restless. Pt. Refused BiPaP but was will to try again after taking P.O. Zanax. Family at beside attempting to support patient.

## 2016-03-04 NOTE — Telephone Encounter (Signed)
Spoke with pt's daughter, Lattie Haw. States that for the past 2 days pt has been having increased SOB. Denies chest tightness, wheezing or coughing. Lattie Haw took her to see her PCP yesterday and her CO2 level was elevated >> PCP is going to fax over this report. This morning the pt's oxygen level was in the 60's while on 4L. Guy Franco that if the pt's oxygen level was that she would need to be seen in the ER. Lattie Haw would like CY's recommendations on what to do.  Allergies  Allergen Reactions  . Morphine     Made high strung and have hallucination   . Tramadol Other (See Comments)    Makes her mean  . Zoloft [Sertraline Hcl] Other (See Comments)    Made her cry a lot   Current Outpatient Prescriptions on File Prior to Visit  Medication Sig Dispense Refill  . albuterol (PROVENTIL HFA;VENTOLIN HFA) 108 (90 Base) MCG/ACT inhaler Inhale 2 puffs into the lungs every 6 (six) hours as needed for wheezing or shortness of breath. 1 Inhaler 5  . albuterol (PROVENTIL) (2.5 MG/3ML) 0.083% nebulizer solution Use 1 vial in nebulizer every 2-4 hours for  Shortness of breath and wheezing 90 vial 1  . ALPRAZolam (XANAX) 1 MG tablet Take 1 mg by mouth 3 (three) times daily as needed for anxiety or sleep. Take 1/2 to 1 tablet by mouth three times daily as needed    . glipiZIDE (GLUCOTROL XL) 2.5 MG 24 hr tablet Take 2.5 mg by mouth daily.      Marland Kitchen guaiFENesin (MUCINEX) 600 MG 12 hr tablet Take 1,200 mg by mouth daily.     Marland Kitchen ipratropium (ATROVENT) 0.02 % nebulizer solution Use in nebulizer four times daily as needed. (Patient taking differently: Take by nebulization. Use in nebulizer four times daily as needed.) 300 mL 5  . ipratropium (ATROVENT) 0.06 % nasal spray USE 1-2 SPRAYS IN EACH NOSTRIL 4 TIMES DAILY FOR RUNNY NOSE (Patient taking differently: USE 1-2 SPRAYS IN EACH NOSTRIL twice daily as needed FOR RUNNY NOSE) 15 mL 5  . Multiple Vitamins-Minerals (CENTRUM SILVER ADULT 50+) TABS Take 1 tablet by mouth daily.     Marland Kitchen nystatin (MYCOSTATIN) 100000 UNIT/ML suspension TAKE 5MLS BY MOUTH 4 TIMES A DAY (Patient taking differently: Use as directed 5 mLs in the mouth or throat 2 (two) times daily as needed (for thrush). ) 360 mL 0  . omeprazole (PRILOSEC) 40 MG capsule Take 1 capsule (40 mg total) by mouth daily. 30 capsule 0  . predniSONE (DELTASONE) 10 MG tablet Take 6 tablets (60 mg total) by mouth daily with breakfast. Decrease by one tablet every 2 days 42 tablet 0  . SYMBICORT 160-4.5 MCG/ACT inhaler USE 2 PUFFS TWICE DAILY  1  . triamcinolone ointment (KENALOG) 0.1 % Apply 1 application topically daily as needed (for itching).     . vitamin C (ASCORBIC ACID) 500 MG tablet Take 1,000 mg by mouth daily.      No current facility-administered medications on file prior to visit.     CY - please advise. Thanks.

## 2016-03-05 ENCOUNTER — Telehealth: Payer: Self-pay | Admitting: Internal Medicine

## 2016-03-05 DIAGNOSIS — J441 Chronic obstructive pulmonary disease with (acute) exacerbation: Principal | ICD-10-CM

## 2016-03-05 DIAGNOSIS — G4733 Obstructive sleep apnea (adult) (pediatric): Secondary | ICD-10-CM

## 2016-03-05 DIAGNOSIS — Z7189 Other specified counseling: Secondary | ICD-10-CM

## 2016-03-05 DIAGNOSIS — Z515 Encounter for palliative care: Secondary | ICD-10-CM

## 2016-03-05 DIAGNOSIS — R0600 Dyspnea, unspecified: Secondary | ICD-10-CM

## 2016-03-05 LAB — BASIC METABOLIC PANEL
ANION GAP: 7 (ref 5–15)
BUN: 17 mg/dL (ref 6–20)
CALCIUM: 8.7 mg/dL — AB (ref 8.9–10.3)
CHLORIDE: 93 mmol/L — AB (ref 101–111)
CO2: 39 mmol/L — AB (ref 22–32)
Creatinine, Ser: 0.87 mg/dL (ref 0.44–1.00)
GLUCOSE: 254 mg/dL — AB (ref 65–99)
POTASSIUM: 4.7 mmol/L (ref 3.5–5.1)
Sodium: 139 mmol/L (ref 135–145)

## 2016-03-05 LAB — GLUCOSE, CAPILLARY
GLUCOSE-CAPILLARY: 249 mg/dL — AB (ref 65–99)
GLUCOSE-CAPILLARY: 293 mg/dL — AB (ref 65–99)
GLUCOSE-CAPILLARY: 269 mg/dL — AB (ref 65–99)
Glucose-Capillary: 212 mg/dL — ABNORMAL HIGH (ref 65–99)

## 2016-03-05 LAB — CBC
HCT: 34.8 % — ABNORMAL LOW (ref 36.0–46.0)
Hemoglobin: 10.4 g/dL — ABNORMAL LOW (ref 12.0–15.0)
MCH: 29 pg (ref 26.0–34.0)
MCHC: 29.9 g/dL — AB (ref 30.0–36.0)
MCV: 96.9 fL (ref 78.0–100.0)
PLATELETS: 245 10*3/uL (ref 150–400)
RBC: 3.59 MIL/uL — AB (ref 3.87–5.11)
RDW: 13.1 % (ref 11.5–15.5)
WBC: 7.8 10*3/uL (ref 4.0–10.5)

## 2016-03-05 MED ORDER — ALPRAZOLAM 0.5 MG PO TABS
0.5000 mg | ORAL_TABLET | ORAL | Status: DC | PRN
  Administered 2016-03-05 – 2016-03-06 (×5): 1 mg via ORAL
  Filled 2016-03-05 (×5): qty 2

## 2016-03-05 MED ORDER — OXYCODONE HCL 20 MG/ML PO CONC
2.5000 mg | ORAL | Status: DC | PRN
  Administered 2016-03-06: 2.6 mg via ORAL
  Filled 2016-03-05: qty 1

## 2016-03-05 MED ORDER — METHYLPREDNISOLONE SODIUM SUCC 125 MG IJ SOLR
60.0000 mg | Freq: Two times a day (BID) | INTRAMUSCULAR | Status: DC
  Administered 2016-03-05 – 2016-03-06 (×2): 60 mg via INTRAVENOUS
  Filled 2016-03-05 (×2): qty 2

## 2016-03-05 MED ORDER — HYDROMORPHONE HCL 1 MG/ML IJ SOLN
0.2000 mg | INTRAMUSCULAR | Status: DC | PRN
  Administered 2016-03-05 (×2): 0.2 mg via INTRAVENOUS
  Filled 2016-03-05 (×2): qty 1

## 2016-03-05 NOTE — Telephone Encounter (Signed)
Lattie Haw, patient's daughter, returned call, please call her at [phone].

## 2016-03-05 NOTE — Telephone Encounter (Signed)
Ok to request DME do home CPAP service visit while patient is in hospital   Dx OSA

## 2016-03-05 NOTE — Telephone Encounter (Signed)
Spoke with pt's daughter Karna Christmas, states that they(terri and sister Lattie Haw, pt's daughters) are unsure if pt's cpap machine is working correctly- Terri could not go into further detail as to why they didn't think cpap was working incorrectly- states that pt lives with daughter Lattie Haw, who had asked Terri to call and request an order to have [facility] come to home and inspect cpap to make sure it is working properly.  Pt is currently admitted at [facility] and they want to ensure this works properly before pt comes home.  CY please advise on order.  Thanks!

## 2016-03-05 NOTE — Progress Notes (Signed)
Family brought in a note, which they state is from [facility] on [address]. Note is scribed on a piece of yellow legal pad, and reads that home BiPAP settings are auto titration 25, IPAP 10,  EPAP 5 and a face to face note as well as a new prescription is needed for insurance purposes to obtain a new machine due to the home equipment making abnormal sounds. RN aware and in possession of note.

## 2016-03-05 NOTE — Telephone Encounter (Signed)
I have placed the order for below to [facility]. Called Terri and LMTCB to make aware.

## 2016-03-05 NOTE — Progress Notes (Signed)
Inpatient Diabetes Program Recommendations  AACE/ADA: New Consensus Statement on Inpatient Glycemic Control (2015)  Target Ranges:  Prepandial:   less than 140 mg/dL      Peak postprandial:   less than 180 mg/dL (1-2 hours)      Critically ill patients:  140 - 180 mg/dL   Lab Results  Component Value Date   GLUCAP 293 (H) 03/05/2016   HGBA1C 6.5 (H) 02/05/2016    Review of Glycemic Control  Diabetes history: DM2 Outpatient Diabetes medications: glipizide Current orders for Inpatient glycemic control: Novolog sensitive tidwc  Blood sugars > 180 mg/dL with high-dose steroids. May benefit from insulin adjustment.  Inpatient Diabetes Program Recommendations:    Increase Novolog to moderate tidwc and hs  Will continue to follow. Thank you. Lorenda Peck, RD, LDN, CDE Inpatient Diabetes Coordinator [phone]

## 2016-03-05 NOTE — Consult Note (Signed)
Consultation Note Date: 03/05/2016   Patient Name: Meredith [name]  DOB: 1940-10-02  MRN: [id number]  Age / Sex: 75 y.o., female  PCP: Aletha Halim, PA-C Referring Physician: Theodis Blaze, MD  Reason for Consultation: Establishing goals of care and Non pain symptom management  HPI/Patient Profile: 75 y.o. 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   Clinical Assessment and Goals of Care: I met today with patient and her daughter.  Patient reports that she is feeling better today but is much more emotionally labile than yesterday when I met with her in the evening.  She reports the most important things to her are her family, being at home, and feeling as well as possible for as long as possible.  She really wants to go home as soon as possible.  She reports being scared about the fact that she has a disease that is not curable and she is going to continue having trouble with her COPD in the future.  She states that her breathing feels better today than yesterday. She did have doses of Dilaudid last evening. She does not remember taking them, however, her bedside nurse reports the overnight nurse reported that she did have relief of her dyspnea whenever she had them. We discussed continuation of oral opioids in low doses to see if this helps her symptomatically. She is agreeable to this.  Throughout the encounter, she has multiple occasions where she goes from being pleasant and conversational to very teary and back. Her daughter reports that she gets like this when she is in the hospital. I tried to explore this further with her and it seems that it might be related to whenever she is on higher dose of steroids for COPD exacerbations.   SUMMARY OF RECOMMENDATIONS   - Dyspnea: Plan  for addition of oxycodone in low dose orally to see if this helps with symptomatic relief of shortness of breath. She reports her shortness of breath is a big issue at home and it keeps her from doing daily activities. She thinks that if she had her symptoms better controlled that she'll be able to be more functional. - Anxiety: Continue Xanax as needed. She appears to have increased emotional lability today. I wonder if this is related to higher doses of steroids that she is on due to COPD exacerbation. Would continue to wean these as able based upon her clinical course. - Goals of care: She reports wanting to live as well as possible for as long as possible but also die a natural death when the time comes. She is DO NOT RESUSCITATE/DO NOT INTUBATE.  She was emotionally not in a place to further discuss today. I will continue to try and advance conversation she is admitted as an inpatient. I think that she may benefit from completion of a most form prior to discharge, but I am not sure this is something we'll  be able to complete if she continues to be as anxious and easily upset as she is today.  Code Status/Advance Care Planning:  DNR   Symptom Management:   As above  Palliative Prophylaxis:   Aspiration, Delirium Protocol and Frequent Pain Assessment  Additional Recommendations (Limitations, Scope, Preferences):  No intubation  Psycho-social/Spiritual:   Desire for further Chaplaincy support:No  Additional Recommendations: Caregiving  Support/Resources  Prognosis:  Unable to determine due to acuity of condition. She is at high risk for decompensation at any point   Discharge Planning: Home with [place] most likely     Primary Diagnoses: Present on Admission: . COPD (chronic obstructive pulmonary disease) ([place])   I have reviewed the medical record, interviewed the patient and family, and examined the patient. The following aspects are pertinent.  Past Medical History:    Diagnosis Date  . Anxiety   . Chronic respiratory failure with hypoxia and hypercapnia (HCC)    Nocturnal BiPAP / 4 L/m at rest & 5 L/m with exertion  . Colon cancer ([place])    colon cancer  . COPD, very severe ([place])   . DM type 2 (diabetes mellitus, type 2) (HCC)    steroid induced  . Insomnia    Social History   Social History  . Marital status: Widowed    Spouse name: N/A  . Number of children: N/A  . Years of education: N/A   Occupational History  . retired    Social History Main Topics  . Smoking status: Former Smoker    Packs/day: 2.00    Years: 40.00    Types: Cigarettes    Quit date: 06/21/2008  . Smokeless tobacco: Former Systems developer    Quit date: 09/03/2008  . Alcohol use No  . Drug use: Unknown  . Sexual activity: Not Asked   Other Topics Concern  . None   Social History Narrative   Has a home nurse that visits twice weekly. No pets. No bird or mold exposure. No recent travel.   Family History  Problem Relation Age of Onset  . COPD Mother   . Heart attack Father   . COPD Sister   . COPD Brother   . COPD Sister    Scheduled Meds: . enoxaparin (LOVENOX) injection  40 mg Subcutaneous Q24H  . guaiFENesin  1,200 mg Oral Daily  . insulin aspart  0-9 Units Subcutaneous TID WC  . ipratropium-albuterol  3 mL Nebulization TID  . methylPREDNISolone (SOLU-MEDROL) injection  60 mg Intravenous Q12H  . mometasone-formoterol  2 puff Inhalation BID  . multivitamin with minerals  1 tablet Oral Daily  . pantoprazole  40 mg Oral Daily  . vitamin C  1,000 mg Oral Daily   Continuous Infusions:  PRN Meds:.ALPRAZolam, HYDROmorphone (DILAUDID) injection, ipratropium-albuterol, oxyCODONE Medications Prior to Admission:  Prior to Admission medications   Medication Sig Start Date End Date Taking? Authorizing Provider  albuterol (PROVENTIL HFA;VENTOLIN HFA) 108 (90 Base) MCG/ACT inhaler Inhale 2 puffs into the lungs every 6 (six) hours as needed for wheezing or shortness of  breath. 09/11/15  Yes Deneise Lever, MD  albuterol (PROVENTIL) (2.5 MG/3ML) 0.083% nebulizer solution Use 1 vial in nebulizer every 2-4 hours for  Shortness of breath and wheezing 04/14/15  Yes Deneise Lever, MD  ALPRAZolam Duanne Moron) 1 MG tablet Take 1 mg by mouth 3 (three) times daily as needed for anxiety or sleep. Take 1/2 to 1 tablet by mouth three times daily as needed   Yes Historical  Provider, MD  glipiZIDE (GLUCOTROL XL) 2.5 MG 24 hr tablet Take 2.5 mg by mouth daily.     Yes Historical Provider, MD  guaiFENesin (MUCINEX) 600 MG 12 hr tablet Take 600 mg by mouth 2 (two) times daily.    Yes Historical Provider, MD  Multiple Vitamins-Minerals (CENTRUM SILVER ADULT 50+) TABS Take 1 tablet by mouth daily.   Yes Historical Provider, MD  naproxen sodium (ANAPROX) 220 MG tablet Take 220 mg by mouth 2 (two) times daily as needed (pain).   Yes Historical Provider, MD  nystatin (MYCOSTATIN) 100000 UNIT/ML suspension TAKE 5MLS BY MOUTH 4 TIMES A DAY Patient taking differently: Use as directed 5 mLs in the mouth or throat 2 (two) times daily as needed (for thrush).  01/15/15  Yes Deneise Lever, MD  omeprazole (PRILOSEC) 40 MG capsule Take 1 capsule (40 mg total) by mouth daily. 02/09/16  Yes Orson Eva, MD  predniSONE (DELTASONE) 10 MG tablet Take 6 tablets (60 mg total) by mouth daily with breakfast. Decrease by one tablet every 2 days Patient taking differently: Take 10 mg by mouth daily with breakfast.  02/09/16  Yes Orson Eva, MD  SYMBICORT 160-4.5 MCG/ACT inhaler USE 2 PUFFS TWICE DAILY 07/04/15  Yes Historical Provider, MD  triamcinolone ointment (KENALOG) 0.1 % Apply 1 application topically daily as needed (for itching).  12/01/15  Yes Historical Provider, MD  vitamin C (ASCORBIC ACID) 500 MG tablet Take 1,000 mg by mouth daily.    Yes Historical Provider, MD   Allergies  Allergen Reactions  . Morphine     Made high strung and have hallucination   . Tramadol Other (See Comments)    Makes her  mean  . Zoloft [Sertraline Hcl] Other (See Comments)    Made her cry a lot   Review of Systems  Constitutional: Positive for activity change, appetite change and fatigue.  Respiratory: Positive for cough and shortness of breath.   Neurological: Positive for weakness.  Psychiatric/Behavioral: Positive for decreased concentration, dysphoric mood and sleep disturbance. The patient is nervous/anxious.   All other systems reviewed and are negative.   Physical Exam  General: Alert, awake, Sitting in bedside chair in no respiratory distress.  HEENT: No bruits, no goiter, no JVD Heart: Regular rate and rhythm. No murmur appreciated. Lungs: Fair air movement, coarse throughout Abdomen: Soft, nontender, nondistended, positive bowel sounds.  Ext: No significant edema Skin: Warm and dry Neuro: Grossly intact, nonfocal. Psych: Anxious, labile and intermittently tearful   Vital Signs: BP (!) 156/54 (BP Location: Left Arm)   Pulse 100   Temp 98.5 F (36.9 C) (Oral)   Resp 19   Ht 5' 6"  (1.676 m)   Wt 90.7 kg (200 lb)   SpO2 91%   BMI 32.28 kg/m  Pain Assessment: No/denies pain    SpO2: SpO2: 91 % O2 Device:SpO2: 91 % O2 Flow Rate: .O2 Flow Rate (L/min): 4 L/min  IO: Intake/output summary:  Intake/Output Summary (Last 24 hours) at 03/05/16 1550 Last data filed at 03/05/16 1200  Gross per 24 hour  Intake              450 ml  Output             1400 ml  Net             -950 ml    LBM: Last BM Date:  (PTA) Baseline Weight: Weight: 90.7 kg (200 lb) Most recent weight: Weight: 90.7 kg (200 lb)  Palliative Assessment/Data:   Flowsheet Rows   Flowsheet Row Most Recent Value  Intake Tab  Referral Department  Hospitalist  Unit at Time of Referral  ICU  Palliative Care Primary Diagnosis  Pulmonary  Date Notified  03/04/16  Palliative Care Type  New Palliative care  Reason for referral  Clarify Goals of Care, Non-pain Symptom  Date of Admission  03/04/16  Date first seen by  Palliative Care  03/04/16  # of days Palliative referral response time  0 Day(s)  # of days IP prior to Palliative referral  0  Clinical Assessment  Palliative Performance Scale Score  50%  Pain Max last 24 hours  0  Pain Min Last 24 hours  0  Psychosocial & Spiritual Assessment  Palliative Care Outcomes  Patient/Family meeting held?  Yes  Who was at the meeting?  Patient, daughter  Palliative Care Outcomes  Improved non-pain symptom therapy, Clarified goals of care  Patient/Family wishes: Interventions discontinued/not started   Mechanical Ventilation      Time In: 1400 Time Out: 1500 Time Total: 60 Greater than 50%  of this time was spent counseling and coordinating care related to the above assessment and plan.  Signed by: Micheline Rough, MD   Please contact Palliative Medicine Team phone at [phone] for questions and concerns.  For individual provider: See Shea Evans

## 2016-03-05 NOTE — Care Management Note (Signed)
Case Management Note  Patient Details  Name: Meredith [name] MRN: [id number] Date of Birth: Aug 21, 1940  Subjective/Objective:     resp distress and use of bipap               Action/Plan: home with hhc   Expected Discharge Date:   (unknown)               Expected Discharge Plan:  [name]  In-House Referral:  NA  Discharge planning Services  CM Consult  Post Acute Care Choice:  Home Health Choice offered to:  NA  DME Arranged:    DME Agency:     HH Arranged:  Nurse's Aide [place] Agency:     Status of Service:  In process, will continue to follow  If discussed at Long Length of Stay Meetings, dates discussed:    Additional Comments: Date:  March 05, 2016 Chart reviewed for concurrent status and case management needs. Will continue to follow the patient for status change: Discharge Planning: following for needs Expected discharge date: [id number] Velva Harman, BSN, [place], [place] Leeroy Cha, RN 03/05/2016, 10:11 AM

## 2016-03-05 NOTE — Progress Notes (Signed)
Patient ID: Meredith [name], female   DOB: 11-08-1940, 75 y.o.   MRN: [id number]    PROGRESS NOTE    Meredith [name]  [id number] DOB: [birth year] DOA: 03/04/2016  PCP: Tula Nakayama   Brief Narrative:  75 y.o. female with known chronic respiratory failure due to end stage COPD on 4 L nasal cannula, obstructive sleep apnea, depression, recently discharged on August 20th, 2017 (hospitalized for AECOPD) presented with main concern of one-week duration of progressively worsening exertional dyspnea as well as dyspnea at rest associated with wheezing, non productive and productive cough of clear sputum, malaise, poor oral intake, confusion. Pt's daughters at bedside and provide history as pt is on BiPAP and somnolent. Daughters explain pt has end stage lung disease and know this is another flare up of her COPD. They are clear that mother does not want to be intubated, DNR status confirmed. Family OK with palliative medicine team to assist with comfort care as well.   In ED, pt in resp distress with accessory muscles use, RR in 30's, currently on BiPAP, ABG notable for pH 7.294, pCO2 86, pO2 65. Admit to SDU. Continue BD's, solumedrol, PCT consulted.   Discharge Diagnoses: Acute on chronic respiratory failure with hypoxia and hypercapnia - Secondary to COPD exacerbation - has been off BiPAP since this AM and currently on [place] looking better  - continue BD's scheduled and as needed, continue solumedrol - PCT consulted for further GOC discussion, assistance is appreciated  - pt wants to go home, I have discussed with her staying one more night to allow [place] to continue IV solumedrol - I think we can taper down solumedrol today but not switch to oral prednisone yet   Acute encephalopathy secondary to AECOPD - more clear, alert and awake  Diabetes mellitus type 2 - 02/05/16--Hemoglobin A1c--6.5 - Hold glipizide during hospitalization - NovoLog sliding scale  Depression/anxiety - Continue  alprazolam when necessary   DVT prophylaxis: Lovenox SQ Code Status: DNR Family Communication: Patient and daughters at bedside  Disposition Plan: Home in 1-2 days   Consultants:   PCT  Procedures:   None  Antimicrobials:   None   Subjective: Reports feeling better.   Objective: Vitals:   03/05/16 0800 03/05/16 0824 03/05/16 0845 03/05/16 1219  BP:  (!) 146/85  138/62  Pulse:  93  92  Resp:  17  (!) 22  Temp: 98.3 F (36.8 C)   98.5 F (36.9 C)  TempSrc: Oral   Oral  SpO2:  94% 96% (!) 86%  Weight:      Height:        Intake/Output Summary (Last 24 hours) at 03/05/16 1409 Last data filed at 03/05/16 1200  Gross per 24 hour  Intake              450 ml  Output             1400 ml  Net             -950 ml   Filed Weights   03/04/16 1251  Weight: 90.7 kg (200 lb)    Examination:  General exam: Appears calm and comfortable, on [place] Respiratory system: better air movement but still rather course, mild exp wheezing  Cardiovascular system: S1 & S2 heard, RRR. No JVD, murmurs, rubs, gallops or clicks. No pedal edema. Gastrointestinal system: Abdomen is nondistended, soft and nontender. No organomegaly or masses felt. Normal bowel sounds heard. Central nervous system: Alert and oriented. No focal neurological  deficits.  Data Reviewed: I have personally reviewed following labs and imaging studies  CBC:  Recent Labs Lab 03/04/16 1342 03/05/16 0349  WBC 9.1 7.8  NEUTROABS 7.8*  --   HGB 10.3* 10.4*  HCT 34.2* 34.8*  MCV 96.6 96.9  PLT 214 [id number]   Basic Metabolic Panel:  Recent Labs Lab 03/04/16 1342 03/05/16 0349  NA 140 139  K 4.9 4.7  CL 94* 93*  CO2 41* 39*  GLUCOSE 252* 254*  BUN 16 17  CREATININE 0.88 0.87  CALCIUM 8.3* 8.7*   CBG:  Recent Labs Lab 03/04/16 1659 03/04/16 2130 03/05/16 0800 03/05/16 1225  GLUCAP 175* 190* 249* 293*   Urine analysis:    Component Value Date/Time   COLORURINE YELLOW 02/03/2016 2059   APPEARANCEUR  CLEAR 02/03/2016 2059   LABSPEC 1.019 02/03/2016 2059   PHURINE 6.5 02/03/2016 2059   GLUCOSEU 100 (A) 02/03/2016 2059   HGBUR NEGATIVE 02/03/2016 2059   BILIRUBINUR NEGATIVE 02/03/2016 2059   KETONESUR NEGATIVE 02/03/2016 2059   PROTEINUR NEGATIVE 02/03/2016 2059   UROBILINOGEN 0.2 02/07/2011 1953   NITRITE NEGATIVE 02/03/2016 2059   LEUKOCYTESUR NEGATIVE 02/03/2016 2059   Radiology Studies: Dg Chest 2 View  Result Date: 03/04/2016 CLINICAL DATA:  Shortness of breath, history of COPD EXAM: CHEST  2 VIEW COMPARISON:  02/04/2016 FINDINGS: The heart size and mediastinal contours are within normal limits. Both lungs are clear. The visualized skeletal structures are unremarkable. IMPRESSION: No active cardiopulmonary disease. Electronically Signed   By: Kathreen Devoid   On: 03/04/2016 14:29      Scheduled Meds: . enoxaparin (LOVENOX) injection  40 mg Subcutaneous Q24H  . guaiFENesin  1,200 mg Oral Daily  . insulin aspart  0-9 Units Subcutaneous TID WC  . ipratropium-albuterol  3 mL Nebulization TID  . methylPREDNISolone (SOLU-MEDROL) injection  80 mg Intravenous Q6H  . mometasone-formoterol  2 puff Inhalation BID  . multivitamin with minerals  1 tablet Oral Daily  . pantoprazole  40 mg Oral Daily  . vitamin C  1,000 mg Oral Daily   Continuous Infusions:    LOS: 1 day    Time spent: 20 minutes    Faye Ramsay, MD [facility] Pager [phone]  If 7PM-7AM, please contact night-coverage [email or web address] Password [email or web address] 03/05/2016, 2:09 PM

## 2016-03-05 NOTE — Telephone Encounter (Signed)
Called made [facility] aware of below. Nothing further needed

## 2016-03-06 LAB — CBC
HEMATOCRIT: 32.5 % — AB (ref 36.0–46.0)
HEMOGLOBIN: 10.1 g/dL — AB (ref 12.0–15.0)
MCH: 29.3 pg (ref 26.0–34.0)
MCHC: 31.1 g/dL (ref 30.0–36.0)
MCV: 94.2 fL (ref 78.0–100.0)
Platelets: 272 10*3/uL (ref 150–400)
RBC: 3.45 MIL/uL — AB (ref 3.87–5.11)
RDW: 13.4 % (ref 11.5–15.5)
WBC: 9.2 10*3/uL (ref 4.0–10.5)

## 2016-03-06 LAB — BASIC METABOLIC PANEL
ANION GAP: 7 (ref 5–15)
BUN: 25 mg/dL — ABNORMAL HIGH (ref 6–20)
CHLORIDE: 95 mmol/L — AB (ref 101–111)
CO2: 36 mmol/L — AB (ref 22–32)
Calcium: 9 mg/dL (ref 8.9–10.3)
Creatinine, Ser: 0.96 mg/dL (ref 0.44–1.00)
GFR calc Af Amer: >60 mL/min (ref >60)
GFR calc non Af Amer: 57 mL/min — ABNORMAL LOW (ref >60)
GLUCOSE: 241 mg/dL — AB (ref 65–99)
POTASSIUM: 4.2 mmol/L (ref 3.5–5.1)
Sodium: 138 mmol/L (ref 135–145)

## 2016-03-06 LAB — GLUCOSE, CAPILLARY: Glucose-Capillary: 202 mg/dL — ABNORMAL HIGH (ref 65–99)

## 2016-03-06 MED ORDER — IPRATROPIUM-ALBUTEROL 0.5-2.5 (3) MG/3ML IN SOLN: 3.0000 mL | RESPIRATORY_TRACT | 1 refills | Status: DC | PRN

## 2016-03-06 MED ORDER — PREDNISONE 10 MG PO TABS: ORAL_TABLET | ORAL | 0 refills | Status: DC

## 2016-03-06 MED ORDER — IPRATROPIUM-ALBUTEROL 0.5-2.5 (3) MG/3ML IN SOLN: 3.0000 mL | Freq: Three times a day (TID) | RESPIRATORY_TRACT | 1 refills | Status: DC

## 2016-03-06 NOTE — Care Management Note (Signed)
Case Management Note  Patient Details  Name: Meredith [name] MRN: [id number] Date of Birth: 09-20-1940  Subjective/Objective:    Acute respiratory failure, Acute Encephalopathy, DM                Action/Plan: Discharge Planning: AVS reviewed:  NCM spoke to pt and dtr, Meredith [name] at bedside. Her son-in-law was at bedside also. Pt states she has Bipap, neb machine, RW and oxygen at home. She lives in home with dtr, Meredith [name]. States her Pulmonologist, Dr Keturah Barre sent order to [facility] this past week to check her Bipap at home. She is active with [facility] RN/aide. NCM contacted [place], Meredith [name] to make aware of resumption of care orders for [facility] and RT needed to check her Bipap at home. Contacted [facility] DME rep for portable oxygen tank to go home.   PCP-Kristen Deatra Ina PA   Expected Discharge Date:  03/06/2016               Expected Discharge Plan:  [place]  In-House Referral:  NA  Discharge planning Services  CM Consult  Post Acute Care Choice:  Home Health, Resumption of Svcs/PTA Provider Choice offered to:  Patient  DME Arranged:  N/A DME Agency:  NA  HH Arranged:  RN, aide, Respiratory Therapy HH Agency:  [place]  Status of Service:  Completed, signed off  If discussed at [place] of Stay Meetings, dates discussed:    Additional Comments:  Erenest Rasher, RN 03/06/2016, 11:05 AM

## 2016-03-06 NOTE — Progress Notes (Signed)
[facility] delivered oxygen. Patient given discharge instructions and new prescriptions. Says she understands all instructions. Daughter present for discharge instructions. Discharged home with daughter.

## 2016-03-06 NOTE — Discharge Instructions (Signed)
Chronic Obstructive Pulmonary Disease Chronic obstructive pulmonary disease (COPD) is a common lung condition in which airflow from the lungs is limited. COPD is a general term that can be used to describe many different lung problems that limit airflow, including both chronic bronchitis and emphysema. If you have COPD, your lung function will probably never return to normal, but there are measures you can take to improve lung function and make yourself feel better. CAUSES   Smoking (common).  Exposure to secondhand smoke.  Genetic problems.  Chronic inflammatory lung diseases or recurrent infections. SYMPTOMS  Shortness of breath, especially with physical activity.  Deep, persistent (chronic) cough with a large amount of thick mucus.  Wheezing.  Rapid breaths (tachypnea).  Gray or bluish discoloration (cyanosis) of the skin, especially in your fingers, toes, or lips.  Fatigue.  Weight loss.  Frequent infections or episodes when breathing symptoms become much worse (exacerbations).  Chest tightness. DIAGNOSIS Your health care provider will take a medical history and perform a physical examination to diagnose COPD. Additional tests for COPD may include:  Lung (pulmonary) function tests.  Chest X-ray.  CT scan.  Blood tests. TREATMENT  Treatment for COPD may include:  Inhaler and nebulizer medicines. These help manage the symptoms of COPD and make your breathing more comfortable.  Supplemental oxygen. Supplemental oxygen is only helpful if you have a low oxygen level in your blood.  Exercise and physical activity. These are beneficial for nearly all people with COPD.  Lung surgery or transplant.  Nutrition therapy to gain weight, if you are underweight.  Pulmonary rehabilitation. This may involve working with a team of health care providers and specialists, such as respiratory, occupational, and physical therapists. HOME CARE INSTRUCTIONS  Take all medicines  (inhaled or pills) as directed by your health care provider.  Avoid over-the-counter medicines or cough syrups that dry up your airway (such as antihistamines) and slow down the elimination of secretions unless instructed otherwise by your health care provider.  If you are a smoker, the most important thing that you can do is stop smoking. Continuing to smoke will cause further lung damage and breathing trouble. Ask your health care provider for help with quitting smoking. He or she can direct you to community resources or hospitals that provide support.  Avoid exposure to irritants such as smoke, chemicals, and fumes that aggravate your breathing.  Use oxygen therapy and pulmonary rehabilitation if directed by your health care provider. If you require home oxygen therapy, ask your health care provider whether you should purchase a pulse oximeter to measure your oxygen level at home.  Avoid contact with individuals who have a contagious illness.  Avoid extreme temperature and humidity changes.  Eat healthy foods. Eating smaller, more frequent meals and resting before meals may help you maintain your strength.  Stay active, but balance activity with periods of rest. Exercise and physical activity will help you maintain your ability to do things you want to do.  Preventing infection and hospitalization is very important when you have COPD. Make sure to receive all the vaccines your health care provider recommends, especially the pneumococcal and influenza vaccines. Ask your health care provider whether you need a pneumonia vaccine.  Learn and use relaxation techniques to manage stress.  Learn and use controlled breathing techniques as directed by your health care provider. Controlled breathing techniques include:  Pursed lip breathing. Start by breathing in (inhaling) through your nose for 1 second. Then, purse your lips as if you were   going to whistle and breathe out (exhale) through the  pursed lips for 2 seconds.  Diaphragmatic breathing. Start by putting one hand on your abdomen just above your waist. Inhale slowly through your nose. The hand on your abdomen should move out. Then purse your lips and exhale slowly. You should be able to feel the hand on your abdomen moving in as you exhale.  Learn and use controlled coughing to clear mucus from your lungs. Controlled coughing is a series of short, progressive coughs. The steps of controlled coughing are: 1. Lean your head slightly forward. 2. Breathe in deeply using diaphragmatic breathing. 3. Try to hold your breath for 3 seconds. 4. Keep your mouth slightly open while coughing twice. 5. Spit any mucus out into a tissue. 6. Rest and repeat the steps once or twice as needed. SEEK MEDICAL CARE IF:  You are coughing up more mucus than usual.  There is a change in the color or thickness of your mucus.  Your breathing is more labored than usual.  Your breathing is faster than usual. SEEK IMMEDIATE MEDICAL CARE IF:  You have shortness of breath while you are resting.  You have shortness of breath that prevents you from:  Being able to talk.  Performing your usual physical activities.  You have chest pain lasting longer than 5 minutes.  Your skin color is more cyanotic than usual.  You measure low oxygen saturations for longer than 5 minutes with a pulse oximeter. MAKE SURE YOU:  Understand these instructions.  Will watch your condition.  Will get help right away if you are not doing well or get worse.   This information is not intended to replace advice given to you by your health care provider. Make sure you discuss any questions you have with your health care provider.   Document Released: 03/17/2005 Document Revised: 06/28/2014 Document Reviewed: 02/01/2013 Elsevier Interactive Patient Education 2016 Elsevier Inc.  

## 2016-03-06 NOTE — Discharge Summary (Signed)
Physician Discharge Summary  Meredith [name] [id number] DOB: 1941-01-03 DOA: 03/04/2016  PCP: Tula Nakayama  Admit date: 03/04/2016 Discharge date: 03/06/2016  Recommendations for Outpatient Follow-up:  1. Pt will need to follow up with PCP in 2-3 weeks post discharge 2. Please note that pt confirmed her code status DNR and insists on going home today, aware that is still not at her baseline, she says she knows she can get worse and opts to come back if needed but for now asking to respect her wishes  3. 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 Pt's daughters at bedside and provide history as pt is on BiPAP and somnolent. Daughters explain pt has end stage lung disease and know this is another flare up of her COPD. They are clear that mother does not want to be intubated, DNR status confirmed. Family OK with palliative medicine team to assist with comfort care as well.   In ED, pt in resp distress with accessory muscles use, RR in 30's, currently on BiPAP, ABG notable for pH 7.294, pCO2 86, pO2 65. Admit to SDU. Continue BD's, solumedrol, PCT consulted.   Discharge Diagnoses: Acute on chronic respiratory failure with hypoxia and hypercapnia - Secondary to COPD exacerbation - has been off BiPAP since this AM and currently on [place] looking better  - continue BD's  scheduled and as needed - PCT consulted for further GOC discussion, assistance is appreciated  - pt wants to go home, will respect wishes, advised to complete prednisone taper   Acute encephalopathy secondary to AECOPD - more clear, alert and awake, wants to go home   Diabetes mellitus type 2 - 02/05/16--Hemoglobin A1c--6.5 - resume home medical regimen   Depression/anxiety - Continue alprazolam when necessary  DVT prophylaxis: Lovenox SQ Code Status: DNR Family Communication: Patient and daughters at bedside  Disposition Plan: Home  Consultants:   PCT  Procedures:   None  Antimicrobials:   None  Procedures/Studies:   CXR: 03/04/2016  No active cardiopulmonary disease.  Discharge Exam: Vitals:   03/06/16 0730 03/06/16 0800  BP: (!) 166/93   Pulse: 93 94  Resp: (!) 21 11  Temp:  97.9 F (36.6 C)   Vitals:   03/06/16 0435 03/06/16 0730 03/06/16 0800 03/06/16 0815  BP: (!) 112/34 (!) 166/93    Pulse: (!) 57 93 94   Resp: 13 (!) 21 11   Temp:   97.9 F (36.6 C)   TempSrc:   Oral   SpO2: 97% 94% 93% 95%  Weight:      Height:        General: Pt is alert, follows commands appropriately, not in acute distress Cardiovascular: Regular rate and rhythm, S1/S2 +, no murmurs, no rubs, no gallops Respiratory: still course breath sounds but better air movement compared to yesterday, minimal exp wheezing  Abdominal: Soft, non tender, non distended, bowel sounds +, no guarding  Discharge Instructions  Discharge Instructions    Diet - low sodium  heart healthy    Complete by:  As directed    Increase activity slowly    Complete by:  As directed        Medication List    TAKE these medications   albuterol 108 (90 Base) MCG/ACT inhaler Commonly known as:  PROVENTIL HFA;VENTOLIN HFA Inhale 2 puffs into the lungs every 6 (six) hours as needed for wheezing or shortness of breath. What changed:  Another medication with the same name was removed. Continue  taking this medication, and follow the directions you see here.   ALPRAZolam 1 MG tablet Commonly known as:  XANAX Take 1 mg by mouth 3 (three) times daily as needed for anxiety or sleep. Take 1/2 to 1 tablet by mouth three times daily as needed   CENTRUM SILVER ADULT 50+ Tabs Take 1 tablet by mouth daily.   glipiZIDE 2.5 MG 24 hr tablet Commonly known as:  GLUCOTROL XL Take 2.5 mg by mouth daily.   guaiFENesin 600 MG 12 hr tablet Commonly known as:  MUCINEX Take 600 mg by mouth 2 (two) times daily.   ipratropium-albuterol 0.5-2.5 (3) MG/3ML Soln Commonly known as:  DUONEB Take 3 mLs by nebulization every 2 (two) hours as needed.   ipratropium-albuterol 0.5-2.5 (3) MG/3ML Soln Commonly known as:  DUONEB Take 3 mLs by nebulization 3 (three) times daily.   naproxen sodium 220 MG tablet Commonly known as:  ANAPROX Take 220 mg by mouth 2 (two) times daily as needed (pain).   nystatin 100000 UNIT/ML suspension Commonly known as:  MYCOSTATIN TAKE 5MLS BY MOUTH 4 TIMES A DAY What changed:  how much to take  how to take this  when to take this  reasons to take this  additional instructions   omeprazole 40 MG capsule Commonly known as:  PRILOSEC Take 1 capsule (40 mg total) by mouth daily.   predniSONE 10 MG tablet Commonly known as:  DELTASONE Take 60 mg tablet 03/07/2016 and taper down by 10 mg daily until you get to 10 mg tablet and then continue taking 10 mg tablet daily. What changed:  how much to take  how to take this  when to take this  additional instructions   SYMBICORT 160-4.5 MCG/ACT inhaler Generic drug:  budesonide-formoterol USE 2 PUFFS TWICE DAILY   triamcinolone ointment 0.1 % Commonly known as:  KENALOG Apply 1 application topically daily as needed (for itching).   vitamin C 500 MG tablet Commonly known as:  ASCORBIC ACID Take 1,000 mg by mouth daily.       Follow-up Information    KAPLAN,KRISTEN, PA-C .   Specialty:  Family  Medicine Contact information: [address] [place] [address] [phone]          The results of significant diagnostics from this hospitalization (including imaging, microbiology, ancillary and laboratory) are listed below for reference.     Microbiology: No results found for this or any previous visit (from the past 240 hour(s)).   Labs: Basic Metabolic Panel:  Recent Labs Lab 03/04/16 1342 03/05/16 0349 03/06/16 0350  NA 140 139 138  K 4.9 4.7 4.2  CL 94* 93* 95*  CO2 41* 39* 36*  GLUCOSE 252* 254* 241*  BUN 16 17 25*  CREATININE 0.88 0.87 0.96  CALCIUM 8.3* 8.7* 9.0   CBC:  Recent Labs Lab 03/04/16 1342 03/05/16 0349 03/06/16 0350  WBC 9.1 7.8 9.2  NEUTROABS 7.8*  --   --   HGB 10.3* 10.4* 10.1*  HCT  34.2* 34.8* 32.5*  MCV 96.6 96.9 94.2  PLT 214 245 272    BNP (last 3 results)  Recent Labs  02/03/16 1549 03/04/16 1342  BNP 140.0* 112.4*   CBG:  Recent Labs Lab 03/05/16 0800 03/05/16 1225 03/05/16 1651 03/05/16 2129 03/06/16 0722  GLUCAP 249* 293* 269* 212* 202*   SIGNED: Time coordinating discharge: 30 minutes  MAGICK-Florina Glas, MD  [facility] 03/06/2016, 9:31 AM Pager [phone]  If 7PM-7AM, please contact night-coverage [email or web address] Password [email or web address]

## 2016-03-09 ENCOUNTER — Telehealth: Payer: Self-pay | Admitting: Internal Medicine

## 2016-03-09 NOTE — Telephone Encounter (Signed)
Pt called questioning how often she should be taking the Duoneb. Pt was recently d/c from hospital and it has Duoneb listed to take TID and also to take q2h prn.   Dr. Annamaria Boots please advise how often pt should be taking Duoneb. Thanks  Allergies  Allergen Reactions  . Morphine     Made high strung and have hallucination   . Tramadol Other (See Comments)    Makes her mean  . Zoloft [Sertraline Hcl] Other (See Comments)    Made her cry a lot   Current Outpatient Prescriptions on File Prior to Visit  Medication Sig Dispense Refill  . albuterol (PROVENTIL HFA;VENTOLIN HFA) 108 (90 Base) MCG/ACT inhaler Inhale 2 puffs into the lungs every 6 (six) hours as needed for wheezing or shortness of breath. 1 Inhaler 5  . ALPRAZolam (XANAX) 1 MG tablet Take 1 mg by mouth 3 (three) times daily as needed for anxiety or sleep. Take 1/2 to 1 tablet by mouth three times daily as needed    . glipiZIDE (GLUCOTROL XL) 2.5 MG 24 hr tablet Take 2.5 mg by mouth daily.      Marland Kitchen guaiFENesin (MUCINEX) 600 MG 12 hr tablet Take 600 mg by mouth 2 (two) times daily.     Marland Kitchen ipratropium-albuterol (DUONEB) 0.5-2.5 (3) MG/3ML SOLN Take 3 mLs by nebulization every 2 (two) hours as needed. 500 mL 1  . ipratropium-albuterol (DUONEB) 0.5-2.5 (3) MG/3ML SOLN Take 3 mLs by nebulization 3 (three) times daily. 500 mL 1  . Multiple Vitamins-Minerals (CENTRUM SILVER ADULT 50+) TABS Take 1 tablet by mouth daily.    . naproxen sodium (ANAPROX) 220 MG tablet Take 220 mg by mouth 2 (two) times daily as needed (pain).    . nystatin (MYCOSTATIN) 100000 UNIT/ML suspension TAKE 5MLS BY MOUTH 4 TIMES A DAY (Patient taking differently: Use as directed 5 mLs in the mouth or throat 2 (two) times daily as needed (for thrush). ) 360 mL 0  . omeprazole (PRILOSEC) 40 MG capsule Take 1 capsule (40 mg total) by mouth daily. 30 capsule 0  . predniSONE (DELTASONE) 10 MG tablet Take 60 mg tablet 03/07/2016 and taper down by 10 mg daily until you get to 10 mg  tablet and then continue taking 10 mg tablet daily. 42 tablet 0  . SYMBICORT 160-4.5 MCG/ACT inhaler USE 2 PUFFS TWICE DAILY  1  . triamcinolone ointment (KENALOG) 0.1 % Apply 1 application topically daily as needed (for itching).     . vitamin C (ASCORBIC ACID) 500 MG tablet Take 1,000 mg by mouth daily.      No current facility-administered medications on file prior to visit.

## 2016-03-09 NOTE — Telephone Encounter (Signed)
Spoke with pt. She is aware of CY's recommendation. Nothing further was needed. 

## 2016-03-09 NOTE — Telephone Encounter (Signed)
Every 4-6 hours as needed

## 2016-03-12 ENCOUNTER — Telehealth: Payer: Self-pay | Admitting: Internal Medicine

## 2016-03-12 DIAGNOSIS — J9611 Chronic respiratory failure with hypoxia: Secondary | ICD-10-CM

## 2016-03-12 NOTE — Telephone Encounter (Signed)
Ok to replace old VPAP machine at current settings (please document what those are for our record), continuing mask of choice, humidifier, supplies, AirView and continuing O2     For dx chronic respiratory failure with hypoxia

## 2016-03-12 NOTE — Telephone Encounter (Signed)
Melissa is calling requesting an order for replacement CPAP. Please advise Dr. Annamaria Boots thanks

## 2016-03-12 NOTE — Telephone Encounter (Signed)
Orders placed, spoke with Melissa at [facility] to ensure that order was placed appropriately.   Nothing further needed.

## 2016-03-18 ENCOUNTER — Telehealth: Payer: Self-pay | Admitting: Internal Medicine

## 2016-03-18 DIAGNOSIS — J449 Chronic obstructive pulmonary disease, unspecified: Secondary | ICD-10-CM

## 2016-03-18 DIAGNOSIS — J9611 Chronic respiratory failure with hypoxia: Secondary | ICD-10-CM

## 2016-03-18 NOTE — Telephone Encounter (Signed)
Called spoke with [facility] w/ [employer]. Reports when pt was originally given BIPAP back in 2012, she did not qualify. She is unsure exactly how pt got this BIPAP, other than she was just given this by [facility].   [employer] is trying to get this approved by pt's insurance. With pt diagnosis of COPD, pt will need the following done: 1) ABG (pt has already done this and [facility] has this already) 2) ONO on prescribed liter flow of O2 w/o BIPAP 3) Dr. Annamaria Boots will need to addend his last OV note stating OSA has been ruled out.  Please advise Dr. Annamaria Boots thanks

## 2016-03-19 NOTE — Telephone Encounter (Signed)
CY please advise  

## 2016-03-19 NOTE — Telephone Encounter (Signed)
[phone] pt calling back

## 2016-03-19 NOTE — Telephone Encounter (Signed)
Called # provided below and was advised I had the wrong #. forwarding original message back to Dr. Annamaria Boots

## 2016-03-19 NOTE — Telephone Encounter (Signed)
Order- ONOX on O2 3.5l for sleep, WITHOUT BIPAP that night   For dx hypoxic respiratory failure, COPD mixed type

## 2016-03-19 NOTE — Telephone Encounter (Signed)
ATC pt at the home #, line rang numerous times, WCB Will speak to pt, prior to ordering.

## 2016-03-22 NOTE — Telephone Encounter (Signed)
Patient returning our call - pr  °

## 2016-03-22 NOTE — Telephone Encounter (Signed)
Attempted to call pt back. Line rang several times with no answer. Will try back.

## 2016-03-22 NOTE — Telephone Encounter (Signed)
lmtcb x1 for pt. 

## 2016-03-24 NOTE — Telephone Encounter (Signed)
Pt returning call [phone]

## 2016-03-24 NOTE — Telephone Encounter (Signed)
Attempted to contact pt. No answer, voicemail is full. Will try back. 

## 2016-03-24 NOTE — Telephone Encounter (Signed)
Spoke with pt. She is aware of ONO order. Order has been placed. Nothing further was needed.

## 2016-03-25 ENCOUNTER — Telehealth: Payer: Self-pay | Admitting: Internal Medicine

## 2016-03-25 NOTE — Telephone Encounter (Signed)
Spoke with pt and informed that we sent order for [place] on 03/24/16 and it sometimes take a few days for them to get the insurance approved and they will be contacting her .  Pt verbalized understanding.

## 2016-04-01 ENCOUNTER — Encounter: Payer: Self-pay | Admitting: Internal Medicine

## 2016-04-06 ENCOUNTER — Telehealth: Payer: Self-pay | Admitting: Internal Medicine

## 2016-04-06 DIAGNOSIS — J9611 Chronic respiratory failure with hypoxia: Secondary | ICD-10-CM

## 2016-04-06 NOTE — Telephone Encounter (Signed)
Patient calling back - she can be reached at [phone]

## 2016-04-06 NOTE — Telephone Encounter (Signed)
Called and spoke with [place] from [facility] and he stated that they received the ONO today and this was given to [place].  He will call Melissa to make sure that this is brought in to CY.    I called the pt and advised her that once CY receives the ONO and reviews these results, we will let her know what is going on and what needs to be ordered. She is aware and I will forward this to CY.

## 2016-04-07 NOTE — Telephone Encounter (Signed)
ONOX done 04/01/16 on O2 3.5 L without BIPAP shows good and stable oxygenation. There is no respiratory pattern to suggest apnea events. She has not had a hx of OSA. Therefore, recommend we d/c order to [facility] for replacement of her old BIPAP machine. I can't dmonstrate that she needs it, unless [facility] wants different documentation to qualify based on respiratory failure.

## 2016-04-07 NOTE — Telephone Encounter (Signed)
Patient aware of Dr. Janee Morn recommendations. Order entered to D/C BiPAP. Nothing further needed.

## 2016-04-12 ENCOUNTER — Encounter: Payer: Self-pay | Admitting: Internal Medicine

## 2016-04-12 ENCOUNTER — Ambulatory Visit (INDEPENDENT_AMBULATORY_CARE_PROVIDER_SITE_OTHER): Payer: Medicare HMO | Admitting: Internal Medicine

## 2016-04-12 VITALS — BP 122/78 | HR 87 | Ht 66.0 in | Wt 200.0 lb

## 2016-04-12 DIAGNOSIS — J9612 Chronic respiratory failure with hypercapnia: Secondary | ICD-10-CM | POA: Diagnosis not present

## 2016-04-12 DIAGNOSIS — J449 Chronic obstructive pulmonary disease, unspecified: Secondary | ICD-10-CM | POA: Diagnosis not present

## 2016-04-12 DIAGNOSIS — J9611 Chronic respiratory failure with hypoxia: Secondary | ICD-10-CM | POA: Diagnosis not present

## 2016-04-12 MED ORDER — GLYCOPYRROLATE-FORMOTEROL 9-4.8 MCG/ACT IN AERO: 2.0000 | INHALATION_SPRAY | Freq: Two times a day (BID) | RESPIRATORY_TRACT | 12 refills | Status: AC

## 2016-04-12 NOTE — Assessment & Plan Note (Signed)
We don't have documented OSA. She can't tell that BiPAP makes any difference and she may not need it. We will reduce oxygen at night to maintain respiratory drive and recheck oximetry. Plan-02 reduced from 3.5-3 L for sleep. Okay to use 3-5 L as needed during activity as discussed, while awake.

## 2016-04-12 NOTE — Patient Instructions (Addendum)
Try setting O2 at night on 3L/ min for sleep Ok to set O2 at 3-5L as needed with exertion/  [facility]  Sample x 2 Bevespi maintenance inhaler     Inhale 2 puffs, twice daily    Try this instead of Symbicort.  If you like it better you can get the script filled. Call us if needed.  Please call if we can help.  Order [facility]   Overnight oximetry on O2 3l/ min    Dx COPD mixed type, chronic respiratory failure with hypoxemia

## 2016-04-12 NOTE — Assessment & Plan Note (Signed)
Severe COPD. She has chosen DO NOT RESUSCITATE/DO NOT INTUBATE status which is appropriate We discussed current medications Plan-try replacing Symbicort with Bevespi maintenance inhaler

## 2016-04-12 NOTE — Progress Notes (Signed)
Subjective:    Patient ID: Meredith [name], female    DOB: [birth year], 75 y.o.   MRN: [id number]  HPI 75 yo female former smoker with severe COPD on o2 at 3-4 l/m on BIPAP At bedtime  .  On chronic steroids for year with prednisone 5mg  daily .   TEST  PFT- 03/14/2006 showed an FEV1 at 32%, ratio 39, FVC 60%, positive bronchodilator response  07/14/2015-75 year old female former smoker followed for COPD, chronic respiratory failure hypoxia, OHS/BiPAP, complicated by history colon cancer, DM BIPAP 10/5 used w/ O2 3.5L-4LAdvanced  FOLLOWS FOR: Pt contiuues to use O2 through [facility] as well as BiPAP. Pt states she needs [facility] to help her with mask and machine fit. Temazepam not helping patient any longer-would like to discuss getting Ambien rx.  She asks for home health nurse assistance and we discussed short-term versus long-term care needs. She resists the idea of leaving her home but admits she has significant trouble getting around and making her meals. I asked her to talk frankly with family and her primary physician about long-term care needs. She takes Xanax for anxiety.  04/12/2016-75 year old female former smoker followed for COPD, chronic respiratory failure hypoxia, OHS/BiPAP, complicated by history: CAD, DM      DNR/ DNI confirmed at hosp 03/06/16 ONOX- 04/02/16- done on O2 3.5 L without BiPAP-normal oxygenation with lowest saturation looking like artifact at the beginning. Mean oxygen saturation 95%. CXR 03/04/2016-NAD Since last hospital she had been using BiPAP for sleep with O2/ Advanced LOV-NP-hospitalized in August and again in September for COPD exacerbation with acute respiratory failure, hypercapnea.. In ED 03/04/16-, pt in resp distress with accessory muscles use, RR in 30's, currently on BiPAP, ABG notable for pH 7.294, pCO2 86, pO2 65. Admit to SDU. Continue BD's, solumedrol, PCT consulted. Maintenance prednisone 5-10 mg daily, continuing Symbicort, Atrovent nebulizer 4 times daily.  Vaccines up-to-date. FOLLOWS FOR: DME: [facility]. Pt states DME took BiPAP machine away after having ONO and needs to know if she needs to use it or not-unsure why they took. She still has her machine but stopped using it as directed. Doesn't feel any different.   04/12/2016   Past Medical History:  Diagnosis Date  . Anxiety   . Chronic respiratory failure with hypoxia and hypercapnia (HCC)    Nocturnal BiPAP / 4 L/m at rest & 5 L/m with exertion  . Colon cancer ([place])    colon cancer  . COPD, very severe ([place])   . DM type 2 (diabetes mellitus, type 2) (HCC)    steroid induced  . Insomnia    Current Outpatient Prescriptions on File Prior to Visit  Medication Sig Dispense Refill  . albuterol (PROVENTIL HFA;VENTOLIN HFA) 108 (90 Base) MCG/ACT inhaler Inhale 2 puffs into the lungs every 6 (six) hours as needed for wheezing or shortness of breath. 1 Inhaler 5  . ALPRAZolam (XANAX) 1 MG tablet Take 1 mg by mouth 3 (three) times daily as needed for anxiety or sleep. Take 1/2 to 1 tablet by mouth three times daily as needed    . glipiZIDE (GLUCOTROL XL) 2.5 MG 24 hr tablet Take 2.5 mg by mouth daily.      Marland Kitchen guaiFENesin (MUCINEX) 600 MG 12 hr tablet Take 600 mg by mouth 2 (two) times daily.     Marland Kitchen ipratropium-albuterol (DUONEB) 0.5-2.5 (3) MG/3ML SOLN Take 3 mLs by nebulization every 2 (two) hours as needed. 500 mL 1  . ipratropium-albuterol (DUONEB) 0.5-2.5 (3) MG/3ML SOLN Take 3  mLs by nebulization 3 (three) times daily. 500 mL 1  . Multiple Vitamins-Minerals (CENTRUM SILVER ADULT 50+) TABS Take 1 tablet by mouth daily.    . naproxen sodium (ANAPROX) 220 MG tablet Take 220 mg by mouth 2 (two) times daily as needed (pain).    . nystatin (MYCOSTATIN) 100000 UNIT/ML suspension TAKE 5MLS BY MOUTH 4 TIMES A DAY (Patient taking differently: Use as directed 5 mLs in the mouth or throat 2 (two) times daily as needed (for thrush). ) 360 mL 0  . omeprazole (PRILOSEC) 40 MG capsule Take 1 capsule (40 mg total) by  mouth daily. 30 capsule 0  . predniSONE (DELTASONE) 10 MG tablet Take 60 mg tablet 03/07/2016 and taper down by 10 mg daily until you get to 10 mg tablet and then continue taking 10 mg tablet daily. 42 tablet 0  . SYMBICORT 160-4.5 MCG/ACT inhaler USE 2 PUFFS TWICE DAILY  1  . triamcinolone ointment (KENALOG) 0.1 % Apply 1 application topically daily as needed (for itching).     . vitamin C (ASCORBIC ACID) 500 MG tablet Take 1,000 mg by mouth daily.      No current facility-administered medications on file prior to visit.     Review of Systems ROS-see HPI   Negative unless "+" Constitutional:    weight loss, night sweats, fevers, chills, fatigue, lassitude. HEENT:    headaches, difficulty swallowing, tooth/dental problems, sore throat,       sneezing, itching, ear ache, nasal congestion, post nasal drip, snoring CV:    chest pain, orthopnea, PND, swelling in lower extremities, anasarca,                                                    dizziness, palpitations Resp:   + shortness of breath with exertion or at rest.                productive cough,   non-productive cough, coughing up of blood.              change in color of mucus.  wheezing.   Skin:    rash or lesions. GI:  No-   heartburn, indigestion, abdominal pain, nausea, vomiting, diarrhea,                 change in bowel habits, loss of appetite GU: dysuria, change in color of urine, no urgency or frequency.   flank pain. MS:   joint pain, stiffness, decreased range of motion, back pain. Neuro-     nothing unusual Psych:  change in mood or affect.  depression or anxiety.   memory loss.           Objective:   Physical Exam There were no vitals filed for this visit.  04/12/2016   OBJ- Physical Exam General- Alert, Oriented, Affect-appropriate, Distress- none acute, + wheelchair, + oh to 3.5 L tank-sat 92% Skin- rash-none, lesions- none, excoriation- none Lymphadenopathy- none Head- atraumatic            Eyes- Gross  vision intact, PERRLA, conjunctivae and secretions clear            Ears- Hearing, canals-normal            Nose- Clear, no-Septal dev, mucus, polyps, erosion, perforation             Throat- Mallampati  II , mucosa clear , drainage- none, tonsils- atrophic Neck- flexible , trachea midline, no stridor , thyroid nl, carotid no bruit Chest - symmetrical excursion , unlabored           Heart/CV- RRR , no murmur , no gallop  , no rub, nl s1 s2                           - JVD- none , edema- none, stasis changes- none, varices- none           Lung- clear to P&A, wheeze- none, cough- none , dullness-none, rub- none           Chest wall-  Abd-  Br/ Gen/ Rectal- Not done, not indicated Extrem- cyanosis- none, clubbing, none, atrophy- none, strength- nl Neuro- grossly intact to observation

## 2016-04-12 NOTE — Assessment & Plan Note (Deleted)
Severe COPD. She has chosen DO NOT RESUSCITATE/DO NOT INTUBATE status which is appropriate We discussed current medications Plan-try replacing Symbicort with Bevespi maintenance inhaler

## 2016-04-16 ENCOUNTER — Telehealth: Payer: Self-pay | Admitting: Internal Medicine

## 2016-04-16 DIAGNOSIS — J9612 Chronic respiratory failure with hypercapnia: Secondary | ICD-10-CM

## 2016-04-16 DIAGNOSIS — J9621 Acute and chronic respiratory failure with hypoxia: Secondary | ICD-10-CM

## 2016-04-16 NOTE — Telephone Encounter (Signed)
ATC, NA and mailbox is full

## 2016-04-19 ENCOUNTER — Other Ambulatory Visit: Payer: Self-pay | Admitting: Internal Medicine

## 2016-04-19 DIAGNOSIS — J9612 Chronic respiratory failure with hypercapnia: Secondary | ICD-10-CM

## 2016-04-19 DIAGNOSIS — J9611 Chronic respiratory failure with hypoxia: Secondary | ICD-10-CM

## 2016-04-19 NOTE — Telephone Encounter (Signed)
Pt states that she had an ONO on 10/12, and wants to know why CY is wanting her to have another one so soon.   Pt also notes that [facility] has not yet reached out to her regarding her bipap or her ONO.  (will await to hear CY's recs before calling [facility] to check status of order)  CY please advise if pt needs another ONO as discussed at last office visit.  Thanks

## 2016-04-19 NOTE — Telephone Encounter (Signed)
We were trying to document that she still got enough oxygen after we reduced her from 3.5 to 3.0 l/m O2 for sleep as of last visit.

## 2016-04-19 NOTE — Telephone Encounter (Signed)
Called and spoke with pt and she is aware of CY recs.  ono order has been placed. Nothing further is needed.

## 2016-04-24 ENCOUNTER — Other Ambulatory Visit: Payer: Self-pay | Admitting: Internal Medicine

## 2016-04-29 NOTE — Telephone Encounter (Signed)
Please check what prednisone pill strength she has. We will continue daily prednisone 10 mg, s refill either:  Prednisone 5 mg, # 60, 2 daily, ref x 5  Or  Prednisone 10 mg, # 30, 1 daily, ref x 5

## 2016-04-29 NOTE — Telephone Encounter (Signed)
CY Please advise on refill; Appears Dr Doyle Askew changed Rx for patient on 02-25-16. Thanks.

## 2016-05-05 ENCOUNTER — Telehealth: Payer: Self-pay | Admitting: Internal Medicine

## 2016-05-05 NOTE — Telephone Encounter (Signed)
ATC, mailbox full wcb 

## 2016-05-06 NOTE — Telephone Encounter (Signed)
Lm with family to have the pt call back

## 2016-05-06 NOTE — Telephone Encounter (Signed)
ATC x 1, mailbox full, unable to leave voicemail wcb

## 2016-05-07 NOTE — Telephone Encounter (Signed)
Attempted to contact pt. No answer and her voicemail was full. Will try back.

## 2016-05-07 NOTE — Telephone Encounter (Signed)
Yes this should come through her primary care prrovider.

## 2016-05-07 NOTE — Telephone Encounter (Signed)
Pt requesting a new walker with seat, as her current walker is broken. Pt would like this sent to [facility]. I made pt aware that CY may want her to go to her PCP for this.  CY please advise. Thanks.

## 2016-05-10 ENCOUNTER — Telehealth: Payer: Self-pay | Admitting: Internal Medicine

## 2016-05-10 MED ORDER — PREDNISONE 5 MG PO TABS: 5.0000 mg | ORAL_TABLET | Freq: Every day | ORAL | 0 refills | Status: AC

## 2016-05-10 NOTE — Telephone Encounter (Signed)
Atc, no answer, mailbox full X3. Will close per triage protocol.

## 2016-05-10 NOTE — Telephone Encounter (Signed)
Not a drug of abuse, so ok to send a one month script this time to fill the gap. She should check first to see if a family member might have picked it up for her.

## 2016-05-10 NOTE — Telephone Encounter (Signed)
Called and spoke with pt and she is aware of rx that has been sent to the pharmacy.  

## 2016-05-10 NOTE — Telephone Encounter (Signed)
Called and spoke with pt and she stated that she did not have any refills on the prednisone.  I advised her that the rx for this was sent in on 11/9.  Called and spoke with the pharmacy and they stated that this rx was picked up on 11/14 and it has 5 refills.  I called the pt back to make her aware of this and she stated that she does not have any prednisone.  CY please advise.  Thanks  Allergies  Allergen Reactions  . Morphine     Made high strung and have hallucination   . Tramadol Other (See Comments)    Makes her mean  . Zoloft [Sertraline Hcl] Other (See Comments)    Made her cry a lot

## 2016-06-07 ENCOUNTER — Encounter (HOSPITAL_COMMUNITY): Payer: Self-pay | Admitting: Internal Medicine

## 2016-06-07 ENCOUNTER — Inpatient Hospital Stay (HOSPITAL_COMMUNITY)
Admission: EM | Admit: 2016-06-07 | Discharge: 2016-06-21 | DRG: 208 | Disposition: E | Payer: Medicare HMO | Attending: Internal Medicine | Admitting: Internal Medicine

## 2016-06-07 ENCOUNTER — Emergency Department (HOSPITAL_COMMUNITY): Payer: Medicare HMO

## 2016-06-07 DIAGNOSIS — Z888 Allergy status to other drugs, medicaments and biological substances status: Secondary | ICD-10-CM

## 2016-06-07 DIAGNOSIS — I469 Cardiac arrest, cause unspecified: Secondary | ICD-10-CM | POA: Diagnosis not present

## 2016-06-07 DIAGNOSIS — Z885 Allergy status to narcotic agent status: Secondary | ICD-10-CM | POA: Diagnosis not present

## 2016-06-07 DIAGNOSIS — M6281 Muscle weakness (generalized): Secondary | ICD-10-CM

## 2016-06-07 DIAGNOSIS — R32 Unspecified urinary incontinence: Secondary | ICD-10-CM | POA: Diagnosis present

## 2016-06-07 DIAGNOSIS — Z7951 Long term (current) use of inhaled steroids: Secondary | ICD-10-CM | POA: Diagnosis not present

## 2016-06-07 DIAGNOSIS — F329 Major depressive disorder, single episode, unspecified: Secondary | ICD-10-CM | POA: Diagnosis present

## 2016-06-07 DIAGNOSIS — Z9181 History of falling: Secondary | ICD-10-CM | POA: Diagnosis not present

## 2016-06-07 DIAGNOSIS — J441 Chronic obstructive pulmonary disease with (acute) exacerbation: Secondary | ICD-10-CM | POA: Diagnosis present

## 2016-06-07 DIAGNOSIS — F32A Depression, unspecified: Secondary | ICD-10-CM | POA: Diagnosis present

## 2016-06-07 DIAGNOSIS — G931 Anoxic brain damage, not elsewhere classified: Secondary | ICD-10-CM | POA: Diagnosis not present

## 2016-06-07 DIAGNOSIS — E099 Drug or chemical induced diabetes mellitus without complications: Secondary | ICD-10-CM | POA: Diagnosis present

## 2016-06-07 DIAGNOSIS — D638 Anemia in other chronic diseases classified elsewhere: Secondary | ICD-10-CM | POA: Diagnosis present

## 2016-06-07 DIAGNOSIS — Z85038 Personal history of other malignant neoplasm of large intestine: Secondary | ICD-10-CM | POA: Diagnosis not present

## 2016-06-07 DIAGNOSIS — J9622 Acute and chronic respiratory failure with hypercapnia: Secondary | ICD-10-CM | POA: Diagnosis present

## 2016-06-07 DIAGNOSIS — Z7984 Long term (current) use of oral hypoglycemic drugs: Secondary | ICD-10-CM | POA: Diagnosis not present

## 2016-06-07 DIAGNOSIS — R296 Repeated falls: Secondary | ICD-10-CM | POA: Diagnosis present

## 2016-06-07 DIAGNOSIS — W19XXXA Unspecified fall, initial encounter: Secondary | ICD-10-CM | POA: Diagnosis present

## 2016-06-07 DIAGNOSIS — T380X5A Adverse effect of glucocorticoids and synthetic analogues, initial encounter: Secondary | ICD-10-CM | POA: Diagnosis present

## 2016-06-07 DIAGNOSIS — R579 Shock, unspecified: Secondary | ICD-10-CM | POA: Diagnosis not present

## 2016-06-07 DIAGNOSIS — E875 Hyperkalemia: Secondary | ICD-10-CM | POA: Diagnosis not present

## 2016-06-07 DIAGNOSIS — E118 Type 2 diabetes mellitus with unspecified complications: Secondary | ICD-10-CM | POA: Diagnosis present

## 2016-06-07 DIAGNOSIS — J9621 Acute and chronic respiratory failure with hypoxia: Secondary | ICD-10-CM | POA: Diagnosis present

## 2016-06-07 DIAGNOSIS — G4733 Obstructive sleep apnea (adult) (pediatric): Secondary | ICD-10-CM | POA: Diagnosis present

## 2016-06-07 DIAGNOSIS — Z515 Encounter for palliative care: Secondary | ICD-10-CM | POA: Diagnosis present

## 2016-06-07 DIAGNOSIS — J449 Chronic obstructive pulmonary disease, unspecified: Secondary | ICD-10-CM | POA: Diagnosis present

## 2016-06-07 DIAGNOSIS — Z87891 Personal history of nicotine dependence: Secondary | ICD-10-CM

## 2016-06-07 DIAGNOSIS — Z7189 Other specified counseling: Secondary | ICD-10-CM | POA: Diagnosis not present

## 2016-06-07 DIAGNOSIS — Z452 Encounter for adjustment and management of vascular access device: Secondary | ICD-10-CM

## 2016-06-07 DIAGNOSIS — Z9981 Dependence on supplemental oxygen: Secondary | ICD-10-CM | POA: Diagnosis not present

## 2016-06-07 DIAGNOSIS — Z79899 Other long term (current) drug therapy: Secondary | ICD-10-CM

## 2016-06-07 DIAGNOSIS — R569 Unspecified convulsions: Secondary | ICD-10-CM | POA: Diagnosis not present

## 2016-06-07 DIAGNOSIS — N179 Acute kidney failure, unspecified: Secondary | ICD-10-CM | POA: Diagnosis not present

## 2016-06-07 DIAGNOSIS — Z7952 Long term (current) use of systemic steroids: Secondary | ICD-10-CM

## 2016-06-07 DIAGNOSIS — J9690 Respiratory failure, unspecified, unspecified whether with hypoxia or hypercapnia: Secondary | ICD-10-CM

## 2016-06-07 DIAGNOSIS — Z66 Do not resuscitate: Secondary | ICD-10-CM | POA: Diagnosis present

## 2016-06-07 DIAGNOSIS — R092 Respiratory arrest: Secondary | ICD-10-CM | POA: Diagnosis not present

## 2016-06-07 LAB — COMPREHENSIVE METABOLIC PANEL
ALBUMIN: 3.7 g/dL (ref 3.5–5.0)
ALT: 19 U/L (ref 14–54)
ANION GAP: 7 (ref 5–15)
AST: 25 U/L (ref 15–41)
Alkaline Phosphatase: 56 U/L (ref 38–126)
BILIRUBIN TOTAL: 0.6 mg/dL (ref 0.3–1.2)
BUN: 15 mg/dL (ref 6–20)
CO2: 38 mmol/L — ABNORMAL HIGH (ref 22–32)
Calcium: 8.9 mg/dL (ref 8.9–10.3)
Chloride: 93 mmol/L — ABNORMAL LOW (ref 101–111)
Creatinine, Ser: 0.94 mg/dL (ref 0.44–1.00)
GFR calc Af Amer: >60 mL/min (ref >60)
GFR, EST NON AFRICAN AMERICAN: 59 mL/min — AB (ref >60)
GLUCOSE: 150 mg/dL — AB (ref 65–99)
POTASSIUM: 4.3 mmol/L (ref 3.5–5.1)
Sodium: 138 mmol/L (ref 135–145)
TOTAL PROTEIN: 6.6 g/dL (ref 6.5–8.1)

## 2016-06-07 LAB — CBC WITH DIFFERENTIAL/PLATELET
BASOS PCT: 1 %
Basophils Absolute: 0 10*3/uL (ref 0.0–0.1)
EOS PCT: 2 %
Eosinophils Absolute: 0.2 10*3/uL (ref 0.0–0.7)
HEMATOCRIT: 40.6 % (ref 36.0–46.0)
Hemoglobin: 11.9 g/dL — ABNORMAL LOW (ref 12.0–15.0)
Lymphocytes Relative: 11 %
Lymphs Abs: 0.9 10*3/uL (ref 0.7–4.0)
MCH: 29.5 pg (ref 26.0–34.0)
MCHC: 29.3 g/dL — AB (ref 30.0–36.0)
MCV: 100.5 fL — ABNORMAL HIGH (ref 78.0–100.0)
MONO ABS: 0.8 10*3/uL (ref 0.1–1.0)
MONOS PCT: 9 %
NEUTROS ABS: 6.3 10*3/uL (ref 1.7–7.7)
Neutrophils Relative %: 77 %
PLATELETS: 153 10*3/uL (ref 150–400)
RBC: 4.04 MIL/uL (ref 3.87–5.11)
RDW: 13.3 % (ref 11.5–15.5)
WBC: 8.2 10*3/uL (ref 4.0–10.5)

## 2016-06-07 LAB — URINALYSIS, ROUTINE W REFLEX MICROSCOPIC
BILIRUBIN URINE: NEGATIVE
Glucose, UA: NEGATIVE mg/dL
HGB URINE DIPSTICK: NEGATIVE
Leukocytes, UA: NEGATIVE
Nitrite: NEGATIVE
PROTEIN: NEGATIVE mg/dL
Specific Gravity, Urine: >1.03 — ABNORMAL HIGH (ref 1.005–1.030)
pH: 6 (ref 5.0–8.0)

## 2016-06-07 LAB — BRAIN NATRIURETIC PEPTIDE: B NATRIURETIC PEPTIDE 5: 98 pg/mL (ref 0.0–100.0)

## 2016-06-07 LAB — I-STAT TROPONIN, ED: Troponin i, poc: 0.01 ng/mL (ref 0.00–0.08)

## 2016-06-07 LAB — GLUCOSE, CAPILLARY: GLUCOSE-CAPILLARY: 245 mg/dL — AB (ref 65–99)

## 2016-06-07 MED ORDER — BUPROPION HCL ER (SR) 150 MG PO TB12
150.0000 mg | ORAL_TABLET | Freq: Every day | ORAL | Status: DC
  Administered 2016-06-08 – 2016-06-09 (×2): 150 mg via ORAL
  Filled 2016-06-07 (×6): qty 1

## 2016-06-07 MED ORDER — NYSTATIN 100000 UNIT/ML MT SUSP
5.0000 mL | Freq: Two times a day (BID) | OROMUCOSAL | Status: DC | PRN
  Administered 2016-06-07: 500000 [IU] via OROMUCOSAL
  Filled 2016-06-07: qty 5

## 2016-06-07 MED ORDER — SERTRALINE HCL 50 MG PO TABS
100.0000 mg | ORAL_TABLET | Freq: Every day | ORAL | Status: DC
  Administered 2016-06-07 – 2016-06-09 (×3): 100 mg via ORAL
  Filled 2016-06-07 (×4): qty 2

## 2016-06-07 MED ORDER — ALBUTEROL SULFATE (2.5 MG/3ML) 0.083% IN NEBU
2.5000 mg | INHALATION_SOLUTION | RESPIRATORY_TRACT | Status: DC | PRN
  Administered 2016-06-08: 2.5 mg via RESPIRATORY_TRACT
  Filled 2016-06-07: qty 3

## 2016-06-07 MED ORDER — DOCUSATE SODIUM 100 MG PO CAPS
100.0000 mg | ORAL_CAPSULE | Freq: Two times a day (BID) | ORAL | Status: DC
  Administered 2016-06-08 – 2016-06-09 (×3): 100 mg via ORAL
  Filled 2016-06-07 (×4): qty 1

## 2016-06-07 MED ORDER — ONDANSETRON HCL 4 MG/2ML IJ SOLN: 4.0000 mg | Freq: Four times a day (QID) | INTRAMUSCULAR | Status: DC | PRN

## 2016-06-07 MED ORDER — GUAIFENESIN ER 600 MG PO TB12
600.0000 mg | ORAL_TABLET | Freq: Two times a day (BID) | ORAL | Status: DC
  Administered 2016-06-07 – 2016-06-09 (×4): 600 mg via ORAL
  Filled 2016-06-07 (×4): qty 1

## 2016-06-07 MED ORDER — ENOXAPARIN SODIUM 40 MG/0.4ML ~~LOC~~ SOLN
40.0000 mg | SUBCUTANEOUS | Status: DC
  Administered 2016-06-07 – 2016-06-10 (×3): 40 mg via SUBCUTANEOUS
  Filled 2016-06-07 (×4): qty 0.4

## 2016-06-07 MED ORDER — METHYLPREDNISOLONE SODIUM SUCC 125 MG IJ SOLR
60.0000 mg | Freq: Two times a day (BID) | INTRAMUSCULAR | Status: DC
  Administered 2016-06-07 – 2016-06-09 (×4): 60 mg via INTRAVENOUS
  Filled 2016-06-07 (×4): qty 2

## 2016-06-07 MED ORDER — PANTOPRAZOLE SODIUM 40 MG PO TBEC
80.0000 mg | DELAYED_RELEASE_TABLET | Freq: Every day | ORAL | Status: DC
  Administered 2016-06-07 – 2016-06-09 (×3): 80 mg via ORAL
  Filled 2016-06-07 (×3): qty 2

## 2016-06-07 MED ORDER — ACETAMINOPHEN 650 MG RE SUPP: 650.0000 mg | Freq: Four times a day (QID) | RECTAL | Status: DC | PRN

## 2016-06-07 MED ORDER — IPRATROPIUM-ALBUTEROL 0.5-2.5 (3) MG/3ML IN SOLN
3.0000 mL | Freq: Four times a day (QID) | RESPIRATORY_TRACT | Status: DC
  Administered 2016-06-07 – 2016-06-09 (×7): 3 mL via RESPIRATORY_TRACT
  Filled 2016-06-07 (×8): qty 3

## 2016-06-07 MED ORDER — LORAZEPAM 2 MG/ML IJ SOLN
0.5000 mg | Freq: Once | INTRAMUSCULAR | Status: AC
  Administered 2016-06-07: 0.5 mg via INTRAVENOUS
  Filled 2016-06-07: qty 1

## 2016-06-07 MED ORDER — INSULIN ASPART 100 UNIT/ML ~~LOC~~ SOLN
0.0000 [IU] | Freq: Every day | SUBCUTANEOUS | Status: DC
  Administered 2016-06-07: 2 [IU] via SUBCUTANEOUS

## 2016-06-07 MED ORDER — ONDANSETRON HCL 4 MG PO TABS: 4.0000 mg | ORAL_TABLET | Freq: Four times a day (QID) | ORAL | Status: DC | PRN

## 2016-06-07 MED ORDER — INSULIN ASPART 100 UNIT/ML ~~LOC~~ SOLN
0.0000 [IU] | Freq: Three times a day (TID) | SUBCUTANEOUS | Status: DC
  Administered 2016-06-08: 8 [IU] via SUBCUTANEOUS
  Administered 2016-06-08 (×2): 5 [IU] via SUBCUTANEOUS
  Administered 2016-06-09: 2 [IU] via SUBCUTANEOUS
  Administered 2016-06-09: 3 [IU] via SUBCUTANEOUS

## 2016-06-07 MED ORDER — ALBUTEROL SULFATE (2.5 MG/3ML) 0.083% IN NEBU
2.5000 mg | INHALATION_SOLUTION | Freq: Once | RESPIRATORY_TRACT | Status: AC
  Administered 2016-06-07: 2.5 mg via RESPIRATORY_TRACT
  Filled 2016-06-07: qty 3

## 2016-06-07 MED ORDER — IPRATROPIUM-ALBUTEROL 0.5-2.5 (3) MG/3ML IN SOLN
3.0000 mL | Freq: Once | RESPIRATORY_TRACT | Status: AC
  Administered 2016-06-07: 3 mL via RESPIRATORY_TRACT
  Filled 2016-06-07: qty 3

## 2016-06-07 MED ORDER — ACETAMINOPHEN 325 MG PO TABS
650.0000 mg | ORAL_TABLET | Freq: Four times a day (QID) | ORAL | Status: DC | PRN
Start: 2016-06-07 — End: 2016-06-10
  Administered 2016-06-07 – 2016-06-10 (×5): 650 mg via ORAL
  Filled 2016-06-07 (×5): qty 2

## 2016-06-07 MED ORDER — ALPRAZOLAM 0.5 MG PO TABS
1.0000 mg | ORAL_TABLET | Freq: Three times a day (TID) | ORAL | Status: DC | PRN
  Administered 2016-06-07 – 2016-06-09 (×4): 1 mg via ORAL
  Filled 2016-06-07 (×5): qty 1

## 2016-06-07 MED ORDER — METHYLPREDNISOLONE SODIUM SUCC 125 MG IJ SOLR
125.0000 mg | Freq: Once | INTRAMUSCULAR | Status: AC
  Administered 2016-06-07: 125 mg via INTRAVENOUS
  Filled 2016-06-07: qty 2

## 2016-06-07 NOTE — ED Notes (Signed)
Pt taken to Xray.

## 2016-06-07 NOTE — ED Notes (Signed)
Meal tray ordered 

## 2016-06-07 NOTE — ED Notes (Signed)
Saturations now between 88-98% on 6L [place].  Pt is in no more distress than when she first came in. No pain.

## 2016-06-07 NOTE — ED Notes (Addendum)
ED Provider at bedside. 

## 2016-06-07 NOTE — ED Notes (Signed)
hospitalist in with pt

## 2016-06-07 NOTE — ED Provider Notes (Signed)
[facility] DEPT Provider Note   CSN: [id number] Arrival date & time: 06/12/2016  1235  By signing my name below, I, Judithe Modest, attest that this documentation has been prepared under the direction and in the presence of Milton Ferguson, MD. Electronically Signed: Judithe Modest, ER Scribe. 01/31/2016. 12:55 PM.  History   Chief Complaint Chief Complaint  Patient presents with  . Fall   Patient complains of shortness of breath. Patient has COPD and is on 4 L at home   The history is provided by the patient. No language interpreter was used.  Shortness of Breath  This is a recurrent problem. The problem occurs continuously.The current episode started 6 to 12 hours ago. The problem has not changed since onset.Pertinent negatives include no headaches, no chest pain and no abdominal pain. It is unknown what precipitated the problem. Risk factors include recent prolonged sitting. She has tried ipratropium inhalers for the symptoms. The treatment provided mild relief. She has had prior hospitalizations. She has had prior ED visits. She has had prior ICU admissions. Associated medical issues include COPD.    HPI Comments: Meredith [name] is a 75 y.o. female who presents to the Emergency Department complaining of SOB since yesterday. She denies fever, chills. She is also complaining of a fall yesterday. She does not take any blood thinners. She lives with her family. EMS gave her two nebulizer treatments during transport.   Past Medical History:  Diagnosis Date  . Anxiety   . Chronic respiratory failure with hypoxia and hypercapnia (HCC)    Nocturnal BiPAP / 4 L/m at rest & 5 L/m with exertion  . Colon cancer ([place])    colon cancer  . COPD, very severe ([place])   . DM type 2 (diabetes mellitus, type 2) (HCC)    steroid induced  . Insomnia     Patient Active Problem List   Diagnosis Date Noted  . Acute bronchitis   . Acute and chronic respiratory failure with hypoxia ([place])   .  Acute respiratory failure with hypoxia and hypercarbia ([place]) 02/04/2016  . Acute exacerbation of chronic obstructive pulmonary disease (COPD) ([place]) 02/03/2016  . Acute on chronic respiratory failure with hypoxia ([place]) 02/03/2016  . Obesity hypoventilation syndrome ([place]) 02/03/2016  . Diabetes mellitus type 2, uncontrolled ([place]) 02/03/2016  . Chronic hypercapnic respiratory failure ([place]) 02/03/2016  . Anemia, chronic disease 02/03/2016  . CAP (community acquired pneumonia) 02/03/2016  . SIRS (systemic inflammatory response syndrome) ([place]) 02/03/2016  . Diabetes mellitus with complication ([place])   . Depression 10/05/2011  . 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 NECK MASS 04/30/2010  . THRUSH 12/30/2009  . INSOMNIA 05/14/2009  . Chronic respiratory failure with hypoxia ([place]) 05/08/2008  . COLON CANCER 12/29/2007  . COPD mixed type ([place]) 12/29/2007    Past Surgical History:  Procedure Laterality Date  . COLON SURGERY    . Cyst removed from Neck    . FOOT SURGERY Right     OB History    No data available       Home Medications    Prior to Admission medications   Medication Sig Start Date End Date Taking? Authorizing Provider  albuterol (PROVENTIL HFA;VENTOLIN HFA) 108 (90 Base) MCG/ACT inhaler Inhale 2 puffs into the lungs every 6 (six) hours as needed for wheezing or shortness of breath. 09/11/15   Deneise Lever, MD  ALPRAZolam Duanne Moron) 1 MG tablet Take 1 mg by mouth 3 (three) times daily as needed for anxiety or sleep.  Take 1/2 to 1 tablet by mouth three times daily as needed    Historical Provider, MD  glipiZIDE (GLUCOTROL XL) 2.5 MG 24 hr tablet Take 2.5 mg by mouth daily.      Historical Provider, MD  Glycopyrrolate-Formoterol (BEVESPI AEROSPHERE) 9-4.8 MCG/ACT AERO Inhale 2 puffs into the lungs 2 (two) times daily. 04/12/16   Deneise Lever, MD  guaiFENesin (MUCINEX) 600 MG 12 hr tablet Take 600 mg by mouth 2 (two) times daily.     Historical Provider, MD  ipratropium-albuterol  (DUONEB) 0.5-2.5 (3) MG/3ML SOLN Take 3 mLs by nebulization every 2 (two) hours as needed. 03/06/16   Theodis Blaze, MD  ipratropium-albuterol (DUONEB) 0.5-2.5 (3) MG/3ML SOLN Take 3 mLs by nebulization 3 (three) times daily. 03/06/16   Theodis Blaze, MD  Multiple Vitamins-Minerals (CENTRUM SILVER ADULT 50+) TABS Take 1 tablet by mouth daily.    Historical Provider, MD  naproxen sodium (ANAPROX) 220 MG tablet Take 220 mg by mouth 2 (two) times daily as needed (pain).    Historical Provider, MD  nystatin (MYCOSTATIN) 100000 UNIT/ML suspension TAKE 5MLS BY MOUTH 4 TIMES A DAY Patient taking differently: Use as directed 5 mLs in the mouth or throat 2 (two) times daily as needed (for thrush).  01/15/15   Deneise Lever, MD  omeprazole (PRILOSEC) 40 MG capsule Take 1 capsule (40 mg total) by mouth daily. 02/09/16   Orson Eva, MD  predniSONE (DELTASONE) 10 MG tablet Take 60 mg tablet 03/07/2016 and taper down by 10 mg daily until you get to 10 mg tablet and then continue taking 10 mg tablet daily. 03/06/16   Theodis Blaze, MD  predniSONE (DELTASONE) 5 MG tablet Take 1-2 tablets (5-10 mg total) by mouth daily. 05/10/16   Deneise Lever, MD  SYMBICORT 160-4.5 MCG/ACT inhaler USE 2 PUFFS TWICE DAILY 07/04/15   Historical Provider, MD  triamcinolone ointment (KENALOG) 0.1 % Apply 1 application topically daily as needed (for itching).  12/01/15   Historical Provider, MD  vitamin C (ASCORBIC ACID) 500 MG tablet Take 1,000 mg by mouth daily.     Historical Provider, MD    Family History Family History  Problem Relation Age of Onset  . COPD Mother   . Heart attack Father   . COPD Sister   . COPD Brother   . COPD Sister     Social History Social History  Substance Use Topics  . Smoking status: Former Smoker    Packs/day: 2.00    Years: 40.00    Types: Cigarettes    Quit date: 06/21/2008  . Smokeless tobacco: Former Systems developer    Quit date: 09/03/2008  . Alcohol use No     Allergies   Morphine; Tramadol; and  Zoloft [sertraline hcl]   Review of Systems Review of Systems  Constitutional: Negative for appetite change and fatigue.  HENT: Negative for congestion, ear discharge and sinus pressure.   Eyes: Negative for discharge.  Respiratory: Positive for shortness of breath.   Cardiovascular: Negative for chest pain.  Gastrointestinal: Negative for abdominal pain and diarrhea.  Endocrine: Negative for heat intolerance.  Genitourinary: Negative for frequency and hematuria.  Musculoskeletal: Negative for back pain.  Skin: Positive for color change and wound.  Neurological: Negative for seizures and headaches.  Psychiatric/Behavioral: Negative for hallucinations.    Physical Exam Updated Vital Signs BP 134/60 (BP Location: Right Arm)   Pulse 89   Temp 98.1 F (36.7 C) (Oral)   Resp 20  Ht 5\' 6"  (1.676 m)   Wt 200 lb (90.7 kg)   SpO2 91%   BMI 32.28 kg/m   Physical Exam  Constitutional: She is oriented to person, place, and time. She appears well-developed and well-nourished. No distress.  HENT:  Head: Normocephalic and atraumatic.  Eyes: Conjunctivae and EOM are normal. Pupils are equal, round, and reactive to light. No scleral icterus.  Neck: Neck supple. No thyromegaly present.  Cardiovascular: Normal rate and regular rhythm.  Exam reveals no gallop and no friction rub.   No murmur heard. Pulmonary/Chest: Effort normal. No stridor. No respiratory distress. She has wheezes. She has no rales. She exhibits no tenderness.  Moderate wheezing bilaterally  Abdominal: Soft. She exhibits no distension. There is no tenderness. There is no rebound.  Musculoskeletal: Normal range of motion. She exhibits no edema.  Lymphadenopathy:    She has no cervical adenopathy.  Neurological: She is alert and oriented to person, place, and time. She exhibits normal muscle tone. Coordination normal.  Skin: Skin is warm and dry. No rash noted. She is not diaphoretic. No erythema.  Bruising to left upper  back and left lower anterior leg.   Psychiatric: She has a normal mood and affect. Her behavior is normal.  Nursing note and vitals reviewed.    ED Treatments / Results  Labs (all labs ordered are listed, but only abnormal results are displayed) Labs Reviewed - No data to display  EKG  EKG Interpretation None       Radiology No results found.  Procedures Procedures (including critical care time)  Medications Ordered in ED Medications - No data to display   Initial Impression / Assessment and Plan / ED Course  I have reviewed the triage vital signs and the nursing notes.  Pertinent labs & imaging results that were available during my care of the patient were reviewed by me and considered in my medical decision making (see chart for details).  Clinical Course    Patient with COPD exacerbation. She will be admitted   Final Clinical Impressions(s) / ED Diagnoses   Final diagnoses:  None    New Prescriptions New Prescriptions   No medications on file       The chart was scribed for me under my direct supervision.  I personally performed the history, physical, and medical decision making and all procedures in the evaluation of this patient.Milton Ferguson, MD 06/18/2016 [phone]

## 2016-06-07 NOTE — ED Triage Notes (Signed)
Pt stated she fell last night but refused to let EMS take her to the hospital. Pt fell out of bed this morning and called EMS. Skin tear to L leg and bruise to L upper back. MM wet. Pt on 4L [place] currently. Mild gurgling noted

## 2016-06-07 NOTE — ED Notes (Addendum)
Pt sat at 88% on 6L [place].  MD notified.vo for 5/0.5 duo neb tx. Read back and verified.

## 2016-06-07 NOTE — H&P (Signed)
History and Physical    Meredith [name] [id number] DOB: 11-22-1940 DOA: 05/29/2016  PCP: Tula Nakayama Consultants:  Young - pulmonology Patient coming from: home - lives with daughter, SIL, and their children; NOK: daughters, [phone]  Chief Complaint: falls  HPI: Meredith [name] is a 75 y.o. 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  Yesterday, she fell.  They called 911 at 0200 and the patient refused to come to the ER.  She fell again and they called 911 again and they finally talked her into coming to the hospital.  She has been somewhat confused, family is concerned she may have a UTI - strong urine smell, holds it for long periods of time to avoid having to exert herself.  Occasional incontinence.  Has COPD, already hospitalized twice this year for exacerbations.  This AM was 65%.  Uses 4L at home, on 7L here and 88-90%.  Has been intubated 2 times in the past.  No change in chronic cough.  Quit smoking at least 10 years ago.  Afebrile, 99 this AM.  BP was a bit high.   ED Course: Per Dr. Roderic Palau:  Patient with COPD exacerbation. She will be admitted  Review of Systems: As per HPI; otherwise 10 point review of systems reviewed and negative.   Ambulatory Status:  Ambulates with a walker  Past Medical History:  Diagnosis Date  . Anxiety   . Chronic respiratory failure with hypoxia and hypercapnia (HCC)    Nocturnal BiPAP / 4 L/m at rest & 5 L/m with exertion  . Colon cancer ([place]) 2002   colon cancer  . COPD, very severe ([place])   . DM type 2 (diabetes mellitus, type 2) (HCC)    steroid induced  . Insomnia     Past Surgical History:  Procedure Laterality Date  . COLON SURGERY    . Cyst removed from Neck    . FOOT SURGERY Right     Social History   Social History  .  Marital status: Widowed    Spouse name: N/A  . Number of children: N/A  . Years of education: N/A   Occupational History  . retired    Social History Main Topics  . Smoking status: Former Smoker    Packs/day: 2.00    Years: 40.00    Types: Cigarettes    Quit date: 06/21/2008  . Smokeless tobacco: Former Systems developer    Quit date: 09/03/2008  . Alcohol use No  . Drug use: No  . Sexual activity: Not Currently   Other Topics Concern  . Not on file   Social History Narrative   Has a home nurse that visits twice weekly. No pets. No bird or mold exposure. No recent travel.    Allergies  Allergen Reactions  . Morphine     Made high strung and have hallucination   . Tramadol Other (See Comments)    Makes her mean  . Zoloft [Sertraline Hcl] Other (See Comments)    Made her cry a lot    Family History  Problem Relation Age of Onset  . COPD Mother   . Heart attack Father   . COPD Sister   . COPD Brother   . COPD Sister     Prior to Admission medications   Medication Sig Start Date End Date Taking? Authorizing Provider  albuterol (ACCUNEB) 1.25 MG/3ML nebulizer solution Take 3 mLs by nebulization every 4 (four) hours as needed for shortness of breath. 05/24/16  Yes Historical Provider, MD  albuterol (PROVENTIL HFA;VENTOLIN HFA) 108 (90 Base) MCG/ACT inhaler Inhale 2 puffs into the lungs every 6 (six) hours as needed for wheezing or shortness of breath. 09/11/15  Yes Deneise Lever, MD  ALPRAZolam Duanne Moron) 1 MG tablet Take 1 mg by mouth 3 (three) times daily as needed for anxiety or sleep. Take 1/2 to 1 tablet by mouth three times daily as needed   Yes Historical Provider, MD  buPROPion (WELLBUTRIN SR) 150 MG 12 hr tablet Take 1 tablet by mouth daily. 04/29/16  Yes Historical Provider, MD  glipiZIDE (GLUCOTROL XL) 2.5 MG 24 hr tablet Take 2.5 mg by mouth daily.     Yes Historical Provider, MD  Glycopyrrolate-Formoterol (BEVESPI AEROSPHERE) 9-4.8 MCG/ACT AERO Inhale 2 puffs into the lungs 2  (two) times daily. 04/12/16  Yes Deneise Lever, MD  guaiFENesin (MUCINEX) 600 MG 12 hr tablet Take 600 mg by mouth 2 (two) times daily.    Yes Historical Provider, MD  KLOR-CON M20 20 MEQ tablet Take 1 tablet by mouth daily. 04/29/16  Yes Historical Provider, MD  Multiple Vitamins-Minerals (CENTRUM SILVER ADULT 50+) TABS Take 1 tablet by mouth daily.   Yes Historical Provider, MD  naproxen sodium (ANAPROX) 220 MG tablet Take 220 mg by mouth 2 (two) times daily as needed (pain).   Yes Historical Provider, MD  nystatin (MYCOSTATIN) 100000 UNIT/ML suspension TAKE 5MLS BY MOUTH 4 TIMES A DAY Patient taking differently: Use as directed 5 mLs in the mouth or throat 2 (two) times daily as needed (for thrush).  01/15/15  Yes Deneise Lever, MD  omeprazole (PRILOSEC) 40 MG capsule Take 1 capsule (40 mg total) by mouth daily. 02/09/16  Yes Orson Eva, MD  predniSONE (DELTASONE) 5 MG tablet Take 1-2 tablets (5-10 mg total) by mouth daily. 05/10/16  Yes Deneise Lever, MD  sertraline (ZOLOFT) 100 MG tablet Take 1 tablet by mouth daily. 04/29/16  Yes Historical Provider, MD  SYMBICORT 160-4.5 MCG/ACT inhaler USE 2 PUFFS TWICE DAILY 07/04/15  Yes Historical Provider, MD  triamcinolone ointment (KENALOG) 0.1 % Apply 1 application topically daily as needed (for itching).  12/01/15  Yes Historical Provider, MD  vitamin C (ASCORBIC ACID) 500 MG tablet Take 1,000 mg by mouth daily.    Yes Historical Provider, MD  predniSONE (DELTASONE) 10 MG tablet Take 60 mg tablet 03/07/2016 and taper down by 10 mg daily until you get to 10 mg tablet and then continue taking 10 mg tablet daily. Patient not taking: Reported on 06/18/2016 03/06/16   Theodis Blaze, MD    Physical Exam: Vitals:   05/27/2016 1645 06/09/2016 1710 06/09/2016 2023 05/27/2016 2055  BP: (!) 101/53 116/74  (!) 106/49  Pulse: 94 100  90  Resp: 20 (!) 21  20  Temp: 98.4 F (36.9 C)   98.2 F (36.8 C)  TempSrc: Oral Oral  Oral  SpO2: 91% 92% 91% 94%  Weight:  58 kg  (127 lb 12.8 oz)    Height:  5\' 6"  (1.676 m)       General:  Appears calm and comfortable and is NAD Eyes:  PERRL, EOMI, normal lids, iris ENT:  grossly normal hearing, lips & tongue, mmm Neck:  no LAD, masses or thyromegaly Cardiovascular:  RRR, no m/r/g. No LE edema.  Respiratory:  CTA bilaterally, no r/r, mild  diffuse wheezing. Normal respiratory effort with shallow respirations but has difficulty taking a deep breath. Abdomen: soft, ntnd, NABS Skin:  no rash or induration seen on limited exam Musculoskeletal:  grossly normal tone BUE/BLE, good ROM, no bony abnormality Psychiatric:  grossly normal mood and affect, speech fluent and appropriate, AOx3.  She occasionally demonstrates patterns of speech and forgetfulness that are concerning for dementia. Neurologic:  CN 2-12 grossly intact, moves all extremities in coordinated fashion, sensation intact  Labs on Admission: I have personally reviewed following labs and imaging studies  CBC:  Recent Labs Lab 06/16/2016 1311  WBC 8.2  NEUTROABS 6.3  HGB 11.9*  HCT 40.6  MCV 100.5*  PLT 0000000   Basic Metabolic Panel:  Recent Labs Lab 06/05/2016 1311  NA 138  K 4.3  CL 93*  CO2 38*  GLUCOSE 150*  BUN 15  CREATININE 0.94  CALCIUM 8.9   GFR: Estimated Creatinine Clearance: 48.8 mL/min (by C-G formula based on SCr of 0.94 mg/dL). Liver Function Tests:  Recent Labs Lab 06/06/2016 1311  AST 25  ALT 19  ALKPHOS 56  BILITOT 0.6  PROT 6.6  ALBUMIN 3.7   No results for input(s): LIPASE, AMYLASE in the last 168 hours. No results for input(s): AMMONIA in the last 168 hours. Coagulation Profile: No results for input(s): INR, PROTIME in the last 168 hours. Cardiac Enzymes: No results for input(s): CKTOTAL, CKMB, CKMBINDEX, TROPONINI in the last 168 hours. BNP (last 3 results) No results for input(s): PROBNP in the last 8760 hours. HbA1C: No results for input(s): HGBA1C in the last 72 hours. CBG:  Recent Labs Lab  05/27/2016 2057  GLUCAP 245*   Lipid Profile: No results for input(s): CHOL, HDL, LDLCALC, TRIG, CHOLHDL, LDLDIRECT in the last 72 hours. Thyroid Function Tests: No results for input(s): TSH, T4TOTAL, FREET4, T3FREE, THYROIDAB in the last 72 hours. Anemia Panel: No results for input(s): VITAMINB12, FOLATE, FERRITIN, TIBC, IRON, RETICCTPCT in the last 72 hours. Urine analysis:    Component Value Date/Time   COLORURINE YELLOW 06/20/2016 1552   APPEARANCEUR CLEAR 05/26/2016 1552   LABSPEC >1.030 (H) 05/27/2016 1552   PHURINE 6.0 06/09/2016 1552   GLUCOSEU NEGATIVE 06/19/2016 1552   HGBUR NEGATIVE 06/08/2016 1552   BILIRUBINUR NEGATIVE 06/09/2016 1552   KETONESUR TRACE (A) 05/31/2016 1552   PROTEINUR NEGATIVE 06/02/2016 1552   UROBILINOGEN 0.2 02/07/2011 1953   NITRITE NEGATIVE 06/15/2016 1552   LEUKOCYTESUR NEGATIVE 05/28/2016 1552    Creatinine Clearance: Estimated Creatinine Clearance: 48.8 mL/min (by C-G formula based on SCr of 0.94 mg/dL).  Sepsis Labs: @LABRCNTIP (procalcitonin:4,lacticidven:4) )No results found for this or any previous visit (from the past 240 hour(s)).   Radiological Exams on Admission: Dg Chest 2 View  Result Date: 06/06/2016 CLINICAL DATA:  Fall. EXAM: CHEST  2 VIEW COMPARISON:  03/04/2016. FINDINGS: Mediastinum and hilar structures normal. Low lung volumes with mild basilar atelectasis. COPD. Bilateral pleuroparenchymal thickening noted consistent with scarring. Heart size normal. No acute bony abnormality. IMPRESSION: 1. Low lung volumes with mild basilar atelectasis. COPD. Bilateral pleuroparenchymal thickening noted consistent with scarring. 2.  Heart size normal. Electronically Signed   By: Marcello Moores  Register   On: 06/15/2016 13:43   Dg Tibia/fibula Left  Result Date: 05/30/2016 CLINICAL DATA:  Pt states she fell but unsure when/abrasion and swelling anterior aspect left lower leg EXAM: LEFT TIBIA AND FIBULA - 2 VIEW COMPARISON:  None. FINDINGS: There  is no evidence of fracture or other focal bone lesions. Popliteal arterial calcifications noted.  Soft tissues are unremarkable. IMPRESSION: No acute findings. Electronically Signed   By: Lucrezia Europe M.D.   On: 05/24/2016 13:42    EKG: Independently reviewed.  NSR with rate 90; LAFB with no evidence of acute ischemia  Assessment/Plan Principal Problem:   Acute and chronic respiratory failure with hypoxia (HCC) Active Problems:   Depression   Anemia, chronic disease   Diabetes mellitus with complication (HCC)   COPD (chronic obstructive pulmonary disease) (HCC)   Falls   Acute on chronic respiratory failure resulting from COPD exacerbation -Patient's shortness of breath and occasionally productive cough are most likely caused by acute COPD exacerbation.  -She has history of severe COPD on 4L home O2 -She also has a h/o 2 prior intubations.  -Other differential diagnoses include, but less likely, pulmonary embolism (low suspicion), pneumonia (she does not have fever or leukocytosis and chest x-ray is not consistent with pneumonia), CHF (patient's previous 2-D echo showed normal EF in 8/17 and BNP is normal today). -Patient did not receive antibiotics in the ER and these will not be given now since the patient does not have sputum volume/purulence changes to suggest need for antibiotics by the cardinal symptoms. -will admit patient to med surg  -Nebulizers: scheduled Duoneb and prn albuterol -Solu-Medrol 60 mg IV BID   -Mucinex for cough  -Pulmonary, RT, PT/OT, CM consults  Falls -Family was concerned about UTI as cause -UA is reassuring -Instead, it seems more likely that the patient is very deconditioned (she rarely gets up unless she really has to according to family) and may be trying to be more independent than she should -COPD is likely exacerbating this issue -Will request PT/OT consults  DM -A1c was 6.5 on 02/05/16 -hold Glucotrol -Cover with SSI  Anemia -Hgb 11.9 -Above  usual baseline in the 10-11 range  Depression -patient with underlying depression, taking Wellbutrin, Xanax, and Zoloft -Despite these medications, patient reports uncontrolled depression -Additionally, based on my observations today and reluctant family agreement, I am also concerned about developing dementia -Will request psychiatry consult to address these issues   DVT prophylaxis:  Lovenox Code Status: Full - confirmed with patient/family Family Communication: 3 daughter present throughout evaluation Disposition Plan:  Home once clinically improved Consults called: Pulm, PT/OT,  Nutrition, Psychiatry, RT Admission status: Admit - It is my clinical opinion that admission to INPATIENT is reasonable and necessary because this patient will require at least 2 midnights in the hospital to treat this condition based on the medical complexity of the problems presented.  Given the aforementioned information, the predictability of an adverse outcome is felt to be significant.    Karmen Bongo MD [facility]  If 7PM-7AM, please contact night-coverage [email or web address] Password [facility]  05/31/2016, 9:28 PM

## 2016-06-07 NOTE — ED Notes (Signed)
Floor unable to take report at this time.

## 2016-06-08 LAB — BASIC METABOLIC PANEL
ANION GAP: 6 (ref 5–15)
BUN: 17 mg/dL (ref 6–20)
CHLORIDE: 90 mmol/L — AB (ref 101–111)
CO2: 43 mmol/L — ABNORMAL HIGH (ref 22–32)
Calcium: 9.2 mg/dL (ref 8.9–10.3)
Creatinine, Ser: 0.94 mg/dL (ref 0.44–1.00)
GFR calc Af Amer: >60 mL/min (ref >60)
GFR calc non Af Amer: 59 mL/min — ABNORMAL LOW (ref >60)
GLUCOSE: 224 mg/dL — AB (ref 65–99)
POTASSIUM: 4.4 mmol/L (ref 3.5–5.1)
Sodium: 139 mmol/L (ref 135–145)

## 2016-06-08 LAB — GLUCOSE, CAPILLARY
GLUCOSE-CAPILLARY: 218 mg/dL — AB (ref 65–99)
Glucose-Capillary: 254 mg/dL — ABNORMAL HIGH (ref 65–99)
Glucose-Capillary: 240 mg/dL — ABNORMAL HIGH (ref 65–99)
Glucose-Capillary: 205 mg/dL — ABNORMAL HIGH (ref 65–99)

## 2016-06-08 LAB — CBC
HEMATOCRIT: 39.4 % (ref 36.0–46.0)
HEMOGLOBIN: 11.9 g/dL — AB (ref 12.0–15.0)
MCH: 29.8 pg (ref 26.0–34.0)
MCHC: 30.2 g/dL (ref 30.0–36.0)
MCV: 98.7 fL (ref 78.0–100.0)
Platelets: 177 10*3/uL (ref 150–400)
RBC: 3.99 MIL/uL (ref 3.87–5.11)
RDW: 12.9 % (ref 11.5–15.5)
WBC: 5.8 10*3/uL (ref 4.0–10.5)

## 2016-06-08 MED ORDER — METOCLOPRAMIDE HCL 5 MG/ML IJ SOLN
10.0000 mg | Freq: Once | INTRAMUSCULAR | Status: AC
  Administered 2016-06-08: 10 mg via INTRAVENOUS
  Filled 2016-06-08: qty 2

## 2016-06-08 MED ORDER — KETOROLAC TROMETHAMINE 15 MG/ML IJ SOLN
15.0000 mg | Freq: Once | INTRAMUSCULAR | Status: AC
  Administered 2016-06-08: 15 mg via INTRAVENOUS
  Filled 2016-06-08: qty 1

## 2016-06-08 MED ORDER — ENSURE ENLIVE PO LIQD
237.0000 mL | ORAL | Status: DC
  Administered 2016-06-08: 237 mL via ORAL

## 2016-06-08 MED ORDER — DIPHENHYDRAMINE HCL 50 MG/ML IJ SOLN
25.0000 mg | Freq: Once | INTRAMUSCULAR | Status: AC
  Administered 2016-06-08: 25 mg via INTRAVENOUS
  Filled 2016-06-08: qty 1

## 2016-06-08 NOTE — Progress Notes (Signed)
Initial Nutrition Assessment  DOCUMENTATION CODES:  Obesity unspecified  INTERVENTION:  Ensure Enlive po q24 hrs, each supplement provides 350 kcal and 20 grams of protein  Would recommend obtaining re-weight  NUTRITION DIAGNOSIS:  Increased nutrient needs related to chronic illness (COPD ON 4L) as evidenced by estimated needs.  GOAL:  Patient will meet greater than or equal to 90% of their needs  MONITOR:  PO intake, Labs, I & O's, Supplement acceptance  REASON FOR ASSESSMENT:  Consult COPD Protocol  ASSESSMENT:  75 y/o female PMHx COPD (on 4 L baseline), Chronic Respiratory failure, OSA, depression/anxiety, Steroid induced DM. Presents after falling at home. Worked up and admitted for COPD exacerbation and possible UTI.  Pt is excitable and did not offer much in way of history. It sounds like she has been eating alright. She does not think her current wt is accurate at all. Could not obtain bed weight as she was in a chair speaking with family. She denies any recent changes in appetite or weight. Did not ask specific UBW for privacy reasons.   Pt reports that she has had not trouble eating. She is seen with 100% eaten Lunch tray in front of her. She says occasionally her tongue hurts (due to thrush), but "I have medicine for that". She doesn't seem at all concerned about her appetite or weight and frankly seemed surprised I was even seeing her.   She has had Boost/ensure at home and was agreeable to to a supplement for extra protein.   Labs: a1C on 8/17: 6.5, BG >200.  Medications: Colace, ppi, methylprednisolone, Nystatin   Recent Labs Lab 06/02/2016 1311 06/08/16 0604  NA 138 139  K 4.3 4.4  CL 93* 90*  CO2 38* 43*  BUN 15 17  CREATININE 0.94 0.94  CALCIUM 8.9 9.2  GLUCOSE 150* 224*    Diet Order:  Diet Carb Modified Fluid consistency: Thin; Room service appropriate? Yes  Skin: Abrasion to Left Leg  Last BM:  12/17  Height:  Ht Readings from Last 1  Encounters:  05/27/2016 5\' 6"  (1.676 m)   Weight:  Wt Readings from Last 1 Encounters:  06/02/2016 127 lb 12.8 oz (58 kg)   Wt Readings from Last 10 Encounters:  06/15/2016 127 lb 12.8 oz (58 kg)  04/12/16 200 lb (90.7 kg)  03/04/16 200 lb (90.7 kg)  02/17/16 200 lb (90.7 kg)  02/07/16 221 lb 12.5 oz (100.6 kg)  06/05/12 215 lb 12.8 oz (97.9 kg)  09/30/11 203 lb 12.8 oz (92.4 kg)  05/28/11 203 lb (92.1 kg)  03/15/11 199 lb (90.3 kg)  02/05/11 192 lb (87.1 kg)   Ideal Body Weight:  59.1 kg  BMI:  Body mass index is 20.63 kg/m. Current wt is misweight- using older weight, is obese  Estimated Nutritional Needs:  Kcal:  1700-1900 Protein:  80-95 g Pro Fluid:  1.7-1.9 L fluid  EDUCATION NEEDS:  No education needs identified at this time  Burtis Junes RD, LDN, [place] [facility] Pager: [phone] 06/08/2016 2:11 PM

## 2016-06-08 NOTE — Consult Note (Signed)
Consult requested by:Dr. Regaldo Consult requested for acute on chronic respiratory failure:  HPI: This is a 75 year old who came to the emergency department after falling and she has been confused as well. There may have been a urinary tract infection because of a strong urine smell. She was markedly hypoxic when she came to the emergency department. She has history of chronic hypoxic respiratory failure on home oxygen for several years. She has severe COPD. She has a history of sleep apnea. She has had to be intubated and placed on mechanical ventilation at least twice. She says she feels better. Her breathing is improving. She initially was sleepy but is better with that now. Not having significant cough. She says she has had dysuria. She's not had any chest pain. No nausea vomiting diarrhea. No hemoptysis. She has not been having any sputum production. No headache  Past Medical History:  Diagnosis Date  . Anxiety   . Chronic respiratory failure with hypoxia and hypercapnia (HCC)    Nocturnal BiPAP / 4 L/m at rest & 5 L/m with exertion  . Colon cancer ([place]) 2002   colon cancer  . COPD, very severe ([place])   . DM type 2 (diabetes mellitus, type 2) (HCC)    steroid induced  . Insomnia      Family History  Problem Relation Age of Onset  . COPD Mother   . Heart attack Father   . COPD Sister   . COPD Brother   . COPD Sister      Social History   Social History  . Marital status: Widowed    Spouse name: N/A  . Number of children: N/A  . Years of education: N/A   Occupational History  . retired    Social History Main Topics  . Smoking status: Former Smoker    Packs/day: 2.00    Years: 40.00    Types: Cigarettes    Quit date: 06/21/2008  . Smokeless tobacco: Former Systems developer    Quit date: 09/03/2008  . Alcohol use No  . Drug use: No  . Sexual activity: Not Currently   Other Topics Concern  . None   Social History Narrative   Has a home nurse that visits twice weekly. No pets.  No bird or mold exposure. No recent travel.     ROS: Except as mentioned above 10 point review of systems is negative    Objective: Vital signs in last 24 hours: Temp:  [97.7 F (36.5 C)-98.4 F (36.9 C)] 97.7 F (36.5 C) (12/19 0635) Pulse Rate:  [83-100] 83 (12/19 0635) Resp:  [12-23] 20 (12/19 0635) BP: (101-138)/(49-74) 137/64 (12/19 0635) SpO2:  [86 %-100 %] 88 % (12/19 0827) Weight:  [58 kg (127 lb 12.8 oz)-90.7 kg (200 lb)] 58 kg (127 lb 12.8 oz) (12/18 1710) Weight change:  Last BM Date: 06/06/16  Intake/Output from previous day: 12/18 0701 - 12/19 0700 In: 240 [P.O.:240] Out: 400 [Urine:400]  PHYSICAL EXAM Constitutional: She is awake and alert but sleepy. She is wearing nasal oxygen. Eyes: Pupils are reactive. EOMI. Ears nose mouth and throat: Mucous membranes are moist. Her hearing is grossly normal. Cardiovascular: Her heart is regular with normal heart sounds. No edema. Respiratory: Her respiratory effort is mildly increased. Lungs show prolonged expiratory phase and somewhat diminished breath sounds. Gastrointestinal: Her abdomen is soft without masses. Skin: Warm and dry neurological: She has been somewhat confused and is mildly confused this morning.  Lab Results: Basic Metabolic Panel:  Recent Labs  05/29/2016 1311 06/08/16 0604  NA 138 139  K 4.3 4.4  CL 93* 90*  CO2 38* 43*  GLUCOSE 150* 224*  BUN 15 17  CREATININE 0.94 0.94  CALCIUM 8.9 9.2   Liver Function Tests:  Recent Labs  06/17/2016 1311  AST 25  ALT 19  ALKPHOS 56  BILITOT 0.6  PROT 6.6  ALBUMIN 3.7   No results for input(s): LIPASE, AMYLASE in the last 72 hours. No results for input(s): AMMONIA in the last 72 hours. CBC:  Recent Labs  06/12/2016 1311 06/08/16 0604  WBC 8.2 5.8  NEUTROABS 6.3  --   HGB 11.9* 11.9*  HCT 40.6 39.4  MCV 100.5* 98.7  PLT 153 177   Cardiac Enzymes: No results for input(s): CKTOTAL, CKMB, CKMBINDEX, TROPONINI in the last 72 hours. BNP: No  results for input(s): PROBNP in the last 72 hours. D-Dimer: No results for input(s): DDIMER in the last 72 hours. CBG:  Recent Labs  06/16/2016 2057 06/08/16 0752  GLUCAP 245* 218*   Hemoglobin A1C: No results for input(s): HGBA1C in the last 72 hours. Fasting Lipid Panel: No results for input(s): CHOL, HDL, LDLCALC, TRIG, CHOLHDL, LDLDIRECT in the last 72 hours. Thyroid Function Tests: No results for input(s): TSH, T4TOTAL, FREET4, T3FREE, THYROIDAB in the last 72 hours. Anemia Panel: No results for input(s): VITAMINB12, FOLATE, FERRITIN, TIBC, IRON, RETICCTPCT in the last 72 hours. Coagulation: No results for input(s): LABPROT, INR in the last 72 hours. Urine Drug Screen: Drugs of Abuse  No results found for: LABOPIA, COCAINSCRNUR, LABBENZ, AMPHETMU, THCU, LABBARB  Alcohol Level: No results for input(s): ETH in the last 72 hours. Urinalysis:  Recent Labs  05/25/2016 1552  COLORURINE YELLOW  LABSPEC >1.030*  PHURINE 6.0  GLUCOSEU NEGATIVE  HGBUR NEGATIVE  BILIRUBINUR NEGATIVE  KETONESUR TRACE*  PROTEINUR NEGATIVE  NITRITE NEGATIVE  LEUKOCYTESUR NEGATIVE   Misc. Labs:   ABGS: No results for input(s): PHART, PO2ART, TCO2, HCO3 in the last 72 hours.  Invalid input(s): PCO2   MICROBIOLOGY: No results found for this or any previous visit (from the past 240 hour(s)).  Studies/Results: Dg Chest 2 View  Result Date: 06/18/2016 CLINICAL DATA:  Fall. EXAM: CHEST  2 VIEW COMPARISON:  03/04/2016. FINDINGS: Mediastinum and hilar structures normal. Low lung volumes with mild basilar atelectasis. COPD. Bilateral pleuroparenchymal thickening noted consistent with scarring. Heart size normal. No acute bony abnormality. IMPRESSION: 1. Low lung volumes with mild basilar atelectasis. COPD. Bilateral pleuroparenchymal thickening noted consistent with scarring. 2.  Heart size normal. Electronically Signed   By: Marcello Moores  Register   On: 05/30/2016 13:43   Dg Tibia/fibula  Left  Result Date: 05/22/2016 CLINICAL DATA:  Pt states she fell but unsure when/abrasion and swelling anterior aspect left lower leg EXAM: LEFT TIBIA AND FIBULA - 2 VIEW COMPARISON:  None. FINDINGS: There is no evidence of fracture or other focal bone lesions. Popliteal arterial calcifications noted. Soft tissues are unremarkable. IMPRESSION: No acute findings. Electronically Signed   By: Lucrezia Europe M.D.   On: 06/06/2016 13:42    Medications:  Prior to Admission:  Prescriptions Prior to Admission  Medication Sig Dispense Refill Last Dose  . albuterol (ACCUNEB) 1.25 MG/3ML nebulizer solution Take 3 mLs by nebulization every 4 (four) hours as needed for shortness of breath.   06/11/2016 at Unknown time  . albuterol (PROVENTIL HFA;VENTOLIN HFA) 108 (90 Base) MCG/ACT inhaler Inhale 2 puffs into the lungs every 6 (six) hours as needed for wheezing or shortness of  breath. 1 Inhaler 5 unknown  . ALPRAZolam (XANAX) 1 MG tablet Take 1 mg by mouth 3 (three) times daily as needed for anxiety or sleep. Take 1/2 to 1 tablet by mouth three times daily as needed   06/06/2016 at Unknown time  . buPROPion (WELLBUTRIN SR) 150 MG 12 hr tablet Take 1 tablet by mouth daily.  5 06/06/2016 at Unknown time  . glipiZIDE (GLUCOTROL XL) 2.5 MG 24 hr tablet Take 2.5 mg by mouth daily.     06/06/2016 at Unknown time  . Glycopyrrolate-Formoterol (BEVESPI AEROSPHERE) 9-4.8 MCG/ACT AERO Inhale 2 puffs into the lungs 2 (two) times daily. 1 Inhaler 12 06/06/2016 at Unknown time  . guaiFENesin (MUCINEX) 600 MG 12 hr tablet Take 600 mg by mouth 2 (two) times daily.    06/06/2016 at Unknown time  . KLOR-CON M20 20 MEQ tablet Take 1 tablet by mouth daily.  5 06/06/2016 at Unknown time  . Multiple Vitamins-Minerals (CENTRUM SILVER ADULT 50+) TABS Take 1 tablet by mouth daily.   06/06/2016 at Unknown time  . naproxen sodium (ANAPROX) 220 MG tablet Take 220 mg by mouth 2 (two) times daily as needed (pain).   06/06/2016 at Unknown time   . nystatin (MYCOSTATIN) 100000 UNIT/ML suspension TAKE 5MLS BY MOUTH 4 TIMES A DAY (Patient taking differently: Use as directed 5 mLs in the mouth or throat 2 (two) times daily as needed (for thrush). ) 360 mL 0 unknown  . omeprazole (PRILOSEC) 40 MG capsule Take 1 capsule (40 mg total) by mouth daily. 30 capsule 0 06/06/2016 at Unknown time  . predniSONE (DELTASONE) 5 MG tablet Take 1-2 tablets (5-10 mg total) by mouth daily. 60 tablet 0 06/06/2016 at Unknown time  . sertraline (ZOLOFT) 100 MG tablet Take 1 tablet by mouth daily.  5 06/06/2016 at Unknown time  . SYMBICORT 160-4.5 MCG/ACT inhaler USE 2 PUFFS TWICE DAILY  1 06/06/2016 at Unknown time  . triamcinolone ointment (KENALOG) 0.1 % Apply 1 application topically daily as needed (for itching).    unknown  . vitamin C (ASCORBIC ACID) 500 MG tablet Take 1,000 mg by mouth daily.    06/06/2016 at Unknown time  . [DISCONTINUED] predniSONE (DELTASONE) 10 MG tablet Take 60 mg tablet 03/07/2016 and taper down by 10 mg daily until you get to 10 mg tablet and then continue taking 10 mg tablet daily. (Patient not taking: Reported on 06/16/2016) 42 tablet 0 Completed Course at Unknown time   Scheduled: . buPROPion  150 mg Oral Daily  . docusate sodium  100 mg Oral BID  . enoxaparin (LOVENOX) injection  40 mg Subcutaneous Q24H  . guaiFENesin  600 mg Oral BID  . insulin aspart  0-15 Units Subcutaneous TID WC  . insulin aspart  0-5 Units Subcutaneous QHS  . ipratropium-albuterol  3 mL Nebulization Q6H  . methylPREDNISolone (SOLU-MEDROL) injection  60 mg Intravenous Q12H  . pantoprazole  80 mg Oral Daily  . sertraline  100 mg Oral Daily   Continuous:  [id number] **OR** acetaminophen, albuterol, ALPRAZolam, nystatin, ondansetron **OR** ondansetron (ZOFRAN) IV  Assesment: She has acute on chronic respiratory failure. She has COPD exacerbation. There is some question about a urinary tract infection and culture is pending. At baseline she also  has sleep apnea. She seems to have improved. She is requiring fairly high flow oxygen at this time. Principal Problem:   Acute and chronic respiratory failure with hypoxia (HCC) Active Problems:   Depression   Anemia, chronic disease  Diabetes mellitus with complication (HCC)   COPD (chronic obstructive pulmonary disease) ([place])   Falls    Plan: I would plan to continue with steroids oxygen inhaled bronchodilators. I will investigate whether she is taking CPAP or not and get her started on that here as well.    LOS: 1 day   Yandel Zeiner L 06/08/2016, 8:49 AM

## 2016-06-08 NOTE — Progress Notes (Signed)
PROGRESS NOTE    Meredith [name]  [id number] DOB: 04/06/1941 DOA: 05/21/2016 PCP: Tula Nakayama    Brief Narrative: Meredith [name] is a 75 y.o. 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  Yesterday, she fell.   Assessment & Plan:   Principal Problem:   Acute and chronic respiratory failure with hypoxia (HCC) Active Problems:   Depression   Anemia, chronic disease   Diabetes mellitus with complication (HCC)   COPD (chronic obstructive pulmonary disease) (HCC)   Falls  1-Acute on chronic hypoxic respiratory failure, secondary to COPD exacerbation.  Continue with nebulizer schedule, IV solumedrol. Pulmonary consulted.  Will start bactrim.    2-Frequent fall;  PT consulted.   3-DM;  SSI. Hold glucotrol.   4-Depression;  Continue with Wellbutrin, Xanax, and Zoloft Psych consulted.   DVT prophylaxis: Lovenox.  Code Status: full code.  Family Communication: none at bedside.  Disposition Plan: to be determine  Consultants:   Dr Luan Pulling.    Procedures: none   Antimicrobials:  none  Subjective: She is feeling better, breathing better.    Objective: Vitals:   06/08/16 0605 06/08/16 0635 06/08/16 0819 06/08/16 0827  BP:  137/64    Pulse:  83    Resp:  20    Temp:  97.7 F (36.5 C)    TempSrc:  Oral    SpO2: 92% 100% (!) 86% (!) 88%  Weight:      Height:        Intake/Output Summary (Last 24 hours) at 06/08/16 0850 Last data filed at 06/08/16 0636  Gross per 24 hour  Intake              240 ml  Output              400 ml  Net             -160 ml   Filed Weights   05/24/2016 1254 05/23/2016 1710  Weight: 90.7 kg (200 lb) 58 kg (127 lb 12.8 oz)    Examination:  General exam: Appears calm and comfortable  Respiratory system:  Respiratory effort normal.  Decrease breath sounds.  Cardiovascular system: S1 & S2 heard, RRR. No JVD, murmurs, rubs, gallops or clicks. No pedal edema. Gastrointestinal system: Abdomen is nondistended, soft and nontender. No organomegaly or masses felt. Normal bowel sounds heard. Central nervous system: Alert and oriented. No focal neurological deficits. Extremities: Symmetric 5 x 5 power. Skin: No rashes, lesions or ulcers Psychiatry: Judgement and insight appear normal. Mood & affect appropriate.     Data Reviewed: I have personally reviewed following labs and imaging studies  CBC:  Recent Labs Lab 06/18/2016 1311 06/08/16 0604  WBC 8.2 5.8  NEUTROABS 6.3  --   HGB 11.9* 11.9*  HCT 40.6 39.4  MCV 100.5* 98.7  PLT 153 [id number]   Basic Metabolic Panel:  Recent Labs Lab 06/05/2016 1311 06/08/16 0604  NA 138 139  K 4.3 4.4  CL 93* 90*  CO2 38* 43*  GLUCOSE 150* 224*  BUN 15 17  CREATININE 0.94 0.94  CALCIUM 8.9 9.2   GFR: Estimated Creatinine Clearance: 48.8 mL/min (by C-G formula based on SCr of 0.94 mg/dL). Liver Function Tests:  Recent Labs Lab 05/25/2016 1311  AST 25  ALT 19  ALKPHOS 56  BILITOT 0.6  PROT 6.6  ALBUMIN 3.7   No results for input(s): LIPASE, AMYLASE in the last 168 hours. No results for input(s): AMMONIA in the last 168 hours. Coagulation Profile: No results for input(s): INR, PROTIME in the last 168 hours. Cardiac Enzymes: No results for input(s): CKTOTAL, CKMB, CKMBINDEX, TROPONINI in the last 168 hours. BNP (last 3 results) No results for input(s): PROBNP in the last 8760 hours. HbA1C: No results for input(s): HGBA1C in the last 72 hours. CBG:  Recent Labs Lab 06/02/2016 2057 06/08/16 0752  GLUCAP 245* 218*   Lipid Profile: No results for input(s): CHOL, HDL, LDLCALC, TRIG, CHOLHDL, LDLDIRECT in the last 72 hours. Thyroid Function Tests: No results for input(s): TSH, T4TOTAL, FREET4, T3FREE, THYROIDAB in the last 72 hours. Anemia Panel: No results for  input(s): VITAMINB12, FOLATE, FERRITIN, TIBC, IRON, RETICCTPCT in the last 72 hours. Sepsis Labs: No results for input(s): PROCALCITON, LATICACIDVEN in the last 168 hours.  No results found for this or any previous visit (from the past 240 hour(s)).       Radiology Studies: Dg Chest 2 View  Result Date: 06/19/2016 CLINICAL DATA:  Fall. EXAM: CHEST  2 VIEW COMPARISON:  03/04/2016. FINDINGS: Mediastinum and hilar structures normal. Low lung volumes with mild basilar atelectasis. COPD. Bilateral pleuroparenchymal thickening noted consistent with scarring. Heart size normal. No acute bony abnormality. IMPRESSION: 1. Low lung volumes with mild basilar atelectasis. COPD. Bilateral pleuroparenchymal thickening noted consistent with scarring. 2.  Heart size normal. Electronically Signed   By: Marcello Moores  Register   On: 05/25/2016 13:43   Dg Tibia/fibula Left  Result Date: 06/01/2016 CLINICAL DATA:  Pt states she fell but unsure when/abrasion and swelling anterior aspect left lower leg EXAM: LEFT TIBIA AND FIBULA - 2 VIEW COMPARISON:  None. FINDINGS: There is no evidence of fracture or other focal bone lesions. Popliteal arterial calcifications noted. Soft tissues are unremarkable. IMPRESSION: No acute findings. Electronically Signed   By: Lucrezia Europe M.D.   On: 06/01/2016 13:42        Scheduled Meds: . buPROPion  150 mg Oral Daily  . docusate sodium  100 mg Oral BID  . enoxaparin (LOVENOX) injection  40 mg Subcutaneous Q24H  . guaiFENesin  600 mg Oral BID  . insulin aspart  0-15 Units Subcutaneous TID WC  . insulin aspart  0-5 Units Subcutaneous QHS  . ipratropium-albuterol  3 mL Nebulization Q6H  . methylPREDNISolone (SOLU-MEDROL) injection  60 mg Intravenous Q12H  . pantoprazole  80 mg Oral Daily  . sertraline  100 mg Oral Daily   Continuous Infusions:   LOS: 1 day    Time spent: 35 minutes.     Elmarie Shiley, MD [facility] Pager [phone]  If 7PM-7AM, please  contact night-coverage [email or web address] Password [email or web address] 06/08/2016, 8:50 AM

## 2016-06-08 NOTE — Evaluation (Signed)
Physical Therapy Evaluation Patient Details Name: Meredith [name] MRN: [id number] DOB: 1940-08-28 Today's Date: 06/08/2016   History of Present Illness  Meredith [name] is a 75 y.o. 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  Yesterday, she fell.  They called 911 at 0200 and the patient refused to come to the ER.  She fell again and they called 911 again and they finally talked her into coming to the hospital.  She has been somewhat confused, family is concerned she may have a UTI - strong urine smell, holds it for long periods of time to avoid having to exert herself.  Occasional incontinence.  Has COPD, already hospitalized twice this year for exacerbations.  This AM was 65%.  Uses 4L at home, on 7L here and 88-90%.  Has been intubated 2 times in the past.  No change in chronic cough.  Quit smoking at least 10 years ago.  Afebrile, 99 this AM.  BP was a bit high.  Clinical Impression  Pt received in bed, and was agreeable to PT evaluation.  Pt states that she normally uses a Rollator walker for mobility at home, and she requires assistance for dressing, bathing and grooming.  She has a nurse aide that comes 2x's/wk, and 24/7 supervision/assistance from her family.  She has had multiple falls recently, and states she does not know why.  She does not remember what happens when she falls.    She is currently on 10L of HFNC.  She was able to perform supine<>sit with Mod (I), and Sit<>stand with Min guard to Min A.  She required Min A when descending into the chair after ambulation when she became anxious and did not get herself fully lined up with the chair prior to sitting.  She ambulated 26ft with 10L of HFNC, and desaturated to 80%.  She was able to recover to 90% with seated rest break, vc's for relaxation and deep  breathing.  She is recommended for HHPT, and a w/c upon d/c.     Follow Up Recommendations Home health PT    Equipment Recommendations  Wheelchair (measurements PT)    Recommendations for Other Services       Precautions / Restrictions Precautions Precautions: Fall Precaution Comments: Reason for admission.  Pt states she doesn't remember what happened when she fell.   Restrictions Weight Bearing Restrictions: No      Mobility  Bed Mobility Overal bed mobility: Modified Independent             General bed mobility comments: Pt able to scoot up in bed and perform supine<>sit and sit<>supine with mod independence-increased time  Transfers Overall transfer level: Needs assistance Equipment used: Rolling walker (2 wheeled) Transfers: Sit to/from Stand Sit to Stand: Min guard         General transfer comment: Min A for descent into the chair due to increased anxiety and poor ability to get lined up with the chair prior to sitting.   Ambulation/Gait Ambulation/Gait assistance: Min guard Ambulation Distance (Feet): 10 Feet Assistive device: Rolling walker (2 wheeled) Gait Pattern/deviations: Step-through pattern     General Gait Details: Pt becomes anxious as she is approaching the chair, and requires vc's to slow down to turn and get lined up with the chair prior to sitting.  Pt remained on 10L of HFNC, and desaturated  to 80%.  Recovered to 90% after 1-2 min with seated rest break with vc's for relaxation and deep breathing  Stairs            Wheelchair Mobility    Modified Rankin (Stroke Patients Only)       Balance Overall balance assessment: History of Falls;Needs assistance Sitting-balance support: Bilateral upper extremity supported;Feet supported Sitting balance-Leahy Scale: Fair       Standing balance-Leahy Scale: Poor                               Pertinent Vitals/Pain Pain Assessment: No/denies pain    Home Living  Family/patient expects to be discharged to:: Private residence Living Arrangements: Children (Dtr, and SIL live on one end.  and grandson's family) Available Help at Discharge: Available 24 hours/day;Family;Personal care attendant (2x's/wk for 1-2 hours.  Helps with bathing, changing sheets on the bed. ) Type of Home: House Home Access: Stairs to enter Entrance Stairs-Rails: Right;Left Entrance Stairs-Number of Steps: 2 - and family assists her in and out.  Home Layout: One level Home Equipment: [place] - 4 wheels;Shower seat;Cane - single point;Walker - 2 wheels      Prior Function Level of Independence: Needs assistance   Gait / Transfers Assistance Needed: Uses rollator, pt reports assistance with tub/shower transfer. Reports she gets up from bed and chair independently  ADL's / Homemaking Assistance Needed: Daughter assists with dressing, bathing, grooming        Hand Dominance   Dominant Hand: Right    Extremity/Trunk Assessment   Upper Extremity Assessment Upper Extremity Assessment: Overall WFL for tasks assessed    Lower Extremity Assessment Lower Extremity Assessment: Generalized weakness       Communication   Communication: No difficulties  Cognition Arousal/Alertness: Awake/alert Behavior During Therapy: WFL for tasks assessed/performed Overall Cognitive Status: Within Functional Limits for tasks assessed                      General Comments      Exercises     Assessment/Plan    PT Assessment Patient needs continued PT services  PT Problem List Decreased strength;Decreased activity tolerance;Decreased balance;Decreased mobility;Decreased knowledge of use of DME;Decreased safety awareness;Decreased knowledge of precautions;Cardiopulmonary status limiting activity;Obesity          PT Treatment Interventions DME instruction;Gait training;Functional mobility training;Therapeutic activities;Therapeutic exercise;Balance training;Patient/family  education    PT Goals (Current goals can be found in the Care Plan section)  Acute Rehab PT Goals Patient Stated Goal: Pt wants to be able to move. PT Goal Formulation: With patient Time For Goal Achievement: 06/15/16 Potential to Achieve Goals: Fair    Frequency Min 3X/week   Barriers to discharge        Co-evaluation               End of Session Equipment Utilized During Treatment: Gait belt;Oxygen (Pt remained on 10L of HFNC) Activity Tolerance: Patient limited by fatigue Patient left: in chair;with call bell/phone within reach Nurse Communication: Mobility status (mobility sheet left in the room.  Discussed mobiltiy status with nurse tech. )    Functional Assessment Tool Used: [place] "6-clicks"  Functional Limitation: Mobility: Walking and moving around Mobility: Walking and Moving Around Current Status [phone]): At least 40 percent but less than 60 percent impaired, limited or restricted Mobility: Walking and Moving Around Goal Status [phone]): At least 20 percent but less than  40 percent impaired, limited or restricted    Time: 1033-1057 PT Time Calculation (min) (ACUTE ONLY): 24 min   Charges:     PT Treatments $Gait Training: 8-22 mins   PT G Codes:   PT G-Codes **NOT FOR INPATIENT CLASS** Functional Assessment Tool Used: [employer] "6-clicks"  Functional Limitation: Mobility: Walking and moving around Mobility: Walking and Moving Around Current Status [phone]): At least 40 percent but less than 60 percent impaired, limited or restricted Mobility: Walking and Moving Around Goal Status [phone]): At least 20 percent but less than 40 percent impaired, limited or restricted    Beth Stanton Kissoon, PT, DPT X: [phone]

## 2016-06-08 NOTE — Care Management Note (Signed)
Case Management Note  Patient Details  Name: Meredith [name] MRN: [id number] Date of Birth: Apr 01, 1941  Subjective/Objective: Patient adm with COPD exacerbation. She is on chronic oxygen, usually 4L. Currently on 10 liters. She lives with a daughter.                    Action/Plan: She has been recommended for HHPT. She would like to use [facility]. Romualdo Bolk  of [facility] notified and will obtain orders from chart. Patient also needs a BSC.    Expected Discharge Date:       06/11/2016           Expected Discharge Plan:  [place]  In-House Referral:  NA  Discharge planning Services  CM Consult  Post Acute Care Choice:  Home Health, Durable Medical Equipment Choice offered to:  Patient  DME Arranged:  3-N-1 DME Agency:  [place]:  RN, PT [facility] Agency:  [place]  Status of Service:  In process, will continue to follow  If discussed at Long Length of Stay Meetings, dates discussed:    Additional Comments:  Javione Gunawan, Chauncey Reading, RN 06/08/2016, 3:29 PM

## 2016-06-08 NOTE — Evaluation (Signed)
Occupational Therapy Evaluation Patient Details Name: Meredith [name] MRN: [id number] DOB: 05-Dec-1940 Today's Date: 06/08/2016    History of Present Illness Meredith [name] is a 75 y.o. 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  Yesterday, she fell.  They called 911 at 0200 and the patient refused to come to the ER.  She fell again and they called 911 again and they finally talked her into coming to the hospital.  She has been somewhat confused, family is concerned she may have a UTI - strong urine smell, holds it for long periods of time to avoid having to exert herself.  Occasional incontinence.  Has COPD, already hospitalized twice this year for exacerbations.  This AM was 65%.  Uses 4L at home, on 7L here and 88-90%.  Has been intubated 2 times in the past.  No change in chronic cough.  Quit smoking at least 10 years ago.  Afebrile, 99 this AM.  BP was a bit high.   Clinical Impression   Pt awake, alert, oriented x4 this am, agreeable to OT evaluation. Pt reports assistance with all ADL tasks at home, daughter assists primarily, other family members assist as necessary. Pt performs bed mobility with mod independence during evaluation with encouragement from OT. SpO2 remaining at 91% on 10L supine and sitting. Sit<>stand transfer and functional mobility not performed as pt became very tearful over her situation, discussed with pt and pt calmed down at end of evaluation. Pt appears to be at baseline with ADL completion, recommend BSC on discharge to limit future falls due to pt attempting to reach bathroom at night. No further OT services required at this time, recommend 24/7 supervision.    Follow Up Recommendations  No OT follow up;Supervision/Assistance - 24 hour    Equipment Recommendations  3 in 1  bedside commode       Precautions / Restrictions Precautions Precautions: Fall Restrictions Weight Bearing Restrictions: No      Mobility Bed Mobility Overal bed mobility: Modified Independent             General bed mobility comments: Pt able to scoot up in bed and perform supine<>sit and sit<>supine with mod independence-increased time  Transfers                 General transfer comment: Not tested         ADL Overall ADL's : Needs assistance/impaired Eating/Feeding: Set up;Bed level                   Lower Body Dressing: Maximal assistance;Sitting/lateral leans                                 Pertinent Vitals/Pain Pain Assessment: No/denies pain     Hand Dominance Right   Extremity/Trunk Assessment Upper Extremity Assessment Upper Extremity Assessment: Overall WFL for tasks assessed   Lower Extremity Assessment Lower Extremity Assessment: Defer to PT evaluation       Communication Communication Communication: No difficulties   Cognition Arousal/Alertness: Awake/alert Behavior During Therapy: WFL for tasks assessed/performed Overall Cognitive Status: Within Functional Limits for tasks assessed  Home Living Family/patient expects to be discharged to:: Private residence Living Arrangements:  (daughter and SIL, grandchildren) Available Help at Discharge: Family;Available 24 hours/day Type of Home: House Home Access: Stairs to enter [employer] of Steps: 2-3 Entrance Stairs-Rails: Right;Left Home Layout: One level     Bathroom Shower/Tub: Teacher, early years/pre: Standard     Home Equipment: Environmental consultant - 4 wheels;Shower seat;Cane - single point          Prior Functioning/Environment Level of Independence: Needs assistance  Gait / Transfers Assistance Needed: Uses rollator, pt reports assistance with tub/shower transfer. Reports she gets up from bed and  chair independently ADL's / Homemaking Assistance Needed: Daughter assists with dressing, bathing, grooming            OT Problem List: Cardiopulmonary status limiting activity    End of Session    Activity Tolerance: Patient tolerated treatment well Patient left: in bed;with call bell/phone within reach   Time: [id number] OT Time Calculation (min): 15 min Charges:  OT General Charges $OT Visit: 1 Procedure OT Evaluation $OT Eval Low Complexity: 1 Procedure Guadelupe Sabin, OTR/L  [phone] 06/08/2016, 9:31 AM

## 2016-06-09 ENCOUNTER — Inpatient Hospital Stay (HOSPITAL_COMMUNITY): Payer: Medicare HMO

## 2016-06-09 LAB — BASIC METABOLIC PANEL
Anion gap: 15 (ref 5–15)
Anion gap: 8 (ref 5–15)
BUN: 29 mg/dL — AB (ref 6–20)
BUN: 41 mg/dL — AB (ref 6–20)
CHLORIDE: 93 mmol/L — AB (ref 101–111)
CO2: 31 mmol/L (ref 22–32)
CO2: 33 mmol/L — ABNORMAL HIGH (ref 22–32)
CREATININE: 1.47 mg/dL — AB (ref 0.44–1.00)
CREATININE: 1.59 mg/dL — AB (ref 0.44–1.00)
Calcium: 8.5 mg/dL — ABNORMAL LOW (ref 8.9–10.3)
Calcium: 8.4 mg/dL — ABNORMAL LOW (ref 8.9–10.3)
Chloride: 98 mmol/L — ABNORMAL LOW (ref 101–111)
GFR calc Af Amer: 40 mL/min — ABNORMAL LOW (ref >60)
GFR calc Af Amer: 36 mL/min — ABNORMAL LOW (ref >60)
GFR calc non Af Amer: 34 mL/min — ABNORMAL LOW (ref >60)
GFR, EST NON AFRICAN AMERICAN: 31 mL/min — AB (ref >60)
GLUCOSE: 313 mg/dL — AB (ref 65–99)
Glucose, Bld: 354 mg/dL — ABNORMAL HIGH (ref 65–99)
Potassium: 5.9 mmol/L — ABNORMAL HIGH (ref 3.5–5.1)
Potassium: 4.5 mmol/L (ref 3.5–5.1)
SODIUM: 139 mmol/L (ref 135–145)
Sodium: 139 mmol/L (ref 135–145)

## 2016-06-09 LAB — BLOOD GAS, ARTERIAL
Acid-Base Excess: 2 mmol/L (ref 0.0–2.0)
Acid-Base Excess: 5.4 mmol/L — ABNORMAL HIGH (ref 0.0–2.0)
Bicarbonate: 25 mmol/L (ref 20.0–28.0)
Bicarbonate: 27.6 mmol/L (ref 20.0–28.0)
DRAWN BY: 277331
Drawn by: 21310
FIO2: 1
FIO2: 40
LHR: 28 {breaths}/min
MECHVT: 500 mL
O2 Saturation: 99 %
O2 Saturation: 88.6 %
PEEP: 5 cmH2O
PEEP: 5 cmH2O
PO2 ART: 219 mmHg — AB (ref 83.0–108.0)
Patient temperature: 37
Patient temperature: 37
RATE: 18 resp/min
VT: 480 mL
pCO2 arterial: 65.6 mmHg (ref 32.0–48.0)
pCO2 arterial: 65.1 mmHg (ref 32.0–48.0)
pH, Arterial: 7.26 — ABNORMAL LOW (ref 7.350–7.450)
pH, Arterial: 7.305 — ABNORMAL LOW (ref 7.350–7.450)
pO2, Arterial: 60 mmHg — ABNORMAL LOW (ref 83.0–108.0)

## 2016-06-09 LAB — LACTIC ACID, PLASMA
LACTIC ACID, VENOUS: 4.3 mmol/L — AB (ref 0.5–1.9)
LACTIC ACID, VENOUS: 2.2 mmol/L — AB (ref 0.5–1.9)

## 2016-06-09 LAB — CBC
HEMATOCRIT: 38.1 % (ref 36.0–46.0)
HEMOGLOBIN: 10.5 g/dL — AB (ref 12.0–15.0)
MCH: 28.5 pg (ref 26.0–34.0)
MCHC: 27.6 g/dL — AB (ref 30.0–36.0)
MCV: 103.5 fL — ABNORMAL HIGH (ref 78.0–100.0)
Platelets: 200 10*3/uL (ref 150–400)
RBC: 3.68 MIL/uL — ABNORMAL LOW (ref 3.87–5.11)
RDW: 12.8 % (ref 11.5–15.5)
WBC: 10.2 10*3/uL (ref 4.0–10.5)

## 2016-06-09 LAB — GLUCOSE, CAPILLARY
GLUCOSE-CAPILLARY: 199 mg/dL — AB (ref 65–99)
Glucose-Capillary: 191 mg/dL — ABNORMAL HIGH (ref 65–99)
Glucose-Capillary: 140 mg/dL — ABNORMAL HIGH (ref 65–99)
Glucose-Capillary: 158 mg/dL — ABNORMAL HIGH (ref 65–99)
Glucose-Capillary: 245 mg/dL — ABNORMAL HIGH (ref 65–99)

## 2016-06-09 LAB — TROPONIN I
TROPONIN I: 0.03 ng/mL — AB (ref <0.03)
Troponin I: 0.1 ng/mL (ref <0.03)

## 2016-06-09 LAB — MAGNESIUM: MAGNESIUM: 1.8 mg/dL (ref 1.7–2.4)

## 2016-06-09 MED ORDER — INSULIN ASPART 100 UNIT/ML ~~LOC~~ SOLN
0.0000 [IU] | Freq: Three times a day (TID) | SUBCUTANEOUS | Status: DC
  Administered 2016-06-10: 3 [IU] via SUBCUTANEOUS

## 2016-06-09 MED ORDER — PANTOPRAZOLE SODIUM 40 MG IV SOLR
40.0000 mg | Freq: Two times a day (BID) | INTRAVENOUS | Status: DC
  Administered 2016-06-10 (×2): 40 mg via INTRAVENOUS
  Filled 2016-06-09 (×3): qty 40

## 2016-06-09 MED ORDER — INSULIN ASPART 100 UNIT/ML IV SOLN
10.0000 [IU] | Freq: Once | INTRAVENOUS | Status: AC
  Administered 2016-06-09: 10 [IU] via INTRAVENOUS

## 2016-06-09 MED ORDER — FENTANYL 2500MCG IN NS 250ML (10MCG/ML) PREMIX INFUSION
INTRAVENOUS | Status: AC
  Filled 2016-06-09: qty 250

## 2016-06-09 MED ORDER — MIDAZOLAM HCL 2 MG/2ML IJ SOLN
2.0000 mg | Freq: Once | INTRAMUSCULAR | Status: AC
  Administered 2016-06-09: 2 mg via INTRAVENOUS

## 2016-06-09 MED ORDER — MIDAZOLAM HCL 2 MG/2ML IJ SOLN: 1.0000 mg | INTRAMUSCULAR | Status: DC | PRN

## 2016-06-09 MED ORDER — VANCOMYCIN HCL IN DEXTROSE 1-5 GM/200ML-% IV SOLN
1000.0000 mg | Freq: Once | INTRAVENOUS | Status: AC
  Administered 2016-06-09: 1000 mg via INTRAVENOUS
  Filled 2016-06-09: qty 200

## 2016-06-09 MED ORDER — LORAZEPAM 2 MG/ML IJ SOLN
1.0000 mg | INTRAMUSCULAR | Status: DC | PRN
  Administered 2016-06-09 – 2016-06-10 (×5): 1 mg via INTRAVENOUS
  Filled 2016-06-09 (×5): qty 1

## 2016-06-09 MED ORDER — ATROPINE SULFATE 1 MG/ML IJ SOLN
0.5000 mg | Freq: Once | INTRAMUSCULAR | Status: AC
  Administered 2016-06-09: 0.5 mg via INTRAVENOUS

## 2016-06-09 MED ORDER — SODIUM CHLORIDE 0.9 % IV SOLN
500.0000 mg | INTRAVENOUS | Status: DC
  Filled 2016-06-09: qty 500

## 2016-06-09 MED ORDER — FENTANYL BOLUS VIA INFUSION
25.0000 ug | INTRAVENOUS | Status: DC | PRN
  Filled 2016-06-09: qty 25

## 2016-06-09 MED ORDER — METHYLPREDNISOLONE SODIUM SUCC 125 MG IJ SOLR
60.0000 mg | Freq: Four times a day (QID) | INTRAMUSCULAR | Status: DC
  Administered 2016-06-09 – 2016-06-10 (×3): 60 mg via INTRAVENOUS
  Filled 2016-06-09 (×4): qty 2

## 2016-06-09 MED ORDER — DOXYCYCLINE HYCLATE 100 MG IV SOLR
100.0000 mg | Freq: Two times a day (BID) | INTRAVENOUS | Status: DC
  Filled 2016-06-09 (×2): qty 100

## 2016-06-09 MED ORDER — SODIUM CHLORIDE 0.9 % IV SOLN
25.0000 ug/h | INTRAVENOUS | Status: DC
  Administered 2016-06-09: 25 ug/h via INTRAVENOUS
  Administered 2016-06-10 (×2): 50 ug/h via INTRAVENOUS
  Filled 2016-06-09: qty 50

## 2016-06-09 MED ORDER — NOREPINEPHRINE BITARTRATE 1 MG/ML IV SOLN
0.0000 ug/min | INTRAVENOUS | Status: DC
  Administered 2016-06-09: 16 ug/min via INTRAVENOUS
  Filled 2016-06-09: qty 16

## 2016-06-09 MED ORDER — PIPERACILLIN-TAZOBACTAM 3.375 G IVPB
3.3750 g | Freq: Three times a day (TID) | INTRAVENOUS | Status: DC
  Administered 2016-06-09 – 2016-06-10 (×2): 3.375 g via INTRAVENOUS
  Filled 2016-06-09 (×3): qty 50

## 2016-06-09 MED ORDER — FENTANYL CITRATE (PF) 100 MCG/2ML IJ SOLN
50.0000 ug | Freq: Once | INTRAMUSCULAR | Status: AC
  Administered 2016-06-09: 50 ug via INTRAVENOUS
  Filled 2016-06-09: qty 2

## 2016-06-09 MED ORDER — SODIUM CHLORIDE 0.9 % IV SOLN
1.0000 g | Freq: Once | INTRAVENOUS | Status: AC
  Administered 2016-06-09: 1 g via INTRAVENOUS
  Filled 2016-06-09: qty 10

## 2016-06-09 MED ORDER — LEVETIRACETAM 500 MG/5ML IV SOLN
INTRAVENOUS | Status: AC
  Filled 2016-06-09: qty 10

## 2016-06-09 MED ORDER — SODIUM CHLORIDE 0.9 % IV BOLUS (SEPSIS)
500.0000 mL | Freq: Once | INTRAVENOUS | Status: AC
  Administered 2016-06-09: 500 mL via INTRAVENOUS

## 2016-06-09 MED ORDER — SULFAMETHOXAZOLE-TRIMETHOPRIM 800-160 MG PO TABS: 1.0000 | ORAL_TABLET | Freq: Two times a day (BID) | ORAL | Status: DC

## 2016-06-09 MED ORDER — FENTANYL CITRATE (PF) 100 MCG/2ML IJ SOLN
50.0000 ug | INTRAMUSCULAR | Status: DC | PRN
  Administered 2016-06-09: 50 ug via INTRAVENOUS
  Filled 2016-06-09: qty 2

## 2016-06-09 MED ORDER — SODIUM POLYSTYRENE SULFONATE 15 GM/60ML PO SUSP
30.0000 g | Freq: Once | ORAL | Status: AC
  Administered 2016-06-09: 30 g
  Filled 2016-06-09: qty 120

## 2016-06-09 MED ORDER — SODIUM CHLORIDE 0.9 % IV SOLN
1000.0000 mg | Freq: Two times a day (BID) | INTRAVENOUS | Status: DC
  Administered 2016-06-10: 1000 mg via INTRAVENOUS
  Filled 2016-06-09 (×2): qty 10

## 2016-06-09 MED ORDER — IPRATROPIUM-ALBUTEROL 0.5-2.5 (3) MG/3ML IN SOLN
3.0000 mL | Freq: Four times a day (QID) | RESPIRATORY_TRACT | Status: DC
  Administered 2016-06-09 – 2016-06-10 (×3): 3 mL via RESPIRATORY_TRACT
  Filled 2016-06-09 (×3): qty 3

## 2016-06-09 MED ORDER — MIDAZOLAM HCL 2 MG/2ML IJ SOLN
1.0000 mg | INTRAMUSCULAR | Status: DC | PRN
  Filled 2016-06-09 (×2): qty 2

## 2016-06-09 MED ORDER — SODIUM CHLORIDE 0.9 % IV SOLN
INTRAVENOUS | Status: DC
  Administered 2016-06-09: 18:00:00 via INTRAVENOUS

## 2016-06-09 MED ORDER — NOREPINEPHRINE BITARTRATE 1 MG/ML IV SOLN
INTRAVENOUS | Status: AC
  Filled 2016-06-09: qty 16

## 2016-06-09 MED ORDER — FENTANYL CITRATE (PF) 100 MCG/2ML IJ SOLN: 50.0000 ug | INTRAMUSCULAR | Status: DC | PRN

## 2016-06-09 MED ORDER — MAGNESIUM SULFATE 2 GM/50ML IV SOLN
2.0000 g | Freq: Once | INTRAVENOUS | Status: AC
  Administered 2016-06-09: 2 g via INTRAVENOUS
  Filled 2016-06-09: qty 50

## 2016-06-09 MED ORDER — NOREPINEPHRINE BITARTRATE 1 MG/ML IV SOLN
0.0000 ug/min | INTRAVENOUS | Status: DC
  Administered 2016-06-09: 5 ug/min via INTRAVENOUS
  Filled 2016-06-09: qty 4

## 2016-06-09 MED ORDER — SODIUM CHLORIDE 0.9 % IV BOLUS (SEPSIS)
1000.0000 mL | Freq: Once | INTRAVENOUS | Status: AC
  Administered 2016-06-09: 1000 mL via INTRAVENOUS

## 2016-06-09 NOTE — Code Documentation (Addendum)
RESPOND TO CODE BLUE  Upon arrival, no spontaneous respiration and no pulse. ACLS protocol followed. IV access at the site was not working, Epinephrine was given thru the ETT. CPR was continued with adequate compressions. IV access subsequently established, and 2 sequential Epi's were given. ROSC was obtained with ST at 150 and SBP 130 on no pressor.  CXR and labs were pending. Post arrest care resumed by her attending Dr Demetrius Revel.  Thanks,  Orvan Falconer MD FACP. Hospitalist.   CC time:  40 min.

## 2016-06-09 NOTE — Progress Notes (Signed)
**Note De-identified Jakelyn Squyres Obfuscation** EKG completed; RN notified and placed in patient chart. 

## 2016-06-09 NOTE — ED Provider Notes (Signed)
Asked to provide central line access to patient who has just coded - general surgery 30 minutes away, hospitalist insistent on needing help now - pt is intubated (by me), has low pressure, bradycardia, post arrest rhythm is NSR.   Physical Exam  ED Course  .Central Line Date/Time: 06/09/2016 5:02 PM Performed by: Noemi Chapel Authorized by: Noemi Chapel   Consent:    Consent obtained:  Emergent situation Universal protocol:    Relevant documents present and verified: yes     Imaging studies available: yes     Required blood products, implants, devices, and special equipment available: yes     Site/side marked: yes     Immediately prior to procedure, a time out was called: yes     Patient identity confirmed:  Arm band Pre-procedure details:    Hand hygiene: Hand hygiene performed prior to insertion     Sterile barrier technique: All elements of maximal sterile technique followed     Skin preparation:  ChloraPrep   Skin preparation agent: Skin preparation agent completely dried prior to procedure   Anesthesia (see MAR for exact dosages):    Anesthesia method:  None Procedure details:    Location:  R internal jugular   Patient position:  Trendelenburg   Procedural supplies:  Triple lumen   Catheter size: Triple Lumen.   Landmarks identified: yes     Ultrasound guidance: yes     Sterile ultrasound techniques: Sterile gel and sterile probe covers were used     Number of attempts:  1   Successful placement: yes   Post-procedure details:    Post-procedure:  Dressing applied and line sutured   Assessment:  Blood return through all ports and free fluid flow   Patient tolerance of procedure:  Tolerated well, no immediate complications           Noemi Chapel, MD 06/09/16 1704

## 2016-06-09 NOTE — Progress Notes (Addendum)
  PROGRESS NOTE    [name]  [id number] DOB: 1940/12/07 DOA: 06/04/2016 PCP: Bing Matter, PA-C    LOS: 2 days   Patient found down by nurse, without oxygen,. Code blue called. Patient was on asystole. She recived epinephrine times 3, she was intubated. Suspect event related to acute respiratory arrest,.  Patient transfer to ICU.   1-Acute Cardiac - Respiratory Arrest:  Patient was coded for 20 minutes.  Vent support.  Central line was placed. Currently on IV levophed.  CCM consulted.  Sedation ordered.  Will follow labs to evaluate for acidosis; ABG, B-met, troponin.  Chest x ray: with low lung volume. No evidence of infection.  Mg at 1.8. Will replace IV.  prophylasix GI; Protonix IV BID.  EKG ordered.  Will change bactrim to IV doxy. Patient was on antibiotics for COPD.  Hyperkalemia;  Calcium gluconate ordered.  Insulin.  Kayexalate through NG.   Hypotension; check lactic acid.  On levophed.  IV bolus.  IV fluids.  Will broad antibiotics.   Multiples family member updated.   Time spent: 35 minutes.     Elmarie Shiley, MD [facility] Pager [phone]  If 7PM-7AM, please contact night-coverage [email or web address] Password [facility] 06/09/2016, 4:23 PM

## 2016-06-09 NOTE — Progress Notes (Signed)
[place] Progress Note Patient Name: KIARRAH DEPRIEST DOB: 01-09-1941 MRN: [id number]   Date of Service  06/09/2016  HPI/Events of Note   Recent Labs Lab 06/09/16 1705  PHART 7.260*  PCO2ART 65.6*  PO2ART 219*  HCO3 25.0  O2SAT 99.0     eICU Interventions  Will increase RR to 28, follow      Intervention Category Major Interventions: Acid-Base disturbance - evaluation and management  Rosa Wyly S. 06/09/2016, 5:32 PM

## 2016-06-09 NOTE — ED Provider Notes (Signed)
[place]  Department of Emergency Medicine   Code Blue CONSULT NOTE  Chief Complaint: Cardiac arrest/unresponsive   Level V Caveat: Unresponsive  History of present illness: I was contacted by the hospital for a CODE BLUE cardiac arrest upstairs and presented to the patient's bedside.   Medical CODE BLUE called due to asystole and respiratory arrest, the patient was found unresponsive and not breathing in her bed  ROS: Unable to obtain, Level V caveat  Scheduled Meds: . buPROPion  150 mg Oral Daily  . docusate sodium  100 mg Oral BID  . enoxaparin (LOVENOX) injection  40 mg Subcutaneous Q24H  . feeding supplement (ENSURE ENLIVE)  237 mL Oral Q24H  . guaiFENesin  600 mg Oral BID  . insulin aspart  0-15 Units Subcutaneous TID WC  . insulin aspart  0-5 Units Subcutaneous QHS  . ipratropium-albuterol  3 mL Nebulization Q6H WA  . methylPREDNISolone (SOLU-MEDROL) injection  60 mg Intravenous Q12H  . pantoprazole  80 mg Oral Daily  . sertraline  100 mg Oral Daily  . sulfamethoxazole-trimethoprim  1 tablet Oral Q12H   Continuous Infusions: . norepinephrine (LEVOPHED) Adult infusion     PRN Meds:.acetaminophen **OR** acetaminophen, albuterol, ALPRAZolam, nystatin, ondansetron **OR** ondansetron (ZOFRAN) IV Past Medical History:  Diagnosis Date  . Anxiety   . Chronic respiratory failure with hypoxia and hypercapnia (HCC)    Nocturnal BiPAP / 4 L/m at rest & 5 L/m with exertion  . Colon cancer ([place]) 2002   colon cancer  . COPD, very severe ([place])   . DM type 2 (diabetes mellitus, type 2) (HCC)    steroid induced  . Insomnia    Past Surgical History:  Procedure Laterality Date  . COLON SURGERY    . Cyst removed from Neck    . FOOT SURGERY Right    Social History   Social History  . Marital status: Widowed    Spouse name: N/A  . Number of children: N/A  . Years of education: N/A   Occupational History  . retired    Social History Main Topics  .  Smoking status: Former Smoker    Packs/day: 2.00    Years: 40.00    Types: Cigarettes    Quit date: 06/21/2008  . Smokeless tobacco: Former Systems developer    Quit date: 09/03/2008  . Alcohol use No  . Drug use: No  . Sexual activity: Not Currently   Other Topics Concern  . Not on file   Social History Narrative   Has a home nurse that visits twice weekly. No pets. No bird or mold exposure. No recent travel.   Allergies  Allergen Reactions  . Morphine     Made high strung and have hallucination   . Tramadol Other (See Comments)    Makes her mean  . Zoloft [Sertraline Hcl] Other (See Comments)    Made her cry a lot    Last set of Vital Signs (not current) Vitals:   06/08/16 2123 06/09/16 0648  BP: (!) 112/36 (!) 138/56  Pulse: 93 95  Resp: (!) 22 (!) 21  Temp: 98 F (36.7 C) 98.4 F (36.9 C)      Physical Exam  Gen: unresponsive Cardiovascular: pulseless  Resp: apneic. Breath sounds equal bilaterally with bagging  Abd: nondistended  Neuro: GCS 3, unresponsive to pain  HEENT: No blood in posterior pharynx, gag reflex absent  Neck: No crepitus  Musculoskeletal: No deformity  Skin: warm  Procedures  INTUBATION Performed  by: Johnna Acosta Required items: required blood products, implants, devices, and special equipment available Patient identity confirmed: provided demographic data and hospital-assigned identification number Time out: Immediately prior to procedure a "time out" was called to verify the correct patient, procedure, equipment, support staff and site/side marked as required. Indications: Apnea  Intubation method: Direct laryngoscopy Preoxygenation: BVM Sedatives: None  Paralytic: None  Tube Size: 7.5 cuffed Post-procedure assessment: chest rise and ETCO2 monitor Breath sounds: equal and absent over the epigastrium Tube secured by Respiratory Therapy Patient tolerated the procedure well with no immediate complications.    Cardiopulmonary Resuscitation  (CPR) Procedure Note  Directed/Performed by: Johnna Acosta I personally directed ancillary staff and/or performed CPR in an effort to regain return of spontaneous circulation and to maintain cardiac, neuro and systemic perfusion.    Medical Decision making   Dr. Marin Comment at the bedside directing care of the code   Assessment and Plan   Ongoing rescucitation Critically ill    Noemi Chapel, MD 06/09/16 [phone]

## 2016-06-09 NOTE — Progress Notes (Signed)
PT Cancellation Note  Patient Details Name: Meredith [name] MRN: [id number] DOB: 20-Jul-1940   Cancelled Treatment:    Reason Eval/Treat Not Completed: Medical issues which prohibited therapy (Pt currently in code Blue where pt was found in her room unresponsive and not breathing.  She has been intubated.  PT will sign off at this time due to pt's change in medical status.  Please reorder when pt is able to participate.  Thank you. )   Beth Alexcia Schools, PT, DPT X: [phone]

## 2016-06-09 NOTE — Progress Notes (Signed)
PROGRESS NOTE    Meredith [name]  [id number] DOB: [birth year] DOA: 05/30/2016 PCP: Tula Nakayama    Brief Narrative: Meredith [name] is a 75 y.o. 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  Yesterday, she fell.   Assessment & Plan:   Principal Problem:   Acute and chronic respiratory failure with hypoxia (HCC) Active Problems:   Depression   Anemia, chronic disease   Diabetes mellitus with complication (HCC)   COPD (chronic obstructive pulmonary disease) (HCC)   Falls  1-Acute on chronic hypoxic respiratory failure, secondary to COPD exacerbation.  Continue with nebulizer schedule, IV solumedrol.  Pulmonary consulted.  Will start bactrim.  Still requiring significant amount of  Discussed with patient 's daughter, patient has not been using CIPAP at home as instructed by her pulmonologist.   2-Frequent fall;  PT consulted.   3-DM;  SSI. Hold glucotrol.   4-Depression;  Continue with Wellbutrin, Xanax, and Zoloft Psych consulted. Awaiting evaluation.    DVT prophylaxis: Lovenox.  Code Status: full code.  Family Communication: none at bedside.  Disposition Plan: to be determine  Consultants:   Dr Luan Pulling.    Procedures: none   Antimicrobials:  none  Subjective: She is feeling better, breathing better. She relates that she didn't sleep last night    Objective: Vitals:   06/08/16 2123 06/09/16 0154 06/09/16 0648 06/09/16 0758  BP: (!) 112/36  (!) 138/56   Pulse: 93  95   Resp: (!) 22  (!) 21   Temp: 98 F (36.7 C)  98.4 F (36.9 C)   TempSrc: Oral  Oral   SpO2: 93% 97% 97% 90%  Weight:   56.5 kg (124 lb 9 oz)   Height:        Intake/Output Summary (Last 24 hours) at 06/09/16 1339 Last data filed at 06/08/16 1800  Gross per 24 hour  Intake               240 ml  Output              700 ml  Net             -460 ml   Filed Weights   05/21/2016 1254 06/13/2016 1710 06/09/16 0648  Weight: 90.7 kg (200 lb) 58 kg (127 lb 12.8 oz) 56.5 kg (124 lb 9 oz)    Examination:  General exam: Appears calm and comfortable  Respiratory system:  Respiratory effort normal. Decrease breath sounds.  Cardiovascular system: S1 & S2 heard, RRR. No JVD, murmurs, rubs, gallops or clicks. No pedal edema. Gastrointestinal system: Abdomen is nondistended, soft and nontender. No organomegaly or masses felt. Normal bowel sounds heard. Central nervous system: Alert and oriented. No focal neurological deficits. Extremities: Symmetric 5 x 5 power. Skin: No rashes, lesions or ulcers Psychiatry: Judgement and insight appear normal. Mood & affect appropriate.     Data Reviewed: I have personally reviewed following labs and imaging studies  CBC:  Recent Labs Lab 05/30/2016 1311 06/08/16 0604  WBC 8.2 5.8  NEUTROABS 6.3  --   HGB 11.9* 11.9*  HCT 40.6 39.4  MCV 100.5* 98.7  PLT 153 [id number]   Basic Metabolic Panel:  Recent Labs Lab 05/28/2016 1311 06/08/16 0604  NA 138 139  K 4.3 4.4  CL 93* 90*  CO2 38* 43*  GLUCOSE 150* 224*  BUN 15 17  CREATININE 0.94 0.94  CALCIUM 8.9 9.2   GFR: Estimated Creatinine Clearance: 47.5 mL/min (by C-G formula based on SCr of 0.94 mg/dL). Liver Function Tests:  Recent Labs Lab 06/14/2016 1311  AST 25  ALT 19  ALKPHOS 56  BILITOT 0.6  PROT 6.6  ALBUMIN 3.7   No results for input(s): LIPASE, AMYLASE in the last 168 hours. No results for input(s): AMMONIA in the last 168 hours. Coagulation Profile: No results for input(s): INR, PROTIME in the last 168 hours. Cardiac Enzymes: No results for input(s): CKTOTAL, CKMB, CKMBINDEX, TROPONINI in the last 168 hours. BNP (last 3 results) No results for input(s): PROBNP in the last 8760 hours. HbA1C: No results for input(s): HGBA1C in the last 72 hours. CBG:  Recent  Labs Lab 06/08/16 1105 06/08/16 1627 06/08/16 2121 06/09/16 0717 06/09/16 1145  GLUCAP 254* 240* 205* 191* 140*   Lipid Profile: No results for input(s): CHOL, HDL, LDLCALC, TRIG, CHOLHDL, LDLDIRECT in the last 72 hours. Thyroid Function Tests: No results for input(s): TSH, T4TOTAL, FREET4, T3FREE, THYROIDAB in the last 72 hours. Anemia Panel: No results for input(s): VITAMINB12, FOLATE, FERRITIN, TIBC, IRON, RETICCTPCT in the last 72 hours. Sepsis Labs: No results for input(s): PROCALCITON, LATICACIDVEN in the last 168 hours.  No results found for this or any previous visit (from the past 240 hour(s)).       Radiology Studies: No results found.      Scheduled Meds: . buPROPion  150 mg Oral Daily  . docusate sodium  100 mg Oral BID  . enoxaparin (LOVENOX) injection  40 mg Subcutaneous Q24H  . feeding supplement (ENSURE ENLIVE)  237 mL Oral Q24H  . guaiFENesin  600 mg Oral BID  . insulin aspart  0-15 Units Subcutaneous TID WC  . insulin aspart  0-5 Units Subcutaneous QHS  . ipratropium-albuterol  3 mL Nebulization Q6H  . methylPREDNISolone (SOLU-MEDROL) injection  60 mg Intravenous Q12H  . pantoprazole  80 mg Oral Daily  . sertraline  100 mg Oral Daily   Continuous Infusions:   LOS: 2 days    Time spent: 35 minutes.     Elmarie Shiley, MD [facility] Pager [phone]  If 7PM-7AM, please contact night-coverage [email or web address] Password [email or web address] 06/09/2016, 1:39 PM

## 2016-06-09 NOTE — Progress Notes (Signed)
Pharmacy Antibiotic Note  Meredith [name] is a 75 y.o. female admitted on 06/12/2016 with pneumonia.  Pharmacy has been consulted for vanc and zosyn dosing.  Plan: Vancomycin 1 gm IV X 1 then 500 mg IV q24 hours Zosyn 3.375 gm iV q8 hours F/u renal function, cultures and clinical course  Height: 5\' 6"  (167.6 cm) Weight: 124 lb 9 oz (56.5 kg) IBW/kg (Calculated) : 59.3  Temp (24hrs), Avg:98.2 F (36.8 C), Min:98 F (36.7 C), Max:98.4 F (36.9 C)   Recent Labs Lab 06/16/2016 1311 06/08/16 0604 06/09/16 1630  WBC 8.2 5.8 10.2  CREATININE 0.94 0.94 1.47*    Estimated Creatinine Clearance: 30.4 mL/min (by C-G formula based on SCr of 1.47 mg/dL (H)).    Allergies  Allergen Reactions  . Morphine     Made high strung and have hallucination   . Tramadol Other (See Comments)    Makes her mean  . Zoloft [Sertraline Hcl] Other (See Comments)    Made her cry a lot    Antimicrobials this admission: vanc 12/20 >>  zosyn 12/20 >>    Thank you for allowing pharmacy to be a part of this patient's care.  Excell Seltzer Poteet 06/09/2016 6:14 PM

## 2016-06-09 NOTE — Progress Notes (Signed)
[place] in to set up the computer for the tele psych consult and the patient was found to be non responsive.  Code Blue was initiated.  Code:  1556 compressions initiated  1601 CBG 158 1# EPI [id number] ETT 2# EPI [id number] IV started  [id number] IV started 1605 # 22 Lip          7.5 ET tube          + color change           Chest Xray  1607 asystole/no pulse 1608  Wide QRS no pulse  1610 HR 130, positive pulses carotid/ femoral          1000 cc NS bolus  1600 Daughter Lattie Haw was notified of the change In the patients condition and they should come to the hospital.  She verbalized understanding.  1615 The patient was transferred to the ICU via stretcher with staff. Family was directed to the ICU waiting room.  Report was given to [place], Therapist, sports.

## 2016-06-09 NOTE — Progress Notes (Signed)
Subjective: She says she is ready to go home but she is breathing about 35 times a minute and is requiring 8-10 L of oxygen and still hypoxic. She says she feels about black baseline. I do not know her well and do not know what her baseline is but I don't think she's ready for discharge  Objective: Vital signs in last 24 hours: Temp:  [98 F (36.7 C)-98.6 F (37 C)] 98.4 F (36.9 C) (12/20 0648) Pulse Rate:  [91-95] 95 (12/20 0648) Resp:  [20-22] 21 (12/20 0648) BP: (112-138)/(36-56) 138/56 (12/20 0648) SpO2:  [90 %-97 %] 90 % (12/20 0758) Weight:  [56.5 kg (124 lb 9 oz)] 56.5 kg (124 lb 9 oz) (12/20 0648) Weight change: -34.219 kg (-75 lb 7 oz) Last BM Date: 06/06/16  Intake/Output from previous day: 12/19 0701 - 12/20 0700 In: 600 [P.O.:600] Out: 1500 [Urine:1500]  PHYSICAL EXAM General appearance: alert and moderate distress Resp: Significant wheezing bilaterally. Cardio: regular rate and rhythm, S1, S2 normal, no murmur, click, rub or gallop GI: soft, non-tender; bowel sounds normal; no masses,  no organomegaly Extremities: extremities normal, atraumatic, no cyanosis or edema Skin warm and dry. Pupils react  Lab Results:  Results for orders placed or performed during the hospital encounter of 06/12/2016 (from the past 48 hour(s))  CBC with Differential/Platelet     Status: Abnormal   Collection Time: 06/19/2016  1:11 PM  Result Value Ref Range   WBC 8.2 4.0 - 10.5 K/uL   RBC 4.04 3.87 - 5.11 MIL/uL   Hemoglobin 11.9 (L) 12.0 - 15.0 g/dL   HCT 40.6 36.0 - 46.0 %   MCV 100.5 (H) 78.0 - 100.0 fL   MCH 29.5 26.0 - 34.0 pg   MCHC 29.3 (L) 30.0 - 36.0 g/dL   RDW 13.3 11.5 - 15.5 %   Platelets 153 150 - 400 K/uL   Neutrophils Relative % 77 %   Neutro Abs 6.3 1.7 - 7.7 K/uL   Lymphocytes Relative 11 %   Lymphs Abs 0.9 0.7 - 4.0 K/uL   Monocytes Relative 9 %   Monocytes Absolute 0.8 0.1 - 1.0 K/uL   Eosinophils Relative 2 %   Eosinophils Absolute 0.2 0.0 - 0.7 K/uL   Basophils Relative 1 %   Basophils Absolute 0.0 0.0 - 0.1 K/uL  Comprehensive metabolic panel     Status: Abnormal   Collection Time: 06/14/2016  1:11 PM  Result Value Ref Range   Sodium 138 135 - 145 mmol/L   Potassium 4.3 3.5 - 5.1 mmol/L   Chloride 93 (L) 101 - 111 mmol/L   CO2 38 (H) 22 - 32 mmol/L   Glucose, Bld 150 (H) 65 - 99 mg/dL   BUN 15 6 - 20 mg/dL   Creatinine, Ser 0.94 0.44 - 1.00 mg/dL   Calcium 8.9 8.9 - 10.3 mg/dL   Total Protein 6.6 6.5 - 8.1 g/dL   Albumin 3.7 3.5 - 5.0 g/dL   AST 25 15 - 41 U/L   ALT 19 14 - 54 U/L   Alkaline Phosphatase 56 38 - 126 U/L   Total Bilirubin 0.6 0.3 - 1.2 mg/dL   GFR calc non Af Amer 59 (L) >60 mL/min   GFR calc Af Amer >60 >60 mL/min    Comment: (NOTE) The eGFR has been calculated using the CKD EPI equation. This calculation has not been validated in all clinical situations. eGFR's persistently <60 mL/min signify possible Chronic Kidney Disease.    Anion gap  7 5 - 15  Brain natriuretic peptide     Status: None   Collection Time: 06/08/2016  1:11 PM  Result Value Ref Range   B Natriuretic Peptide 98.0 0.0 - 100.0 pg/mL  I-stat troponin, ED     Status: None   Collection Time: 05/21/2016  1:30 PM  Result Value Ref Range   Troponin i, poc 0.01 0.00 - 0.08 ng/mL   Comment 3            Comment: Due to the release kinetics of cTnI, a negative result within the first hours of the onset of symptoms does not rule out myocardial infarction with certainty. If myocardial infarction is still suspected, repeat the test at appropriate intervals.   Urinalysis, Routine w reflex microscopic     Status: Abnormal   Collection Time: 06/19/2016  3:52 PM  Result Value Ref Range   Color, Urine YELLOW YELLOW   APPearance CLEAR CLEAR   Specific Gravity, Urine >1.030 (H) 1.005 - 1.030   pH 6.0 5.0 - 8.0   Glucose, UA NEGATIVE NEGATIVE mg/dL   Hgb urine dipstick NEGATIVE NEGATIVE   Bilirubin Urine NEGATIVE NEGATIVE   Ketones, ur TRACE (A) NEGATIVE  mg/dL   Protein, ur NEGATIVE NEGATIVE mg/dL   Nitrite NEGATIVE NEGATIVE   Leukocytes, UA NEGATIVE NEGATIVE    Comment: Microscopic not done on urines with negative protein, blood, leukocytes, nitrite, or glucose < 500 mg/dL.  Glucose, capillary     Status: Abnormal   Collection Time: 06/01/2016  8:57 PM  Result Value Ref Range   Glucose-Capillary 245 (H) 65 - 99 mg/dL  Basic metabolic panel     Status: Abnormal   Collection Time: 06/08/16  6:04 AM  Result Value Ref Range   Sodium 139 135 - 145 mmol/L   Potassium 4.4 3.5 - 5.1 mmol/L   Chloride 90 (L) 101 - 111 mmol/L   CO2 43 (H) 22 - 32 mmol/L   Glucose, Bld 224 (H) 65 - 99 mg/dL   BUN 17 6 - 20 mg/dL   Creatinine, Ser 0.94 0.44 - 1.00 mg/dL   Calcium 9.2 8.9 - 10.3 mg/dL   GFR calc non Af Amer 59 (L) >60 mL/min   GFR calc Af Amer >60 >60 mL/min    Comment: (NOTE) The eGFR has been calculated using the CKD EPI equation. This calculation has not been validated in all clinical situations. eGFR's persistently <60 mL/min signify possible Chronic Kidney Disease.    Anion gap 6 5 - 15  CBC     Status: Abnormal   Collection Time: 06/08/16  6:04 AM  Result Value Ref Range   WBC 5.8 4.0 - 10.5 K/uL   RBC 3.99 3.87 - 5.11 MIL/uL   Hemoglobin 11.9 (L) 12.0 - 15.0 g/dL   HCT 39.4 36.0 - 46.0 %   MCV 98.7 78.0 - 100.0 fL   MCH 29.8 26.0 - 34.0 pg   MCHC 30.2 30.0 - 36.0 g/dL   RDW 12.9 11.5 - 15.5 %   Platelets 177 150 - 400 K/uL  Glucose, capillary     Status: Abnormal   Collection Time: 06/08/16  7:52 AM  Result Value Ref Range   Glucose-Capillary 218 (H) 65 - 99 mg/dL  Glucose, capillary     Status: Abnormal   Collection Time: 06/08/16 11:05 AM  Result Value Ref Range   Glucose-Capillary 254 (H) 65 - 99 mg/dL  Glucose, capillary     Status: Abnormal   Collection Time:  06/08/16  4:27 PM  Result Value Ref Range   Glucose-Capillary 240 (H) 65 - 99 mg/dL  Glucose, capillary     Status: Abnormal   Collection Time: 06/08/16  9:21  PM  Result Value Ref Range   Glucose-Capillary 205 (H) 65 - 99 mg/dL   Comment 1 Notify RN    Comment 2 Document in Chart   Glucose, capillary     Status: Abnormal   Collection Time: 06/09/16  7:17 AM  Result Value Ref Range   Glucose-Capillary 191 (H) 65 - 99 mg/dL    ABGS No results for input(s): PHART, PO2ART, TCO2, HCO3 in the last 72 hours.  Invalid input(s): PCO2 CULTURES No results found for this or any previous visit (from the past 240 hour(s)). Studies/Results: Dg Chest 2 View  Result Date: 05/24/2016 CLINICAL DATA:  Fall. EXAM: CHEST  2 VIEW COMPARISON:  03/04/2016. FINDINGS: Mediastinum and hilar structures normal. Low lung volumes with mild basilar atelectasis. COPD. Bilateral pleuroparenchymal thickening noted consistent with scarring. Heart size normal. No acute bony abnormality. IMPRESSION: 1. Low lung volumes with mild basilar atelectasis. COPD. Bilateral pleuroparenchymal thickening noted consistent with scarring. 2.  Heart size normal. Electronically Signed   By: Marcello Moores  Register   On: 06/06/2016 13:43   Dg Tibia/fibula Left  Result Date: 05/29/2016 CLINICAL DATA:  Pt states she fell but unsure when/abrasion and swelling anterior aspect left lower leg EXAM: LEFT TIBIA AND FIBULA - 2 VIEW COMPARISON:  None. FINDINGS: There is no evidence of fracture or other focal bone lesions. Popliteal arterial calcifications noted. Soft tissues are unremarkable. IMPRESSION: No acute findings. Electronically Signed   By: Lucrezia Europe M.D.   On: 06/06/2016 13:42    Medications:  Prior to Admission:  Prescriptions Prior to Admission  Medication Sig Dispense Refill Last Dose  . albuterol (ACCUNEB) 1.25 MG/3ML nebulizer solution Take 3 mLs by nebulization every 4 (four) hours as needed for shortness of breath.   06/03/2016 at Unknown time  . albuterol (PROVENTIL HFA;VENTOLIN HFA) 108 (90 Base) MCG/ACT inhaler Inhale 2 puffs into the lungs every 6 (six) hours as needed for wheezing or  shortness of breath. 1 Inhaler 5 unknown  . ALPRAZolam (XANAX) 1 MG tablet Take 1 mg by mouth 3 (three) times daily as needed for anxiety or sleep. Take 1/2 to 1 tablet by mouth three times daily as needed   06/06/2016 at Unknown time  . buPROPion (WELLBUTRIN SR) 150 MG 12 hr tablet Take 1 tablet by mouth daily.  5 06/06/2016 at Unknown time  . glipiZIDE (GLUCOTROL XL) 2.5 MG 24 hr tablet Take 2.5 mg by mouth daily.     06/06/2016 at Unknown time  . Glycopyrrolate-Formoterol (BEVESPI AEROSPHERE) 9-4.8 MCG/ACT AERO Inhale 2 puffs into the lungs 2 (two) times daily. 1 Inhaler 12 06/06/2016 at Unknown time  . guaiFENesin (MUCINEX) 600 MG 12 hr tablet Take 600 mg by mouth 2 (two) times daily.    06/06/2016 at Unknown time  . KLOR-CON M20 20 MEQ tablet Take 1 tablet by mouth daily.  5 06/06/2016 at Unknown time  . Multiple Vitamins-Minerals (CENTRUM SILVER ADULT 50+) TABS Take 1 tablet by mouth daily.   06/06/2016 at Unknown time  . naproxen sodium (ANAPROX) 220 MG tablet Take 220 mg by mouth 2 (two) times daily as needed (pain).   06/06/2016 at Unknown time  . nystatin (MYCOSTATIN) 100000 UNIT/ML suspension TAKE 5MLS BY MOUTH 4 TIMES A DAY (Patient taking differently: Use as directed 5 mLs in  the mouth or throat 2 (two) times daily as needed (for thrush). ) 360 mL 0 unknown  . omeprazole (PRILOSEC) 40 MG capsule Take 1 capsule (40 mg total) by mouth daily. 30 capsule 0 06/06/2016 at Unknown time  . predniSONE (DELTASONE) 5 MG tablet Take 1-2 tablets (5-10 mg total) by mouth daily. 60 tablet 0 06/06/2016 at Unknown time  . sertraline (ZOLOFT) 100 MG tablet Take 1 tablet by mouth daily.  5 06/06/2016 at Unknown time  . SYMBICORT 160-4.5 MCG/ACT inhaler USE 2 PUFFS TWICE DAILY  1 06/06/2016 at Unknown time  . triamcinolone ointment (KENALOG) 0.1 % Apply 1 application topically daily as needed (for itching).    unknown  . vitamin C (ASCORBIC ACID) 500 MG tablet Take 1,000 mg by mouth daily.    06/06/2016 at  Unknown time  . [DISCONTINUED] predniSONE (DELTASONE) 10 MG tablet Take 60 mg tablet 03/07/2016 and taper down by 10 mg daily until you get to 10 mg tablet and then continue taking 10 mg tablet daily. (Patient not taking: Reported on 05/29/2016) 42 tablet 0 Completed Course at Unknown time   Scheduled: . buPROPion  150 mg Oral Daily  . docusate sodium  100 mg Oral BID  . enoxaparin (LOVENOX) injection  40 mg Subcutaneous Q24H  . feeding supplement (ENSURE ENLIVE)  237 mL Oral Q24H  . guaiFENesin  600 mg Oral BID  . insulin aspart  0-15 Units Subcutaneous TID WC  . insulin aspart  0-5 Units Subcutaneous QHS  . ipratropium-albuterol  3 mL Nebulization Q6H  . methylPREDNISolone (SOLU-MEDROL) injection  60 mg Intravenous Q12H  . pantoprazole  80 mg Oral Daily  . sertraline  100 mg Oral Daily   Continuous:  [id number] **OR** acetaminophen, albuterol, ALPRAZolam, nystatin, ondansetron **OR** ondansetron (ZOFRAN) IV  Assesment: She has COPD exacerbation and acute on chronic hypoxic respiratory failure. Although she says she feels better she is clearly not ready for discharge. Principal Problem:   Acute and chronic respiratory failure with hypoxia (HCC) Active Problems:   Depression   Anemia, chronic disease   Diabetes mellitus with complication (HCC)   COPD (chronic obstructive pulmonary disease) ([place])   Falls    Plan: Continue current meds    LOS: 2 days   Marda Breidenbach L 06/09/2016, 8:57 AM

## 2016-06-10 ENCOUNTER — Inpatient Hospital Stay (HOSPITAL_COMMUNITY): Payer: Medicare HMO

## 2016-06-10 ENCOUNTER — Encounter (HOSPITAL_COMMUNITY): Payer: Self-pay | Admitting: Primary Care

## 2016-06-10 ENCOUNTER — Other Ambulatory Visit (HOSPITAL_COMMUNITY): Payer: Medicare HMO

## 2016-06-10 DIAGNOSIS — J9621 Acute and chronic respiratory failure with hypoxia: Secondary | ICD-10-CM

## 2016-06-10 DIAGNOSIS — R092 Respiratory arrest: Secondary | ICD-10-CM

## 2016-06-10 DIAGNOSIS — Z7189 Other specified counseling: Secondary | ICD-10-CM

## 2016-06-10 DIAGNOSIS — Z515 Encounter for palliative care: Secondary | ICD-10-CM

## 2016-06-10 LAB — ECHOCARDIOGRAM COMPLETE
AVLVOTPG: 3 mmHg
CHL CUP DOP CALC LVOT VTI: 14.1 cm
CHL CUP MV DEC (S): 352
CHL CUP TV REG PEAK VELOCITY: 226 cm/s
E decel time: 352 msec
FS: 27 % — AB (ref 28–44)
Height: 66 in
IVS/LV PW RATIO, ED: 1.13
LADIAMINDEX: 1.3 cm/m2
LASIZE: 21 mm
LEFT ATRIUM END SYS DIAM: 21 mm
LV PW d: 11.2 mm — AB (ref 0.6–1.1)
LVOT SV: 49 mL
LVOT area: 3.46 cm2
LVOT diameter: 21 mm
LVOT peak vel: 87.2 cm/s
MVPKAVEL: 86.4 m/s
MVPKEVEL: 62.7 m/s
TRMAXVEL: 226 cm/s
WEIGHTICAEL: 1992.96 [oz_av]

## 2016-06-10 LAB — BLOOD GAS, ARTERIAL
Acid-Base Excess: 6.8 mmol/L — ABNORMAL HIGH (ref 0.0–2.0)
Bicarbonate: 29.8 mmol/L — ABNORMAL HIGH (ref 20.0–28.0)
FIO2: 50
MECHVT: 480 mL
O2 Saturation: 95.3 %
PEEP: 5 cmH2O
Patient temperature: 38.2
RATE: 28 {breaths}/min
Sample type: 21310
pCO2 arterial: 57.7 mmHg — ABNORMAL HIGH (ref 32.0–48.0)
pH, Arterial: 7.366 (ref 7.350–7.450)
pO2, Arterial: 88.1 mmHg (ref 83.0–108.0)

## 2016-06-10 LAB — GLUCOSE, CAPILLARY
GLUCOSE-CAPILLARY: 235 mg/dL — AB (ref 65–99)
GLUCOSE-CAPILLARY: 234 mg/dL — AB (ref 65–99)
Glucose-Capillary: 315 mg/dL — ABNORMAL HIGH (ref 65–99)

## 2016-06-10 LAB — BASIC METABOLIC PANEL
Anion gap: 11 (ref 5–15)
BUN: 51 mg/dL — ABNORMAL HIGH (ref 6–20)
CHLORIDE: 97 mmol/L — AB (ref 101–111)
CO2: 32 mmol/L (ref 22–32)
Calcium: 8.4 mg/dL — ABNORMAL LOW (ref 8.9–10.3)
Creatinine, Ser: 2.36 mg/dL — ABNORMAL HIGH (ref 0.44–1.00)
GFR calc non Af Amer: 19 mL/min — ABNORMAL LOW (ref >60)
GFR, EST AFRICAN AMERICAN: 22 mL/min — AB (ref >60)
Glucose, Bld: 248 mg/dL — ABNORMAL HIGH (ref 65–99)
POTASSIUM: 5 mmol/L (ref 3.5–5.1)
SODIUM: 140 mmol/L (ref 135–145)

## 2016-06-10 LAB — MAGNESIUM: Magnesium: 1.7 mg/dL (ref 1.7–2.4)

## 2016-06-10 LAB — CBC
HEMATOCRIT: 38.1 % (ref 36.0–46.0)
HEMOGLOBIN: 11.6 g/dL — AB (ref 12.0–15.0)
MCH: 29.7 pg (ref 26.0–34.0)
MCHC: 30.4 g/dL (ref 30.0–36.0)
MCV: 97.4 fL (ref 78.0–100.0)
Platelets: 217 10*3/uL (ref 150–400)
RBC: 3.91 MIL/uL (ref 3.87–5.11)
RDW: 13.5 % (ref 11.5–15.5)
WBC: 14.8 10*3/uL — AB (ref 4.0–10.5)

## 2016-06-10 LAB — TROPONIN I: TROPONIN I: 0.2 ng/mL — AB (ref <0.03)

## 2016-06-10 MED ORDER — LEVETIRACETAM IN NACL 1000 MG/100ML IV SOLN
1000.0000 mg | Freq: Two times a day (BID) | INTRAVENOUS | Status: DC
  Filled 2016-06-10 (×3): qty 100

## 2016-06-10 MED ORDER — LORAZEPAM 2 MG/ML IJ SOLN: 1.0000 mg | INTRAMUSCULAR | Status: DC | PRN

## 2016-06-10 MED ORDER — VANCOMYCIN HCL IN DEXTROSE 750-5 MG/150ML-% IV SOLN
750.0000 mg | INTRAVENOUS | Status: DC
  Filled 2016-06-10: qty 150

## 2016-06-10 MED ORDER — ATROPINE SULFATE 1 % OP SOLN
2.0000 [drp] | Freq: Four times a day (QID) | OPHTHALMIC | Status: DC
  Administered 2016-06-10: 2 [drp] via SUBLINGUAL
  Filled 2016-06-10: qty 2

## 2016-06-10 MED ORDER — PERFLUTREN LIPID MICROSPHERE
1.0000 mL | INTRAVENOUS | Status: DC | PRN
  Administered 2016-06-10: 4 mL via INTRAVENOUS
  Filled 2016-06-10: qty 10

## 2016-06-10 MED ORDER — SODIUM CHLORIDE 0.9 % IV SOLN
1000.0000 mg | Freq: Once | INTRAVENOUS | Status: DC
  Filled 2016-06-10: qty 20

## 2016-06-10 MED ORDER — SODIUM CHLORIDE 0.9 % IV SOLN
1000.0000 mg | Freq: Two times a day (BID) | INTRAVENOUS | Status: DC
  Filled 2016-06-10: qty 10

## 2016-06-10 MED FILL — Medication: Qty: 1 | Status: AC

## 2016-06-21 NOTE — Consult Note (Signed)
Consultation Note Date: 06-15-2016   Patient Name: Meredith [name]  DOB: [birth year]  MRN: [id number]  Age / Sex: 76 y.o., female  PCP: Aletha Halim, PA-C Referring Physician: Elmarie Shiley, MD  Reason for Consultation: Establishing goals of care, Terminal Care and Withdrawal of life-sustaining treatment  HPI/Patient Profile: 76 y.o. female  with past medical history of acute on chronic respiratory failure with hypoxia, diabetes, COPD, anemia, files admitted on 05/29/2016 with acute on chronic respiratory failure.   Clinical Assessment and Goals of Care: Mrs. Sigley is resting quietly in bed, she is intubated/ventilated and sedated.  She does not respond to me in any purposeful way. There is no signs and symptoms of distress at this time, she is having low volumes. Meeting with family including daughters Lattie Haw and Coralyn Mark, son Mikki Santee, ultimately grandchildren. They share that their goal is for extubation without reading intubation. Coralyn Mark shares that this is Mrs. Meader 3rd time on the ventilator, and that she said she never wanted this again. Coralyn Mark shares that she thought her mother had completed this paperwork but had not mailed it in.  We discuss Mrs. Misuraca recent decline, her falls.  Children share that she has been unable to do things to give her pleasure over the last few months. They decide to ask debate with no re-intubation. Coralyn Mark states that her hope is that her mother finds release quickly. We talk about what extubation will look like, and how we will manage Ms. Heid symptoms.  Healthcare power of attorney NEXT OF KIN - reasonably available adult children including daughters Lattie Haw and Coralyn Mark, and son Mikki Santee agree for plan of care.    SUMMARY OF RECOMMENDATIONS   terminal wean/extubation, focusing on comfort only.  Code Status/Advance Care Planning:  DNR  Symptom Management:   per  hospitalist  Palliative Prophylaxis:   Frequent Pain Assessment  Additional Recommendations (Limitations, Scope, Preferences):  Full Comfort Care  Psycho-social/Spiritual:   Desire for further Chaplaincy support:no  Additional Recommendations: Grief/Bereavement Support  Prognosis:   Hours - Days  Discharge Planning: Anticipated Hospital Death      Primary Diagnoses: Present on Admission: . COPD (chronic obstructive pulmonary disease) ([place]) . Acute and chronic respiratory failure with hypoxia ([place]) . Anemia, chronic disease . Diabetes mellitus with complication ([place] . Falls . Depression   I have reviewed the medical record, interviewed the patient and family, and examined the patient. The following aspects are pertinent.  Past Medical History:  Diagnosis Date  . Anxiety   . Chronic respiratory failure with hypoxia and hypercapnia (HCC)    Nocturnal BiPAP / 4 L/m at rest & 5 L/m with exertion  . Colon cancer ([place]) 2002   colon cancer  . COPD, very severe ([place])   . DM type 2 (diabetes mellitus, type 2) (HCC)    steroid induced  . Insomnia    Social History   Social History  . Marital status: Widowed    Spouse name: N/A  . Number of children: N/A  . Years of  education: N/A   Occupational History  . retired    Social History Main Topics  . Smoking status: Former Smoker    Packs/day: 2.00    Years: 40.00    Types: Cigarettes    Quit date: 06/21/2008  . Smokeless tobacco: Former Systems developer    Quit date: 09/03/2008  . Alcohol use No  . Drug use: No  . Sexual activity: Not Currently   Other Topics Concern  . None   Social History Narrative   Has a home nurse that visits twice weekly. No pets. No bird or mold exposure. No recent travel.   Family History  Problem Relation Age of Onset  . COPD Mother   . Heart attack Father   . COPD Sister   . COPD Brother   . COPD Sister    Scheduled Meds: Continuous Infusions: . sodium chloride 10 mL/hr at June 27, 2016  1209  . fentaNYL infusion INTRAVENOUS 25 mcg/hr (27-Jun-2016 0945)   PRN Meds:.acetaminophen **OR** acetaminophen, LORazepam Medications Prior to Admission:  Prior to Admission medications   Medication Sig Start Date End Date Taking? Authorizing Provider  albuterol (ACCUNEB) 1.25 MG/3ML nebulizer solution Take 3 mLs by nebulization every 4 (four) hours as needed for shortness of breath. 05/24/16  Yes Historical Provider, MD  albuterol (PROVENTIL HFA;VENTOLIN HFA) 108 (90 Base) MCG/ACT inhaler Inhale 2 puffs into the lungs every 6 (six) hours as needed for wheezing or shortness of breath. 09/11/15  Yes Deneise Lever, MD  ALPRAZolam Duanne Moron) 1 MG tablet Take 1 mg by mouth 3 (three) times daily as needed for anxiety or sleep. Take 1/2 to 1 tablet by mouth three times daily as needed   Yes Historical Provider, MD  buPROPion (WELLBUTRIN SR) 150 MG 12 hr tablet Take 1 tablet by mouth daily. 04/29/16  Yes Historical Provider, MD  glipiZIDE (GLUCOTROL XL) 2.5 MG 24 hr tablet Take 2.5 mg by mouth daily.     Yes Historical Provider, MD  Glycopyrrolate-Formoterol (BEVESPI AEROSPHERE) 9-4.8 MCG/ACT AERO Inhale 2 puffs into the lungs 2 (two) times daily. 04/12/16  Yes Deneise Lever, MD  guaiFENesin (MUCINEX) 600 MG 12 hr tablet Take 600 mg by mouth 2 (two) times daily.    Yes Historical Provider, MD  KLOR-CON M20 20 MEQ tablet Take 1 tablet by mouth daily. 04/29/16  Yes Historical Provider, MD  Multiple Vitamins-Minerals (CENTRUM SILVER ADULT 50+) TABS Take 1 tablet by mouth daily.   Yes Historical Provider, MD  naproxen sodium (ANAPROX) 220 MG tablet Take 220 mg by mouth 2 (two) times daily as needed (pain).   Yes Historical Provider, MD  nystatin (MYCOSTATIN) 100000 UNIT/ML suspension TAKE 5MLS BY MOUTH 4 TIMES A DAY Patient taking differently: Use as directed 5 mLs in the mouth or throat 2 (two) times daily as needed (for thrush).  01/15/15  Yes Deneise Lever, MD  omeprazole (PRILOSEC) 40 MG capsule Take 1  capsule (40 mg total) by mouth daily. 02/09/16  Yes Orson Eva, MD  predniSONE (DELTASONE) 5 MG tablet Take 1-2 tablets (5-10 mg total) by mouth daily. 05/10/16  Yes Deneise Lever, MD  sertraline (ZOLOFT) 100 MG tablet Take 1 tablet by mouth daily. 04/29/16  Yes Historical Provider, MD  SYMBICORT 160-4.5 MCG/ACT inhaler USE 2 PUFFS TWICE DAILY 07/04/15  Yes Historical Provider, MD  triamcinolone ointment (KENALOG) 0.1 % Apply 1 application topically daily as needed (for itching).  12/01/15  Yes Historical Provider, MD  vitamin C (ASCORBIC ACID) 500 MG tablet Take  1,000 mg by mouth daily.    Yes Historical Provider, MD   Allergies  Allergen Reactions  . Morphine     Made high strung and have hallucination   . Tramadol Other (See Comments)    Makes her mean  . Zoloft [Sertraline Hcl] Other (See Comments)    Made her cry a lot   Review of Systems  Unable to perform ROS: Intubated    Physical Exam  Constitutional: No distress.  Obese, intubated sedated, no purposeful communication  HENT:  Head: Normocephalic and atraumatic.  Cardiovascular: Normal rate and regular rhythm.   Pulmonary/Chest:  Intubated/ventilated  Abdominal: Soft. She exhibits no distension.  Obese  Neurological:  Intubated/ventilated, sedated, no purposeful communication  Skin: Skin is warm and dry.  Nursing note and vitals reviewed.   Vital Signs: BP 135/81   Pulse 100   Temp 97.7 F (36.5 C) (Axillary)   Resp (!) 28   Ht 5\' 6"  (1.676 m)   Wt 56.5 kg (124 lb 9 oz)   SpO2 95%   BMI 20.10 kg/m  Pain Assessment: CPOT   Pain Score: 4    SpO2: SpO2: 95 % O2 Device:SpO2: 95 % O2 Flow Rate: .O2 Flow Rate (L/min): 8 L/min  IO: Intake/output summary:  Intake/Output Summary (Last 24 hours) at 07-02-16 1318 Last data filed at Jul 02, 2016 0945  Gross per 24 hour  Intake          2879.34 ml  Output                0 ml  Net          2879.34 ml    LBM: Last BM Date: 06/06/16 Baseline Weight: Weight: 90.7 kg  (200 lb) Most recent weight: Weight: 56.5 kg (124 lb 9 oz)     Palliative Assessment/Data:   Flowsheet Rows   Flowsheet Row Most Recent Value  Intake Tab  Referral Department  Hospitalist  Unit at Time of Referral  ICU  Palliative Care Primary Diagnosis  Pulmonary  Date Notified  06/09/16  Palliative Care Type  New Palliative care  Reason for referral  Clarify Goals of Care, End of Life Care Assistance  Date of Admission  06/06/2016  Date first seen by Palliative Care  07-02-2016  # of days Palliative referral response time  1 Day(s)  # of days IP prior to Palliative referral  2  Clinical Assessment  Palliative Performance Scale Score  10%  Pain Max last 24 hours  Not able to report  Pain Min Last 24 hours  Not able to report  Dyspnea Max Last 24 Hours  Not able to report  Dyspnea Min Last 24 hours  Not able to report  Psychosocial & Spiritual Assessment  Palliative Care Outcomes  Patient/Family meeting held?  Yes  Who was at the meeting?  daughters Lattie Haw and Karna Christmas and extended family  Palliative Care Outcomes  Provided advance care planning, Provided end of life care assistance, Provided psychosocial or spiritual support  Patient/Family wishes: Interventions discontinued/not started   Mechanical Ventilation  Palliative Care follow-up planned  -- [follow up while at [facility]]      Time In: 1105 Time Out: 1155 Time Total: 50 minutes Greater than 50%  of this time was spent counseling and coordinating care related to the above assessment and plan.  Signed by: Drue Novel, NP   Please contact Palliative Medicine Team phone at [phone] for questions and concerns.  For individual provider: See Shea Evans

## 2016-06-21 NOTE — Progress Notes (Signed)
*  PRELIMINARY RESULTS* Echocardiogram 2D Echocardiogram has been performed with Definity.  Meredith [name] 06/19/2016, 11:14 AM

## 2016-06-21 NOTE — Progress Notes (Signed)
Patient expired at 1538, family at bedside. MD notified.

## 2016-06-21 NOTE — Progress Notes (Addendum)
PROGRESS NOTE    Meredith [name]  [id number] DOB: [birth year] DOA: 06/09/2016 PCP: Tula Nakayama    Brief Narrative: Meredith [name] is a 76 y.o. 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   Patient admitted with frequent, confusion, worsening Hypoxemia. She was treated for COPD exacerbation with IV solumedrol, nebulizer treatments. She was requiring higher amount of oxygen. Patient on 12- 20 afternoon was found unresponsive by nurse. Code blue was called. Patient was in PEA arrest. She received 3 epinephrine, was intubated, ROSC was obtain with ST 150 and SBP 130. She was transfer to ICU. Patient became hypotensive, she was started on Levophed.   Overnight patient started to have seizure like activities. She has received ativan, keppra. Phenytoin ordered. Patient has also develops renal failure.   Care discussed with daughter and other family member. Explain to them poor prognosis and low probability of functional recovery. They are leaning toward comfort. They are waiting to speak with rest of family./ Palliative care also consulted.   Addendum;  Discussed with rest of family about goals of care. They have decide to transition to comfort care. They would like to proceed with terminal weaning around 1 PM. Will consult Palliative to help with process.   Assessment & Plan:   Principal Problem:   Acute and chronic respiratory failure with hypoxia (HCC) Active Problems:   Depression   Anemia, chronic disease   Diabetes mellitus with complication (HCC)   COPD (chronic obstructive pulmonary disease) (HCC)   Falls  1-Acute on chronic hypoxic respiratory failure, secondary to COPD exacerbation. Respiratory Arrest; Patient now on ventilatory support, after respiratory arrest.  Continue with  nebulizer schedule, IV solumedrol.  Patient with seizure like activity, receiving ativan, Keppra, Phenytoin added.  On IV antibiotics, Vancomycin and Zosyn.  Appreciate Dr Luan Pulling evaluation and recommenda  2-Acute renal failure;  In setting of hypotension, shock.  Continue with support care, IV fluids.    3-Mild elevation of troponin; EKG with no evidence of ischemia.   4-Shock; covering for infections process. ECHO was ordered.  On Levophed. Has central line.  On IV antibiotics.   5-neuro;  Patient with twitching. May be from neurological damage, hypoxemia.  Discussed with neurology, poor prognosis. Continue with Keppra, hold ativan.   Frequent fall;  PT consulted.   -DM;  SSI. Hold glucotrol.   -Depression;  Was on Wellbutrin, Xanax, and Zoloft    DVT prophylaxis: Lovenox.  Code Status: full code.  Family Communication: multiples family members.  Disposition Plan: poor prognosis, expect hospital death.   Consultants:   Dr Luan Pulling.    Procedures: none   Antimicrobials:  none  Subjective: On Vent support. Twitching muscle face.    Objective: Vitals:   Jul 04, 2016 0333 July 04, 2016 0500 July 04, 2016 0600 04-Jul-2016 0739  BP: (!) 178/92 (!) 164/87 139/78   Pulse: (!) 102 (!) 111 (!) 128   Resp: (!) 31 (!) 23 (!) 28   Temp: (!) 100.9 F (38.3 C) 98.5 F (36.9 C)    TempSrc:  Axillary    SpO2: 95% 95% 96% 97%  Weight:      Height:        Intake/Output Summary (Last 24 hours) at 2016-07-04 0748 Last data filed at 07-04-16 0600  Gross per 24 hour  Intake          2637.83 ml  Output                0 ml  Net          2637.83 ml   Filed Weights   06/02/2016 1254 05/28/2016 1710 06/09/16 0648  Weight: 90.7 kg (200 lb) 58 kg (127 lb 12.8 oz) 56.5 kg (124 lb 9 oz)    Examination:  General exam: intubated.  Respiratory system:   Decrease breath sounds. Intubated.  Cardiovascular system: S1 & S2 heard, RRR. No JVD, murmurs, rubs, gallops or clicks.  Gastrointestinal  system: Abdomen is nondistended, soft and nontender. No organomegaly or masses felt. Normal bowel sounds heard. Central nervous system: non responsive, intubated, twitching muscle faces.  Extremities: trace edema Skin: No rashes, lesions or ulcers    Data Reviewed: I have personally reviewed following labs and imaging studies  CBC:  Recent Labs Lab 05/26/2016 1311 06/08/16 0604 06/09/16 1630 2016/06/13 0412  WBC 8.2 5.8 10.2 14.8*  NEUTROABS 6.3  --   --   --   HGB 11.9* 11.9* 10.5* 11.6*  HCT 40.6 39.4 38.1 38.1  MCV 100.5* 98.7 103.5* 97.4  PLT 153 177 200 [id number]   Basic Metabolic Panel:  Recent Labs Lab 06/06/2016 1311 06/08/16 0604 06/09/16 1630 06/09/16 1631 06/09/16 2105 2016/06/13 0412  NA 138 139 139  --  139 140  K 4.3 4.4 5.9*  --  4.5 5.0  CL 93* 90* 93*  --  98* 97*  CO2 38* 43* 31  --  33* 32  GLUCOSE 150* 224* 354*  --  313* 248*  BUN 15 17 29*  --  41* 51*  CREATININE 0.94 0.94 1.47*  --  1.59* 2.36*  CALCIUM 8.9 9.2 8.5*  --  8.4* 8.4*  MG  --   --   --  1.8  --  1.7   GFR: Estimated Creatinine Clearance: 18.9 mL/min (by C-G formula based on SCr of 2.36 mg/dL (H)). Liver Function Tests:  Recent Labs Lab 05/21/2016 1311  AST 25  ALT 19  ALKPHOS 56  BILITOT 0.6  PROT 6.6  ALBUMIN 3.7   No results for input(s): LIPASE, AMYLASE in the last 168 hours. No results for input(s): AMMONIA in the last 168 hours. Coagulation Profile: No results for input(s): INR, PROTIME in the last 168 hours. Cardiac Enzymes:  Recent Labs Lab 06/09/16 1630 06/09/16 2218 06-13-16 0412  TROPONINI 0.03* 0.10* 0.20*   BNP (last 3 results) No results for input(s): PROBNP in the last 8760 hours. HbA1C: No results for input(s): HGBA1C in the last 72 hours. CBG:  Recent Labs Lab 06/09/16 1622 06/09/16 1938 06-13-16 0036 Jun 13, 2016 0338 13-Jun-2016 0745  GLUCAP 245* 199* 315* 235* 234*   Lipid Profile: No results for input(s): CHOL, HDL, LDLCALC, TRIG, CHOLHDL,  LDLDIRECT in the last 72 hours. Thyroid Function Tests: No results for input(s): TSH, T4TOTAL, FREET4, T3FREE, THYROIDAB in the last 72 hours. Anemia Panel: No results for input(s): VITAMINB12, FOLATE, FERRITIN, TIBC, IRON, RETICCTPCT in the last 72 hours. Sepsis Labs:  Recent Labs Lab 06/09/16 1821 06/09/16 2105  LATICACIDVEN 4.3* 2.2*    No results found for this or any previous visit (from the past 240 hour(s)).       Radiology Studies: Dg Chest Port 1 View  Result Date: 2016/06/13 CLINICAL DATA:  Respiratory failure EXAM: PORTABLE CHEST 1 VIEW COMPARISON:  06/09/2016 FINDINGS: Endotracheal tube, nasogastric catheter and right jugular central line are again seen and stable. Cardiac shadow is within normal  limits. The lungs are hyperinflated. There is some improved aeration in the right base. No new focal infiltrate is seen. No bony abnormality is noted. IMPRESSION: Improved aeration in the right lung base. Changes of COPD. Electronically Signed   By: Inez Catalina M.D.   On: 2016-07-05 07:24   Dg Chest Port 1 View  Result Date: 06/09/2016 CLINICAL DATA:  Respiratory failure, post code, evaluate endotracheal tube positioning. EXAM: PORTABLE CHEST 1 VIEW COMPARISON:  PA and lateral chest x-ray of June 07, 2016 FINDINGS: The images are technically limited due to positioning and technical factors. The endotracheal tube tip projects approximately 2 cm above the poorly defined carina. The lungs are well-expanded where visualized. External pacemaker defibrillator pads are present. The heart is not enlarged. The interstitial markings are mildly prominent. There is an esophagogastric tube present whose tip projects below the inferior margin of the image. IMPRESSION: The endotracheal tube tip projects approximately 2 cm above what is felt to reflect the carina. A well-positioned portable chest x-ray would be useful when the patient is stable. Electronically Signed   By: David  Martinique M.D.    On: 06/09/2016 17:06   Dg Chest Port 1v Same Day  Result Date: 06/09/2016 CLINICAL DATA:  Right central line placement EXAM: PORTABLE CHEST 1 VIEW COMPARISON:  06/09/2016 FINDINGS: Cardiomediastinal silhouette is stable. Stable endotracheal and NG tube position. There is right IJ central line with tip in SVC. No pneumothorax. Right basilar atelectasis or infiltrate. IMPRESSION: Right IJ central line with tip in SVC. No pneumothorax. Right basilar atelectasis or infiltrate. Electronically Signed   By: Lahoma Crocker M.D.   On: 06/09/2016 17:22   Dg Abd Portable 1v  Result Date: 06/09/2016 CLINICAL DATA:  Post code, respiratory failure. Evaluate orogastric tube placement. EXAM: PORTABLE ABDOMEN - 1 VIEW COMPARISON:  Portable chest x-ray of today's date. FINDINGS: The orogastric tube tip in proximal port project below the level of the GE junction. The tip lies in the region of the mid gastric body. The bowel gas pattern is unremarkable. IMPRESSION: Orogastric tube tip projecting in the region of the mid gastric body. Electronically Signed   By: David  Martinique M.D.   On: 06/09/2016 17:07        Scheduled Meds: . docusate sodium  100 mg Oral BID  . enoxaparin (LOVENOX) injection  40 mg Subcutaneous Q24H  . feeding supplement (ENSURE ENLIVE)  237 mL Oral Q24H  . insulin aspart  0-9 Units Subcutaneous TID WC  . ipratropium-albuterol  3 mL Nebulization Q6H WA  . levETIRAcetam  1,000 mg Intravenous Q12H  . methylPREDNISolone (SOLU-MEDROL) injection  60 mg Intravenous Q6H  . pantoprazole (PROTONIX) IV  40 mg Intravenous Q12H  . piperacillin-tazobactam (ZOSYN)  IV  3.375 g Intravenous Q8H  . vancomycin  500 mg Intravenous Q24H   Continuous Infusions: . sodium chloride 75 mL/hr at 07-05-16 0600  . fentaNYL infusion INTRAVENOUS 25 mcg/hr (05-Jul-2016 0600)  . norepinephrine (LEVOPHED) Adult infusion 2 mcg/min (2016-07-05 0600)     LOS: 3 days    Time spent: 35 minutes.     Elmarie Shiley,  MD [facility] Pager [phone]  If 7PM-7AM, please contact night-coverage [email or web address] Password [email or web address] 2016/07/05, 7:48 AM

## 2016-06-21 NOTE — Progress Notes (Signed)
Wasted 175 cc fentanyl with Loni Muse, RN in med-room sink.

## 2016-06-21 NOTE — Progress Notes (Signed)
Phys made aware of continued seizure like activity Phys has visited room

## 2016-06-21 NOTE — Progress Notes (Signed)
Subjective: Events of yesterday noted. She is now in ICU intubated has no CODE STATUS and has been having significant twitching. She has received Keppra and Ativan. She is on levo fed and that is being tapered.  Objective: Vital signs in last 24 hours: Temp:  [97.6 F (36.4 C)-100.9 F (38.3 C)] 98.5 F (36.9 C) (12/21 0500) Pulse Rate:  [82-128] 128 (12/21 0600) Resp:  [14-31] 28 (12/21 0600) BP: (56-178)/(15-113) 139/78 (12/21 0600) SpO2:  [90 %-100 %] 97 % (12/21 0739) FiO2 (%):  [40 %-100 %] 50 % (12/21 0739) Weight change:  Last BM Date: 06/06/16  Intake/Output from previous day: 12/20 0701 - 12/21 0700 In: 2637.8 [I.V.:1267.8; IV Piggyback:1370] Out: -   PHYSICAL EXAM General appearance: Intubated sedated on mechanical ventilation with twitching of her face almost constantly Resp: rhonchi bilaterally Cardio: regular rate and rhythm, S1, S2 normal, no murmur, click, rub or gallop GI: soft, non-tender; bowel sounds normal; no masses,  no organomegaly Extremities: extremities normal, atraumatic, no cyanosis or edema Skin warm and dry. Pupils very sluggish. Mucous membranes moist  Lab Results:  Results for orders placed or performed during the hospital encounter of 06/14/2016 (from the past 48 hour(s))  Glucose, capillary     Status: Abnormal   Collection Time: 06/08/16  7:52 AM  Result Value Ref Range   Glucose-Capillary 218 (H) 65 - 99 mg/dL  Glucose, capillary     Status: Abnormal   Collection Time: 06/08/16 11:05 AM  Result Value Ref Range   Glucose-Capillary 254 (H) 65 - 99 mg/dL  Glucose, capillary     Status: Abnormal   Collection Time: 06/08/16  4:27 PM  Result Value Ref Range   Glucose-Capillary 240 (H) 65 - 99 mg/dL  Glucose, capillary     Status: Abnormal   Collection Time: 06/08/16  9:21 PM  Result Value Ref Range   Glucose-Capillary 205 (H) 65 - 99 mg/dL   Comment 1 Notify RN    Comment 2 Document in Chart   Glucose, capillary     Status: Abnormal   Collection Time: 06/09/16  7:17 AM  Result Value Ref Range   Glucose-Capillary 191 (H) 65 - 99 mg/dL  Glucose, capillary     Status: Abnormal   Collection Time: 06/09/16 11:45 AM  Result Value Ref Range   Glucose-Capillary 140 (H) 65 - 99 mg/dL  Glucose, capillary     Status: Abnormal   Collection Time: 06/09/16  4:01 PM  Result Value Ref Range   Glucose-Capillary 158 (H) 65 - 99 mg/dL  Glucose, capillary     Status: Abnormal   Collection Time: 06/09/16  4:22 PM  Result Value Ref Range   Glucose-Capillary 245 (H) 65 - 99 mg/dL  CBC     Status: Abnormal   Collection Time: 06/09/16  4:30 PM  Result Value Ref Range   WBC 10.2 4.0 - 10.5 K/uL   RBC 3.68 (L) 3.87 - 5.11 MIL/uL   Hemoglobin 10.5 (L) 12.0 - 15.0 g/dL   HCT 38.1 36.0 - 46.0 %   MCV 103.5 (H) 78.0 - 100.0 fL   MCH 28.5 26.0 - 34.0 pg   MCHC 27.6 (L) 30.0 - 36.0 g/dL   RDW 12.8 11.5 - 15.5 %   Platelets 200 150 - 400 K/uL  Basic metabolic panel     Status: Abnormal   Collection Time: 06/09/16  4:30 PM  Result Value Ref Range   Sodium 139 135 - 145 mmol/L   Potassium 5.9 (H)  3.5 - 5.1 mmol/L    Comment: CRITICAL RESULT CALLED TO, READ BACK BY AND VERIFIED WITH: COCKRAM,C AT 1723 ON 12.20.17 BY BAYSE,L    Chloride 93 (L) 101 - 111 mmol/L   CO2 31 22 - 32 mmol/L   Glucose, Bld 354 (H) 65 - 99 mg/dL   BUN 29 (H) 6 - 20 mg/dL   Creatinine, Ser 1.47 (H) 0.44 - 1.00 mg/dL   Calcium 8.5 (L) 8.9 - 10.3 mg/dL   GFR calc non Af Amer 34 (L) >60 mL/min   GFR calc Af Amer 40 (L) >60 mL/min    Comment: (NOTE) The eGFR has been calculated using the CKD EPI equation. This calculation has not been validated in all clinical situations. eGFR's persistently <60 mL/min signify possible Chronic Kidney Disease.    Anion gap 15 5 - 15  Troponin I (q 6hr x 3)     Status: Abnormal   Collection Time: 06/09/16  4:30 PM  Result Value Ref Range   Troponin I 0.03 (HH) <0.03 ng/mL    Comment: CRITICAL RESULT CALLED TO, READ BACK BY AND  VERIFIED WITH: COCKRAM,C AT 1723 ON 12.20.17 BY BAYSE,L   Magnesium     Status: None   Collection Time: 06/09/16  4:31 PM  Result Value Ref Range   Magnesium 1.8 1.7 - 2.4 mg/dL  Blood gas, arterial     Status: Abnormal   Collection Time: 06/09/16  5:05 PM  Result Value Ref Range   FIO2 1.00    Delivery systems VENTILATOR    Mode PRESSURE REGULATED VOLUME CONTROL    VT 500 mL   LHR 18.0 resp/min   Peep/cpap 5.0 cm H20   pH, Arterial 7.260 (L) 7.350 - 7.450   pCO2 arterial 65.6 (HH) 32.0 - 48.0 mmHg    Comment: CRITICAL RESULT CALLED TO, READ BACK BY AND VERIFIED WITH: LAWARENCE HYLTON,RN AT 1708 BY WENDY VIA,RRT,RCP ON 06/09/2016    pO2, Arterial 219 (H) 83.0 - 108.0 mmHg   Bicarbonate 25.0 20.0 - 28.0 mmol/L   Acid-Base Excess 2.0 0.0 - 2.0 mmol/L   O2 Saturation 99.0 %   Patient temperature 37.0    Collection site LEFT RADIAL    Drawn by [id number]    Sample type ARTERIAL DRAW    Allens test (pass/fail) PASS PASS  Lactic acid, plasma     Status: Abnormal   Collection Time: 06/09/16  6:21 PM  Result Value Ref Range   Lactic Acid, Venous 4.3 (HH) 0.5 - 1.9 mmol/L    Comment: CRITICAL RESULT CALLED TO, READ BACK BY AND VERIFIED WITH: HOWARD,C AT 2000 ON 06/09/2016 BY ISLEY,B   Glucose, capillary     Status: Abnormal   Collection Time: 06/09/16  7:38 PM  Result Value Ref Range   Glucose-Capillary 199 (H) 65 - 99 mg/dL   Comment 1 Notify RN    Comment 2 Document in Chart   Blood gas, arterial     Status: Abnormal   Collection Time: 06/09/16  9:00 PM  Result Value Ref Range   FIO2 40.00    Delivery systems VENTILATOR    Mode PRESSURE REGULATED VOLUME CONTROL    VT 480 mL   LHR 28 resp/min   Peep/cpap 5.0 cm H20   pH, Arterial 7.305 (L) 7.350 - 7.450   pCO2 arterial 65.1 (HH) 32.0 - 48.0 mmHg    Comment: CRITICAL RESULT CALLED TO, READ BACK BY AND VERIFIED WITH: MCDANIEL J RN AT 2115 06/09/16 PITTMAN S RRT/RCP  pO2, Arterial 60.0 (L) 83.0 - 108.0 mmHg   Bicarbonate  27.6 20.0 - 28.0 mmol/L   Acid-Base Excess 5.4 (H) 0.0 - 2.0 mmol/L   O2 Saturation 88.6 %   Patient temperature 37.0    Collection site LEFT RADIAL    Drawn by [id number]    Sample type ARTERIAL    Allens test (pass/fail) PASS PASS  Lactic acid, plasma     Status: Abnormal   Collection Time: 06/09/16  9:05 PM  Result Value Ref Range   Lactic Acid, Venous 2.2 (HH) 0.5 - 1.9 mmol/L    Comment: CRITICAL RESULT CALLED TO, READ BACK BY AND VERIFIED WITH: HOWARD,C AT 2230 ON 06/09/2016 BY ISLEY,B   Basic metabolic panel     Status: Abnormal   Collection Time: 06/09/16  9:05 PM  Result Value Ref Range   Sodium 139 135 - 145 mmol/L   Potassium 4.5 3.5 - 5.1 mmol/L    Comment: DELTA CHECK NOTED   Chloride 98 (L) 101 - 111 mmol/L   CO2 33 (H) 22 - 32 mmol/L   Glucose, Bld 313 (H) 65 - 99 mg/dL   BUN 41 (H) 6 - 20 mg/dL   Creatinine, Ser 1.59 (H) 0.44 - 1.00 mg/dL   Calcium 8.4 (L) 8.9 - 10.3 mg/dL   GFR calc non Af Amer 31 (L) >60 mL/min   GFR calc Af Amer 36 (L) >60 mL/min    Comment: (NOTE) The eGFR has been calculated using the CKD EPI equation. This calculation has not been validated in all clinical situations. eGFR's persistently <60 mL/min signify possible Chronic Kidney Disease.    Anion gap 8 5 - 15  Troponin I (q 6hr x 3)     Status: Abnormal   Collection Time: 06/09/16 10:18 PM  Result Value Ref Range   Troponin I 0.10 (HH) <0.03 ng/mL    Comment: CRITICAL VALUE NOTED.  VALUE IS CONSISTENT WITH PREVIOUSLY REPORTED AND CALLED VALUE.  Glucose, capillary     Status: Abnormal   Collection Time: June 15, 2016 12:36 AM  Result Value Ref Range   Glucose-Capillary 315 (H) 65 - 99 mg/dL   Comment 1 Notify RN    Comment 2 Document in Chart   Glucose, capillary     Status: Abnormal   Collection Time: Jun 15, 2016  3:38 AM  Result Value Ref Range   Glucose-Capillary 235 (H) 65 - 99 mg/dL  Basic metabolic panel     Status: Abnormal   Collection Time: 06/15/16  4:12 AM  Result Value Ref  Range   Sodium 140 135 - 145 mmol/L   Potassium 5.0 3.5 - 5.1 mmol/L   Chloride 97 (L) 101 - 111 mmol/L   CO2 32 22 - 32 mmol/L   Glucose, Bld 248 (H) 65 - 99 mg/dL   BUN 51 (H) 6 - 20 mg/dL   Creatinine, Ser 2.36 (H) 0.44 - 1.00 mg/dL   Calcium 8.4 (L) 8.9 - 10.3 mg/dL   GFR calc non Af Amer 19 (L) >60 mL/min   GFR calc Af Amer 22 (L) >60 mL/min    Comment: (NOTE) The eGFR has been calculated using the CKD EPI equation. This calculation has not been validated in all clinical situations. eGFR's persistently <60 mL/min signify possible Chronic Kidney Disease.    Anion gap 11 5 - 15  CBC     Status: Abnormal   Collection Time: 06/15/2016  4:12 AM  Result Value Ref Range   WBC 14.8 (H) 4.0 -  10.5 K/uL   RBC 3.91 3.87 - 5.11 MIL/uL   Hemoglobin 11.6 (L) 12.0 - 15.0 g/dL   HCT 38.1 36.0 - 46.0 %   MCV 97.4 78.0 - 100.0 fL   MCH 29.7 26.0 - 34.0 pg   MCHC 30.4 30.0 - 36.0 g/dL   RDW 13.5 11.5 - 15.5 %   Platelets 217 150 - 400 K/uL  Magnesium     Status: None   Collection Time: July 03, 2016  4:12 AM  Result Value Ref Range   Magnesium 1.7 1.7 - 2.4 mg/dL  Troponin I     Status: Abnormal   Collection Time: July 03, 2016  4:12 AM  Result Value Ref Range   Troponin I 0.20 (HH) <0.03 ng/mL    Comment: CRITICAL RESULT CALLED TO, READ BACK BY AND VERIFIED WITH: DANIELS,J AT 5:50AM ON 07-03-16 BY FESTERMAN,C   Blood gas, arterial     Status: Abnormal   Collection Time: July 03, 2016  5:35 AM  Result Value Ref Range   FIO2 50.00    Delivery systems VENTILATOR    Mode PRESSURE REGULATED VOLUME CONTROL    VT 480 mL   LHR 28 resp/min   Peep/cpap 5.0 cm H20   pH, Arterial 7.366 7.350 - 7.450   pCO2 arterial 57.7 (H) 32.0 - 48.0 mmHg   pO2, Arterial 88.1 83.0 - 108.0 mmHg   Bicarbonate 29.8 (H) 20.0 - 28.0 mmol/L   Acid-Base Excess 6.8 (H) 0.0 - 2.0 mmol/L   O2 Saturation 95.3 %   Patient temperature 38.2    Collection site LEFT RADIAL    Drawn by ARTERIAL    Sample type 21,310    Allens  test (pass/fail) PASS PASS    ABGS  Recent Labs  03-Jul-2016 0535  PHART 7.366  PO2ART 88.1  HCO3 29.8*   CULTURES No results found for this or any previous visit (from the past 240 hour(s)). Studies/Results: Dg Chest Port 1 View  Result Date: 07-03-2016 CLINICAL DATA:  Respiratory failure EXAM: PORTABLE CHEST 1 VIEW COMPARISON:  06/09/2016 FINDINGS: Endotracheal tube, nasogastric catheter and right jugular central line are again seen and stable. Cardiac shadow is within normal limits. The lungs are hyperinflated. There is some improved aeration in the right base. No new focal infiltrate is seen. No bony abnormality is noted. IMPRESSION: Improved aeration in the right lung base. Changes of COPD. Electronically Signed   By: Inez Catalina M.D.   On: 07/03/2016 07:24   Dg Chest Port 1 View  Result Date: 06/09/2016 CLINICAL DATA:  Respiratory failure, post code, evaluate endotracheal tube positioning. EXAM: PORTABLE CHEST 1 VIEW COMPARISON:  PA and lateral chest x-ray of June 07, 2016 FINDINGS: The images are technically limited due to positioning and technical factors. The endotracheal tube tip projects approximately 2 cm above the poorly defined carina. The lungs are well-expanded where visualized. External pacemaker defibrillator pads are present. The heart is not enlarged. The interstitial markings are mildly prominent. There is an esophagogastric tube present whose tip projects below the inferior margin of the image. IMPRESSION: The endotracheal tube tip projects approximately 2 cm above what is felt to reflect the carina. A well-positioned portable chest x-ray would be useful when the patient is stable. Electronically Signed   By: David  Martinique M.D.   On: 06/09/2016 17:06   Dg Chest Port 1v Same Day  Result Date: 06/09/2016 CLINICAL DATA:  Right central line placement EXAM: PORTABLE CHEST 1 VIEW COMPARISON:  06/09/2016 FINDINGS: Cardiomediastinal silhouette is stable. Stable  endotracheal and NG tube position. There is right IJ central line with tip in SVC. No pneumothorax. Right basilar atelectasis or infiltrate. IMPRESSION: Right IJ central line with tip in SVC. No pneumothorax. Right basilar atelectasis or infiltrate. Electronically Signed   By: Lahoma Crocker M.D.   On: 06/09/2016 17:22   Dg Abd Portable 1v  Result Date: 06/09/2016 CLINICAL DATA:  Post code, respiratory failure. Evaluate orogastric tube placement. EXAM: PORTABLE ABDOMEN - 1 VIEW COMPARISON:  Portable chest x-ray of today's date. FINDINGS: The orogastric tube tip in proximal port project below the level of the GE junction. The tip lies in the region of the mid gastric body. The bowel gas pattern is unremarkable. IMPRESSION: Orogastric tube tip projecting in the region of the mid gastric body. Electronically Signed   By: David  Martinique M.D.   On: 06/09/2016 17:07    Medications:  Prior to Admission:  Prescriptions Prior to Admission  Medication Sig Dispense Refill Last Dose  . albuterol (ACCUNEB) 1.25 MG/3ML nebulizer solution Take 3 mLs by nebulization every 4 (four) hours as needed for shortness of breath.   05/24/2016 at Unknown time  . albuterol (PROVENTIL HFA;VENTOLIN HFA) 108 (90 Base) MCG/ACT inhaler Inhale 2 puffs into the lungs every 6 (six) hours as needed for wheezing or shortness of breath. 1 Inhaler 5 unknown  . ALPRAZolam (XANAX) 1 MG tablet Take 1 mg by mouth 3 (three) times daily as needed for anxiety or sleep. Take 1/2 to 1 tablet by mouth three times daily as needed   06/06/2016 at Unknown time  . buPROPion (WELLBUTRIN SR) 150 MG 12 hr tablet Take 1 tablet by mouth daily.  5 06/06/2016 at Unknown time  . glipiZIDE (GLUCOTROL XL) 2.5 MG 24 hr tablet Take 2.5 mg by mouth daily.     06/06/2016 at Unknown time  . Glycopyrrolate-Formoterol (BEVESPI AEROSPHERE) 9-4.8 MCG/ACT AERO Inhale 2 puffs into the lungs 2 (two) times daily. 1 Inhaler 12 06/06/2016 at Unknown time  . guaiFENesin (MUCINEX)  600 MG 12 hr tablet Take 600 mg by mouth 2 (two) times daily.    06/06/2016 at Unknown time  . KLOR-CON M20 20 MEQ tablet Take 1 tablet by mouth daily.  5 06/06/2016 at Unknown time  . Multiple Vitamins-Minerals (CENTRUM SILVER ADULT 50+) TABS Take 1 tablet by mouth daily.   06/06/2016 at Unknown time  . naproxen sodium (ANAPROX) 220 MG tablet Take 220 mg by mouth 2 (two) times daily as needed (pain).   06/06/2016 at Unknown time  . nystatin (MYCOSTATIN) 100000 UNIT/ML suspension TAKE 5MLS BY MOUTH 4 TIMES A DAY (Patient taking differently: Use as directed 5 mLs in the mouth or throat 2 (two) times daily as needed (for thrush). ) 360 mL 0 unknown  . omeprazole (PRILOSEC) 40 MG capsule Take 1 capsule (40 mg total) by mouth daily. 30 capsule 0 06/06/2016 at Unknown time  . predniSONE (DELTASONE) 5 MG tablet Take 1-2 tablets (5-10 mg total) by mouth daily. 60 tablet 0 06/06/2016 at Unknown time  . sertraline (ZOLOFT) 100 MG tablet Take 1 tablet by mouth daily.  5 06/06/2016 at Unknown time  . SYMBICORT 160-4.5 MCG/ACT inhaler USE 2 PUFFS TWICE DAILY  1 06/06/2016 at Unknown time  . triamcinolone ointment (KENALOG) 0.1 % Apply 1 application topically daily as needed (for itching).    unknown  . vitamin C (ASCORBIC ACID) 500 MG tablet Take 1,000 mg by mouth daily.    06/06/2016 at Unknown time  . [DISCONTINUED]  predniSONE (DELTASONE) 10 MG tablet Take 60 mg tablet 03/07/2016 and taper down by 10 mg daily until you get to 10 mg tablet and then continue taking 10 mg tablet daily. (Patient not taking: Reported on 05/27/2016) 42 tablet 0 Completed Course at Unknown time   Scheduled: . docusate sodium  100 mg Oral BID  . enoxaparin (LOVENOX) injection  40 mg Subcutaneous Q24H  . feeding supplement (ENSURE ENLIVE)  237 mL Oral Q24H  . insulin aspart  0-9 Units Subcutaneous TID WC  . ipratropium-albuterol  3 mL Nebulization Q6H WA  . levETIRAcetam  1,000 mg Intravenous Q12H  . methylPREDNISolone (SOLU-MEDROL)  injection  60 mg Intravenous Q6H  . pantoprazole (PROTONIX) IV  40 mg Intravenous Q12H  . phenytoin (DILANTIN) IV  1,000 mg Intravenous Once  . piperacillin-tazobactam (ZOSYN)  IV  3.375 g Intravenous Q8H  . vancomycin  500 mg Intravenous Q24H   Continuous: . sodium chloride 75 mL/hr at 06-15-2016 0600  . fentaNYL infusion INTRAVENOUS 25 mcg/hr (2016/06/15 0600)  . norepinephrine (LEVOPHED) Adult infusion 2 mcg/min (06/15/16 0600)   SLH:[id number] **OR** acetaminophen, albuterol, ALPRAZolam, fentaNYL, LORazepam, midazolam, midazolam, nystatin, ondansetron **OR** ondansetron (ZOFRAN) IV  Assesment: She was admitted with COPD exacerbation and acute on chronic hypoxic respiratory failure. She had CODE BLUE yesterday is on mechanical ventilation now. She is twitching and I think probably has had a significant neurological injury from hypoxia. I discussed all this with 3 family members in the ICU waiting room. We discussed her situation at home before this happened and apparently her quality of life was not good. She was depressed was not able to do what she wanted to do was very short of breath with any activity. They have interested in discussion of terminal weaning. I told them the process. They want to wait on that decision until other family members are present. In the meantime it would be reasonable to get EEG and neurology consultation if available but I don't know that the results of that would necessarily change treatment plan considering her prehospitalization status. Reasonable to have palliative care involved. Principal Problem:   Acute and chronic respiratory failure with hypoxia (HCC) Active Problems:   Depression   Anemia, chronic disease   Diabetes mellitus with complication (HCC)   COPD (chronic obstructive pulmonary disease) ([place])   Falls    Plan: As above. Would plan to go ahead with EEG and neurology consultation if requested from family. If they want to go ahead with terminal  weaning at this time that I think that's appropriate also.    LOS: 3 days   Phillis Thackeray L 06-15-16, 7:47 AM

## 2016-06-21 NOTE — Discharge Summary (Signed)
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 She was treated for COPD exacerbation with IV solumedrol, nebulizer treatments. She was requiring higher amount of oxygen. Patient on 12- 20 afternoon was found unresponsive by nurse. Code blue was called. Patient was in PEA arrest. She received 3 epinephrine, was intubated, ROSC was obtain with ST 150 and SBP 130. She was transfer to ICU. Patient became hypotensive, she was started on Levophed.   Overnight patient started to have seizure like activities, twitching of muscles of the face. She has received ativan, keppra.  Patient has also develops renal failure. She was also started on IV antibiotics.   Care discussed with daughter and other family members. Explain to them poor prognosis and low probability of functional recovery. Discussed with them neurology recommendation. Family has decided on full comfort care and terminal weaning. Palliative consulted for family support and terminal weaning. Patient was extubated and patient was pronounced death at 15;38.    Assessment & Plan: 1-Acute on chronic hypoxic respiratory failure, secondary to COPD exacerbation. Respiratory  Arrest; Patient now on ventilatory support, after respiratory arrest.  Continue with nebulizer schedule, IV solumedrol.  Patient with seizure like activity, receiving ativan, Keppra, Phenytoin added.  On IV antibiotics, Vancomycin and Zosyn.  Appreciate Dr Luan Pulling evaluation and recommenda  2-Acute renal failure;  In setting of hypotension, shock.  Continue with support care, IV fluids.    3-Mild elevation of troponin; EKG with no evidence of ischemia.   4-Shock; covering for infections process. ECHO was ordered.  On Levophed. Has central line.  On IV antibiotics.   5-neuro;  Patient with twitching. May be from neurological damage, hypoxemia.  Discussed with neurology, poor prognosis. Continue with Keppra, hold ativan.     Time: [id number]  Signed:  Niel Hummer A  [facility] 02-Jul-2016, 4:05 PM

## 2016-06-21 NOTE — Consult Note (Signed)
[place] A. Meredith Laughter, MD     [email or web address]          Meredith [name] is an 76 y.o. female.   ASSESSMENT/PLAN: Severe anoxic encephalopathy: The prognosis for functional seems poor given the examination. She has gotten 4 mg doses of Ativan and is on low-dose fentanyl. Certainly this could affect her examination but overall I think her prognosis for functional outcome is low. I will suggest we hold the sedation and repeating the examination and 24 hours.    The patient is a 76 year old white female who was admitted to the hospital for acute COPD exacerbation. Patient has had repeat admission for the same. She is on 4 L at home. She has been intubated a few times in the past because of COPD exacerbation. The patient presented with symptoms again of exacerbation of COPD. She was found unresponsive on the floor and was in asystole. It is unclear how long she was unresponsive but she was coded receiving epinephrine. She has been placed onsuppressive medications. She is intubated. She is noted to have facial twitching and therefore she was given Ativan, fentanyl, Keppra and Dilantin. She received 4 doses of Ativan 1 mg for a total of 4 mg. She is unresponsive. The baseline that the patient has not been functioning well due to her multiple comorbidities in particular COPD. The family is considering the terminal wean but a neurological consultation has been requested to help determine functional come.  GENERAL: The patient is intubated. She is on low-dose fentanyl 2.5.   HEENT: Supple. Atraumatic normocephalic. The patient has rhythmic eye opening and the lower facial twitching bilaterally.  ABDOMEN: soft  EXTREMITIES: No edema   BACK: Normal.  SKIN: Normal by inspection.    MENTAL STATUS: Spontaneous rhythmic eye opening but she does not grimace is too painful stimuli. She does not focuses and tracks. She does not follow commands.  CRANIAL NERVES: Pupils are equal,  round and reactive to light. Corneal reflexes are present but diminished. Oculocephalic reflexes are intact. Gag and cough reflexes are present. Facial muscle strength is symmetric.  MOTOR: There is no response to deep painful stimuli bilaterally.  COORDINATION: No tremors, movements or parkinsonism observed.  REFLEXES: Deep tendon reflexes are symmetrical but diminished.   SENSATION: No response to deep painful stimuli.    Blood pressure 139/78, pulse (!) 128, temperature 98.5 F (36.9 C), temperature source Axillary, resp. rate (!) 28, height _0  (1.676 m), weight 124 lb 9 oz (56.5 kg), SpO2 97 %.  Past Medical History:  Diagnosis Date  . Anxiety   . Chronic respiratory failure with hypoxia and hypercapnia (HCC)    Nocturnal BiPAP / 4 L/m at rest & 5 L/m with exertion  . Colon cancer ([place]) 2002   colon cancer  . 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 DM type 2 (diabetes mellitus, type 2) (HCC)    steroid induced  . Insomnia     Past Surgical History:  Procedure Laterality Date  . COLON SURGERY    . Cyst removed from Neck    . FOOT SURGERY Right     Family History  Problem Relation Age of Onset  . COPD Mother   . Heart attack Father   . COPD Sister   . COPD Brother   . COPD Sister     Social History:  reports that she quit smoking about 7 years ago. Her smoking use included Cigarettes. She has a 80.00 pack-year smoking history. She quit  smokeless tobacco use about 7 years ago. She reports that she does not drink alcohol or use drugs.  Allergies:  Allergies  Allergen Reactions  . Morphine     Made high strung and have hallucination   . Tramadol Other (See Comments)    Makes her mean  . Zoloft [Sertraline Hcl] Other (See Comments)    Made her cry a lot    Medications: Prior to Admission medications   Medication Sig Start Date End Date Taking? Authorizing Provider  albuterol (ACCUNEB) 1.25 MG/3ML nebulizer solution Take 3 mLs by nebulization every 4 (four) hours as  needed for shortness of breath. 05/24/16  Yes Historical Provider, MD  albuterol (PROVENTIL HFA;VENTOLIN HFA) 108 (90 Base) MCG/ACT inhaler Inhale 2 puffs into the lungs every 6 (six) hours as needed for wheezing or shortness of breath. 09/11/15  Yes Deneise Lever, MD  ALPRAZolam Duanne Moron) 1 MG tablet Take 1 mg by mouth 3 (three) times daily as needed for anxiety or sleep. Take 1/2 to 1 tablet by mouth three times daily as needed   Yes Historical Provider, MD  buPROPion (WELLBUTRIN SR) 150 MG 12 hr tablet Take 1 tablet by mouth daily. 04/29/16  Yes Historical Provider, MD  glipiZIDE (GLUCOTROL XL) 2.5 MG 24 hr tablet Take 2.5 mg by mouth daily.     Yes Historical Provider, MD  Glycopyrrolate-Formoterol (BEVESPI AEROSPHERE) 9-4.8 MCG/ACT AERO Inhale 2 puffs into the lungs 2 (two) times daily. 04/12/16  Yes Deneise Lever, MD  guaiFENesin (MUCINEX) 600 MG 12 hr tablet Take 600 mg by mouth 2 (two) times daily.    Yes Historical Provider, MD  KLOR-CON M20 20 MEQ tablet Take 1 tablet by mouth daily. 04/29/16  Yes Historical Provider, MD  Multiple Vitamins-Minerals (CENTRUM SILVER ADULT 50+) TABS Take 1 tablet by mouth daily.   Yes Historical Provider, MD  naproxen sodium (ANAPROX) 220 MG tablet Take 220 mg by mouth 2 (two) times daily as needed (pain).   Yes Historical Provider, MD  nystatin (MYCOSTATIN) 100000 UNIT/ML suspension TAKE 5MLS BY MOUTH 4 TIMES A DAY Patient taking differently: Use as directed 5 mLs in the mouth or throat 2 (two) times daily as needed (for thrush).  01/15/15  Yes Deneise Lever, MD  omeprazole (PRILOSEC) 40 MG capsule Take 1 capsule (40 mg total) by mouth daily. 02/09/16  Yes Orson Eva, MD  predniSONE (DELTASONE) 5 MG tablet Take 1-2 tablets (5-10 mg total) by mouth daily. 05/10/16  Yes Deneise Lever, MD  sertraline (ZOLOFT) 100 MG tablet Take 1 tablet by mouth daily. 04/29/16  Yes Historical Provider, MD  SYMBICORT 160-4.5 MCG/ACT inhaler USE 2 PUFFS TWICE DAILY 07/04/15  Yes  Historical Provider, MD  triamcinolone ointment (KENALOG) 0.1 % Apply 1 application topically daily as needed (for itching).  12/01/15  Yes Historical Provider, MD  vitamin C (ASCORBIC ACID) 500 MG tablet Take 1,000 mg by mouth daily.    Yes Historical Provider, MD    Scheduled Meds: . docusate sodium  100 mg Oral BID  . enoxaparin (LOVENOX) injection  40 mg Subcutaneous Q24H  . feeding supplement (ENSURE ENLIVE)  237 mL Oral Q24H  . insulin aspart  0-9 Units Subcutaneous TID WC  . ipratropium-albuterol  3 mL Nebulization Q6H WA  . levETIRAcetam  1,000 mg Intravenous Q12H  . methylPREDNISolone (SOLU-MEDROL) injection  60 mg Intravenous Q6H  . pantoprazole (PROTONIX) IV  40 mg Intravenous Q12H  . phenytoin (DILANTIN) IV  1,000 mg Intravenous Once  .  piperacillin-tazobactam (ZOSYN)  IV  3.375 g Intravenous Q8H  . vancomycin  500 mg Intravenous Q24H   Continuous Infusions: . sodium chloride 75 mL/hr at 2016/07/01 0600  . fentaNYL infusion INTRAVENOUS 25 mcg/hr (01-Jul-2016 0600)  . norepinephrine (LEVOPHED) Adult infusion 2 mcg/min (07-01-16 0600)   PRN Meds:.acetaminophen **OR** acetaminophen, albuterol, ALPRAZolam, fentaNYL, LORazepam, midazolam, midazolam, nystatin, ondansetron **OR** ondansetron (ZOFRAN) IV     Results for orders placed or performed during the hospital encounter of 06/11/2016 (from the past 48 hour(s))  Glucose, capillary     Status: Abnormal   Collection Time: 06/08/16 11:05 AM  Result Value Ref Range   Glucose-Capillary 254 (H) 65 - 99 mg/dL  Glucose, capillary     Status: Abnormal   Collection Time: 06/08/16  4:27 PM  Result Value Ref Range   Glucose-Capillary 240 (H) 65 - 99 mg/dL  Glucose, capillary     Status: Abnormal   Collection Time: 06/08/16  9:21 PM  Result Value Ref Range   Glucose-Capillary 205 (H) 65 - 99 mg/dL   Comment 1 Notify RN    Comment 2 Document in Chart   Glucose, capillary     Status: Abnormal   Collection Time: 06/09/16  7:17 AM    Result Value Ref Range   Glucose-Capillary 191 (H) 65 - 99 mg/dL  Glucose, capillary     Status: Abnormal   Collection Time: 06/09/16 11:45 AM  Result Value Ref Range   Glucose-Capillary 140 (H) 65 - 99 mg/dL  Glucose, capillary     Status: Abnormal   Collection Time: 06/09/16  4:01 PM  Result Value Ref Range   Glucose-Capillary 158 (H) 65 - 99 mg/dL  Glucose, capillary     Status: Abnormal   Collection Time: 06/09/16  4:22 PM  Result Value Ref Range   Glucose-Capillary 245 (H) 65 - 99 mg/dL  CBC     Status: Abnormal   Collection Time: 06/09/16  4:30 PM  Result Value Ref Range   WBC 10.2 4.0 - 10.5 K/uL   RBC 3.68 (L) 3.87 - 5.11 MIL/uL   Hemoglobin 10.5 (L) 12.0 - 15.0 g/dL   HCT 38.1 36.0 - 46.0 %   MCV 103.5 (H) 78.0 - 100.0 fL   MCH 28.5 26.0 - 34.0 pg   MCHC 27.6 (L) 30.0 - 36.0 g/dL   RDW 12.8 11.5 - 15.5 %   Platelets 200 150 - 400 K/uL  Basic metabolic panel     Status: Abnormal   Collection Time: 06/09/16  4:30 PM  Result Value Ref Range   Sodium 139 135 - 145 mmol/L   Potassium 5.9 (H) 3.5 - 5.1 mmol/L    Comment: CRITICAL RESULT CALLED TO, READ BACK BY AND VERIFIED WITH: COCKRAM,C AT 1723 ON 12.20.17 BY BAYSE,L    Chloride 93 (L) 101 - 111 mmol/L   CO2 31 22 - 32 mmol/L   Glucose, Bld 354 (H) 65 - 99 mg/dL   BUN 29 (H) 6 - 20 mg/dL   Creatinine, Ser 1.47 (H) 0.44 - 1.00 mg/dL   Calcium 8.5 (L) 8.9 - 10.3 mg/dL   GFR calc non Af Amer 34 (L) >60 mL/min   GFR calc Af Amer 40 (L) >60 mL/min    Comment: (NOTE) The eGFR has been calculated using the CKD EPI equation. This calculation has not been validated in all clinical situations. eGFR's persistently <60 mL/min signify possible Chronic Kidney Disease.    Anion gap 15 5 - 15  Troponin I (  q 6hr x 3)     Status: Abnormal   Collection Time: 06/09/16  4:30 PM  Result Value Ref Range   Troponin I 0.03 (HH) <0.03 ng/mL    Comment: CRITICAL RESULT CALLED TO, READ BACK BY AND VERIFIED WITH: COCKRAM,C AT 1723 ON  12.20.17 BY BAYSE,L   Magnesium     Status: None   Collection Time: 06/09/16  4:31 PM  Result Value Ref Range   Magnesium 1.8 1.7 - 2.4 mg/dL  Blood gas, arterial     Status: Abnormal   Collection Time: 06/09/16  5:05 PM  Result Value Ref Range   FIO2 1.00    Delivery systems VENTILATOR    Mode PRESSURE REGULATED VOLUME CONTROL    VT 500 mL   LHR 18.0 resp/min   Peep/cpap 5.0 cm H20   pH, Arterial 7.260 (L) 7.350 - 7.450   pCO2 arterial 65.6 (HH) 32.0 - 48.0 mmHg    Comment: CRITICAL RESULT CALLED TO, READ BACK BY AND VERIFIED WITH: LAWARENCE HYLTON,RN AT 1708 BY WENDY VIA,RRT,RCP ON 06/09/2016    pO2, Arterial 219 (H) 83.0 - 108.0 mmHg   Bicarbonate 25.0 20.0 - 28.0 mmol/L   Acid-Base Excess 2.0 0.0 - 2.0 mmol/L   O2 Saturation 99.0 %   Patient temperature 37.0    Collection site LEFT RADIAL    Drawn by [id number]    Sample type ARTERIAL DRAW    Allens test (pass/fail) PASS PASS  Lactic acid, plasma     Status: Abnormal   Collection Time: 06/09/16  6:21 PM  Result Value Ref Range   Lactic Acid, Venous 4.3 (HH) 0.5 - 1.9 mmol/L    Comment: CRITICAL RESULT CALLED TO, READ BACK BY AND VERIFIED WITH: HOWARD,C AT 2000 ON 06/09/2016 BY ISLEY,B   Glucose, capillary     Status: Abnormal   Collection Time: 06/09/16  7:38 PM  Result Value Ref Range   Glucose-Capillary 199 (H) 65 - 99 mg/dL   Comment 1 Notify RN    Comment 2 Document in Chart   Blood gas, arterial     Status: Abnormal   Collection Time: 06/09/16  9:00 PM  Result Value Ref Range   FIO2 40.00    Delivery systems VENTILATOR    Mode PRESSURE REGULATED VOLUME CONTROL    VT 480 mL   LHR 28 resp/min   Peep/cpap 5.0 cm H20   pH, Arterial 7.305 (L) 7.350 - 7.450   pCO2 arterial 65.1 (HH) 32.0 - 48.0 mmHg    Comment: CRITICAL RESULT CALLED TO, READ BACK BY AND VERIFIED WITH: MCDANIEL J RN AT 2115 06/09/16 PITTMAN S RRT/RCP    pO2, Arterial 60.0 (L) 83.0 - 108.0 mmHg   Bicarbonate 27.6 20.0 - 28.0 mmol/L   Acid-Base  Excess 5.4 (H) 0.0 - 2.0 mmol/L   O2 Saturation 88.6 %   Patient temperature 37.0    Collection site LEFT RADIAL    Drawn by [id number]    Sample type ARTERIAL    Allens test (pass/fail) PASS PASS  Lactic acid, plasma     Status: Abnormal   Collection Time: 06/09/16  9:05 PM  Result Value Ref Range   Lactic Acid, Venous 2.2 (HH) 0.5 - 1.9 mmol/L    Comment: CRITICAL RESULT CALLED TO, READ BACK BY AND VERIFIED WITH: HOWARD,C AT 2230 ON 06/09/2016 BY ISLEY,B   Basic metabolic panel     Status: Abnormal   Collection Time: 06/09/16  9:05 PM  Result Value Ref Range  Sodium 139 135 - 145 mmol/L   Potassium 4.5 3.5 - 5.1 mmol/L    Comment: DELTA CHECK NOTED   Chloride 98 (L) 101 - 111 mmol/L   CO2 33 (H) 22 - 32 mmol/L   Glucose, Bld 313 (H) 65 - 99 mg/dL   BUN 41 (H) 6 - 20 mg/dL   Creatinine, Ser 1.59 (H) 0.44 - 1.00 mg/dL   Calcium 8.4 (L) 8.9 - 10.3 mg/dL   GFR calc non Af Amer 31 (L) >60 mL/min   GFR calc Af Amer 36 (L) >60 mL/min    Comment: (NOTE) The eGFR has been calculated using the CKD EPI equation. This calculation has not been validated in all clinical situations. eGFR's persistently <60 mL/min signify possible Chronic Kidney Disease.    Anion gap 8 5 - 15  Troponin I (q 6hr x 3)     Status: Abnormal   Collection Time: 06/09/16 10:18 PM  Result Value Ref Range   Troponin I 0.10 (HH) <0.03 ng/mL    Comment: CRITICAL VALUE NOTED.  VALUE IS CONSISTENT WITH PREVIOUSLY REPORTED AND CALLED VALUE.  Glucose, capillary     Status: Abnormal   Collection Time: Jun 26, 2016 12:36 AM  Result Value Ref Range   Glucose-Capillary 315 (H) 65 - 99 mg/dL   Comment 1 Notify RN    Comment 2 Document in Chart   Glucose, capillary     Status: Abnormal   Collection Time: 06-26-16  3:38 AM  Result Value Ref Range   Glucose-Capillary 235 (H) 65 - 99 mg/dL  Basic metabolic panel     Status: Abnormal   Collection Time: June 26, 2016  4:12 AM  Result Value Ref Range   Sodium 140 135 - 145 mmol/L    Potassium 5.0 3.5 - 5.1 mmol/L   Chloride 97 (L) 101 - 111 mmol/L   CO2 32 22 - 32 mmol/L   Glucose, Bld 248 (H) 65 - 99 mg/dL   BUN 51 (H) 6 - 20 mg/dL   Creatinine, Ser 2.36 (H) 0.44 - 1.00 mg/dL   Calcium 8.4 (L) 8.9 - 10.3 mg/dL   GFR calc non Af Amer 19 (L) >60 mL/min   GFR calc Af Amer 22 (L) >60 mL/min    Comment: (NOTE) The eGFR has been calculated using the CKD EPI equation. This calculation has not been validated in all clinical situations. eGFR's persistently <60 mL/min signify possible Chronic Kidney Disease.    Anion gap 11 5 - 15  CBC     Status: Abnormal   Collection Time: 06-26-16  4:12 AM  Result Value Ref Range   WBC 14.8 (H) 4.0 - 10.5 K/uL   RBC 3.91 3.87 - 5.11 MIL/uL   Hemoglobin 11.6 (L) 12.0 - 15.0 g/dL   HCT 38.1 36.0 - 46.0 %   MCV 97.4 78.0 - 100.0 fL   MCH 29.7 26.0 - 34.0 pg   MCHC 30.4 30.0 - 36.0 g/dL   RDW 13.5 11.5 - 15.5 %   Platelets 217 150 - 400 K/uL  Magnesium     Status: None   Collection Time: 06/26/2016  4:12 AM  Result Value Ref Range   Magnesium 1.7 1.7 - 2.4 mg/dL  Troponin I     Status: Abnormal   Collection Time: 2016-06-26  4:12 AM  Result Value Ref Range   Troponin I 0.20 (HH) <0.03 ng/mL    Comment: CRITICAL RESULT CALLED TO, READ BACK BY AND VERIFIED WITH: DANIELS,J AT 5:50AM ON 06-26-2016 BY  FESTERMAN,C   Blood gas, arterial     Status: Abnormal   Collection Time: 07/01/16  5:35 AM  Result Value Ref Range   FIO2 50.00    Delivery systems VENTILATOR    Mode PRESSURE REGULATED VOLUME CONTROL    VT 480 mL   LHR 28 resp/min   Peep/cpap 5.0 cm H20   pH, Arterial 7.366 7.350 - 7.450   pCO2 arterial 57.7 (H) 32.0 - 48.0 mmHg   pO2, Arterial 88.1 83.0 - 108.0 mmHg   Bicarbonate 29.8 (H) 20.0 - 28.0 mmol/L   Acid-Base Excess 6.8 (H) 0.0 - 2.0 mmol/L   O2 Saturation 95.3 %   Patient temperature 38.2    Collection site LEFT RADIAL    Drawn by ARTERIAL    Sample type 21,310    Allens test (pass/fail) PASS PASS  Glucose,  capillary     Status: Abnormal   Collection Time: 07-01-16  7:45 AM  Result Value Ref Range   Glucose-Capillary 234 (H) 65 - 99 mg/dL   Comment 1 Notify RN    Comment 2 Document in Chart     Studies/Results:     Jahnyla Parrillo A. Meredith [name], M.D.  Diplomate, Tax adviser of Psychiatry and Neurology ( Neurology). 07/01/16, 8:32 AM

## 2016-06-21 NOTE — Procedures (Signed)
**Note De-Identified Keondra Haydu Obfuscation** Extubation Procedure Note  Patient Details:   Name: Meredith [name] DOB: [birth year] MRN: [id number]   Airway Documentation:     Evaluation  O2 sats: stable throughout and currently acceptable Complications: No apparent complications Patient did tolerate procedure well. Bilateral Breath Sounds: Diminished   No  Maeli Spacek, Penni Bombard 2016/07/02, 1:24 PM

## 2016-06-21 DEATH — deceased

## 2016-08-18 ENCOUNTER — Ambulatory Visit: Payer: Medicare HMO | Admitting: Internal Medicine

## 2017-12-06 IMAGING — DX DG CHEST 2V
2 series · 2 of 2 positions shown · non-contrast
Comparison: 03/04/2016.

CLINICAL DATA: Fall.

EXAM:
CHEST  2 VIEW

[chest lat]
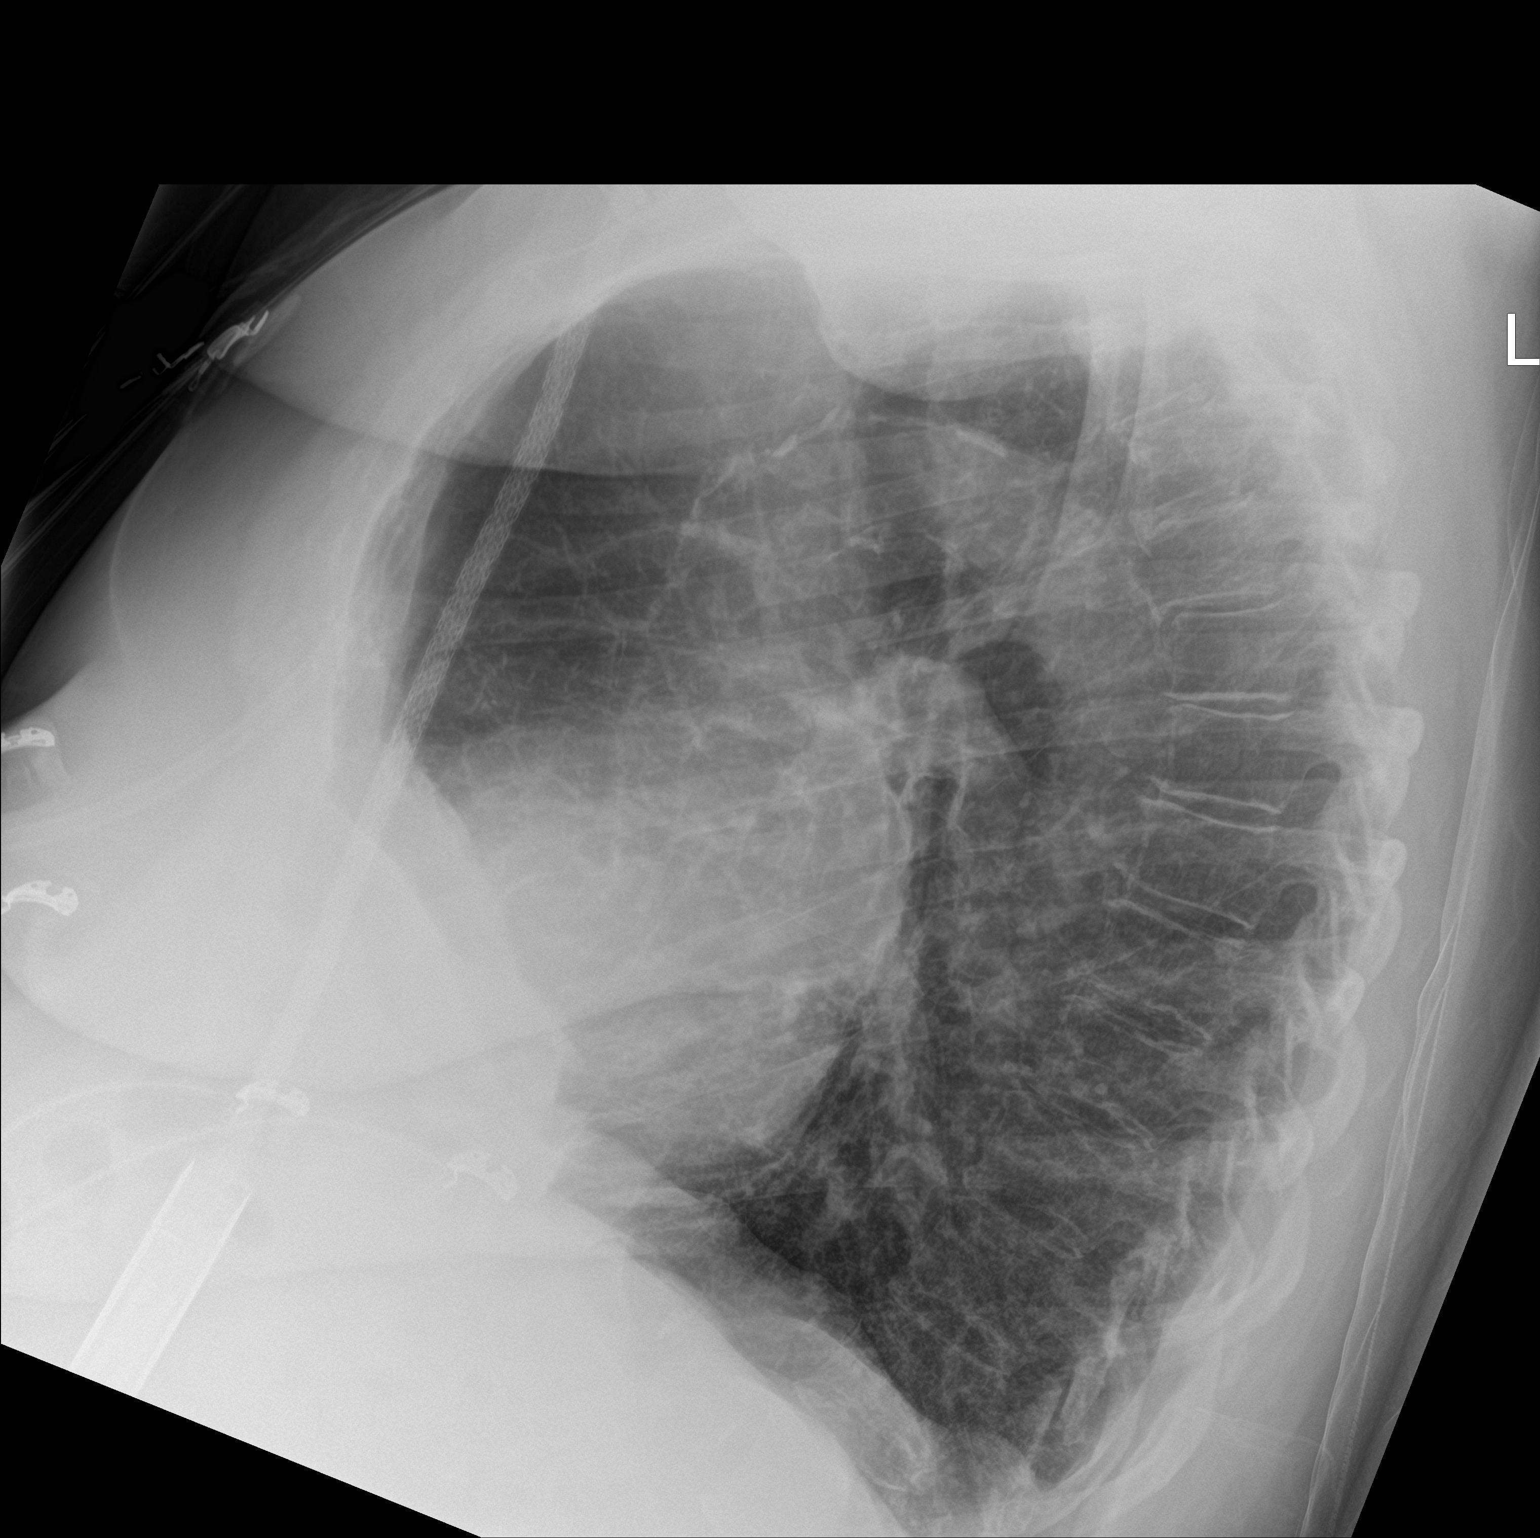

[chest ap]
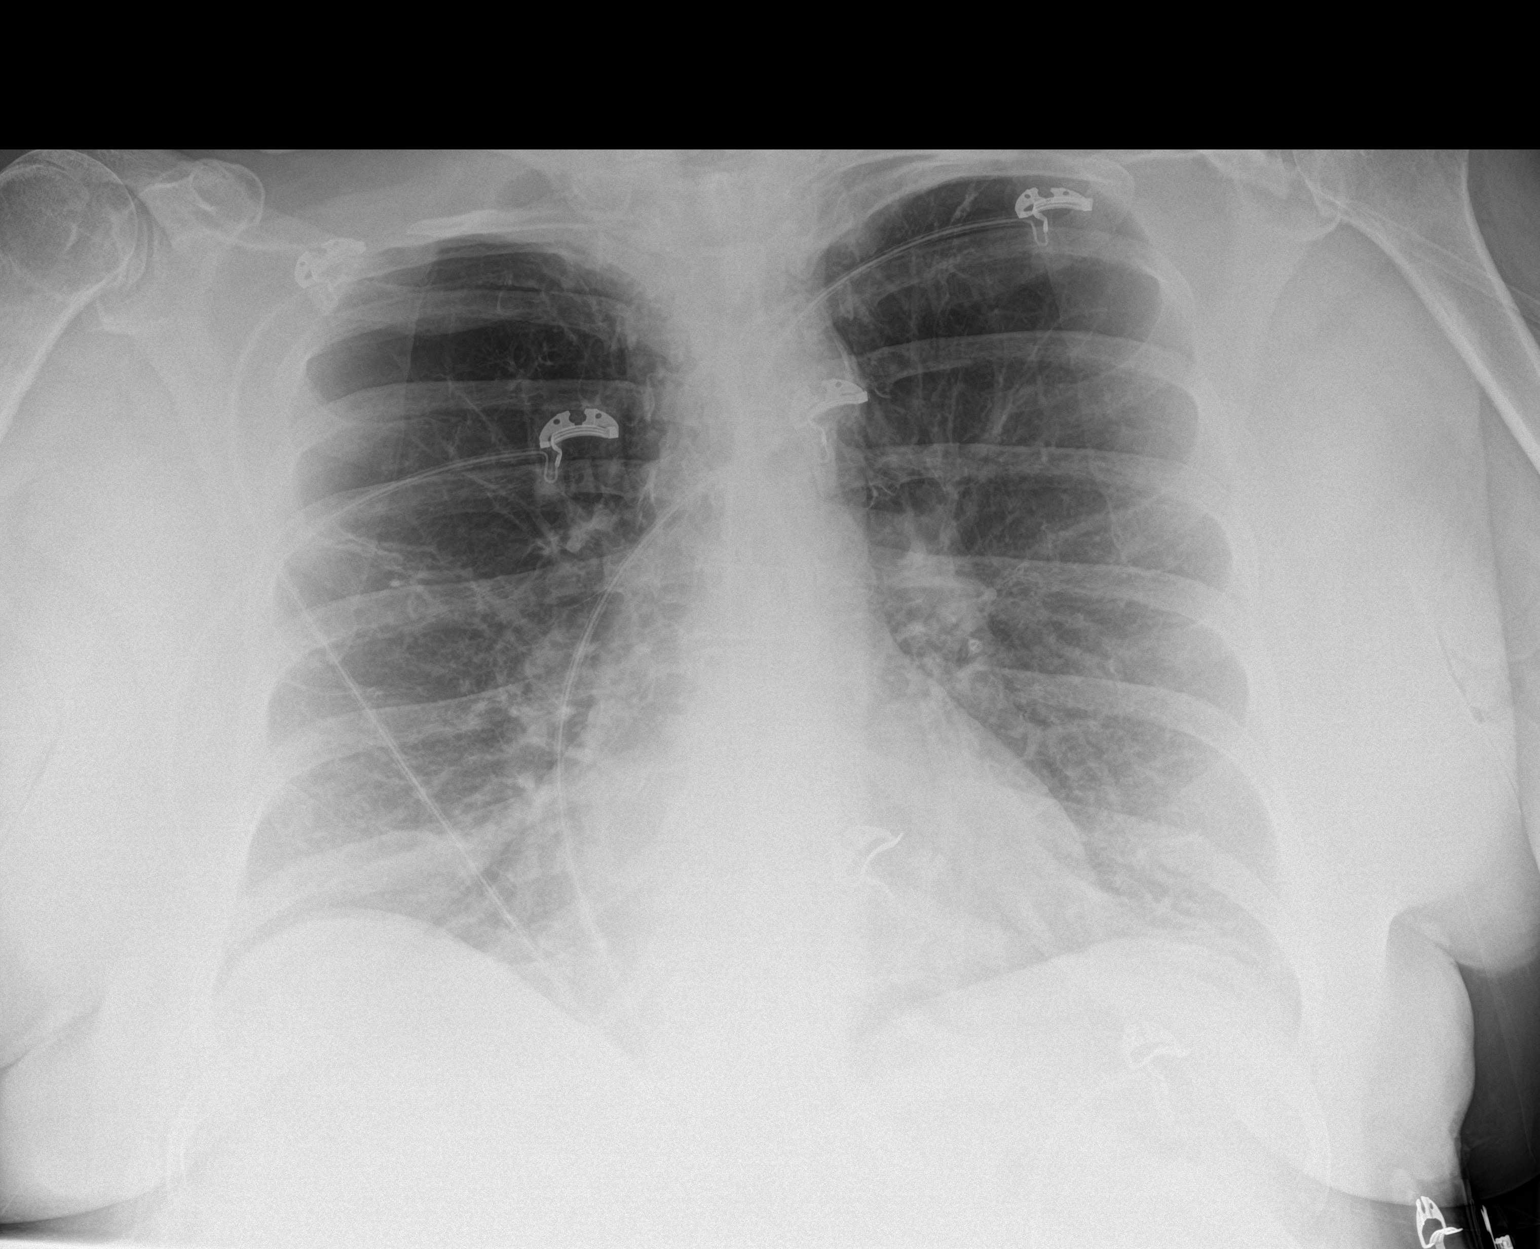

[2 of 2 positions shown; findings below may reference images not displayed]

FINDINGS: Mediastinum and hilar structures normal. Low lung volumes with mild
basilar atelectasis. COPD. Bilateral pleuroparenchymal thickening
noted consistent with scarring. Heart size normal. No acute bony
abnormality.
IMPRESSION: 1. Low lung volumes with mild basilar atelectasis. COPD. Bilateral
pleuroparenchymal thickening noted consistent with scarring.

2.  Heart size normal.

## 2017-12-06 IMAGING — DX DG TIBIA/FIBULA 2V*L*
3 series · 3 of 3 positions shown · non-contrast
Comparison: None.

CLINICAL DATA: Pt states she fell but unsure when/abrasion and
swelling anterior aspect left lower leg

EXAM:
LEFT TIBIA AND FIBULA - 2 VIEW

[tibia ap (1 of 2)]
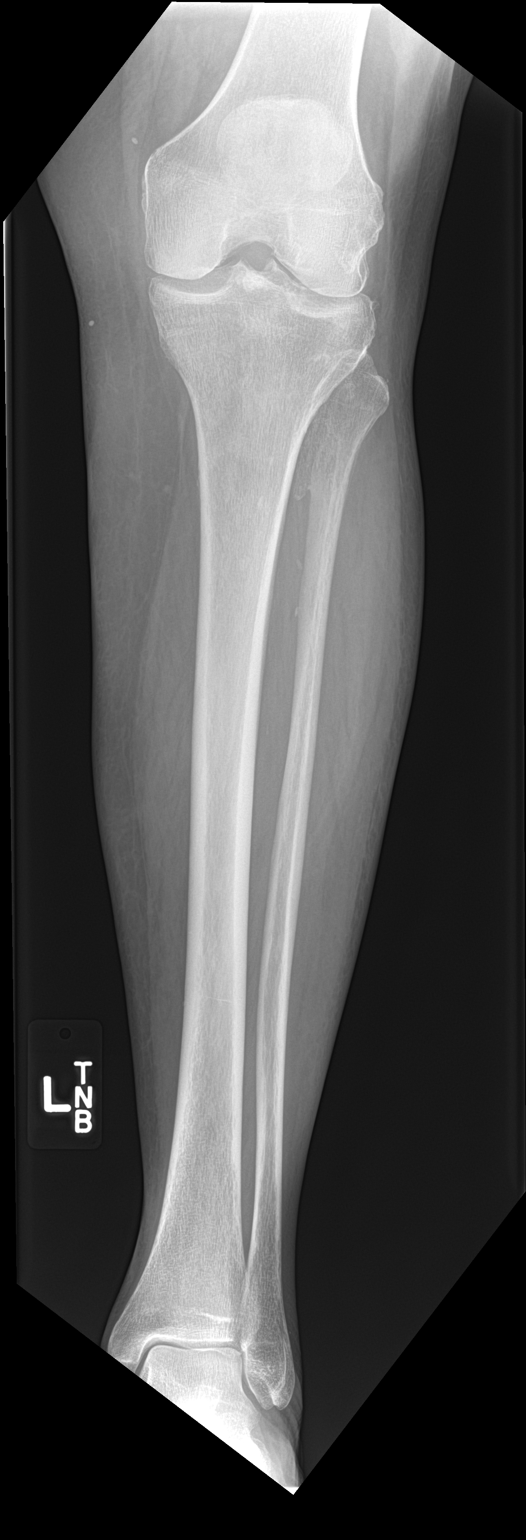

[tibia ap (2 of 2)]
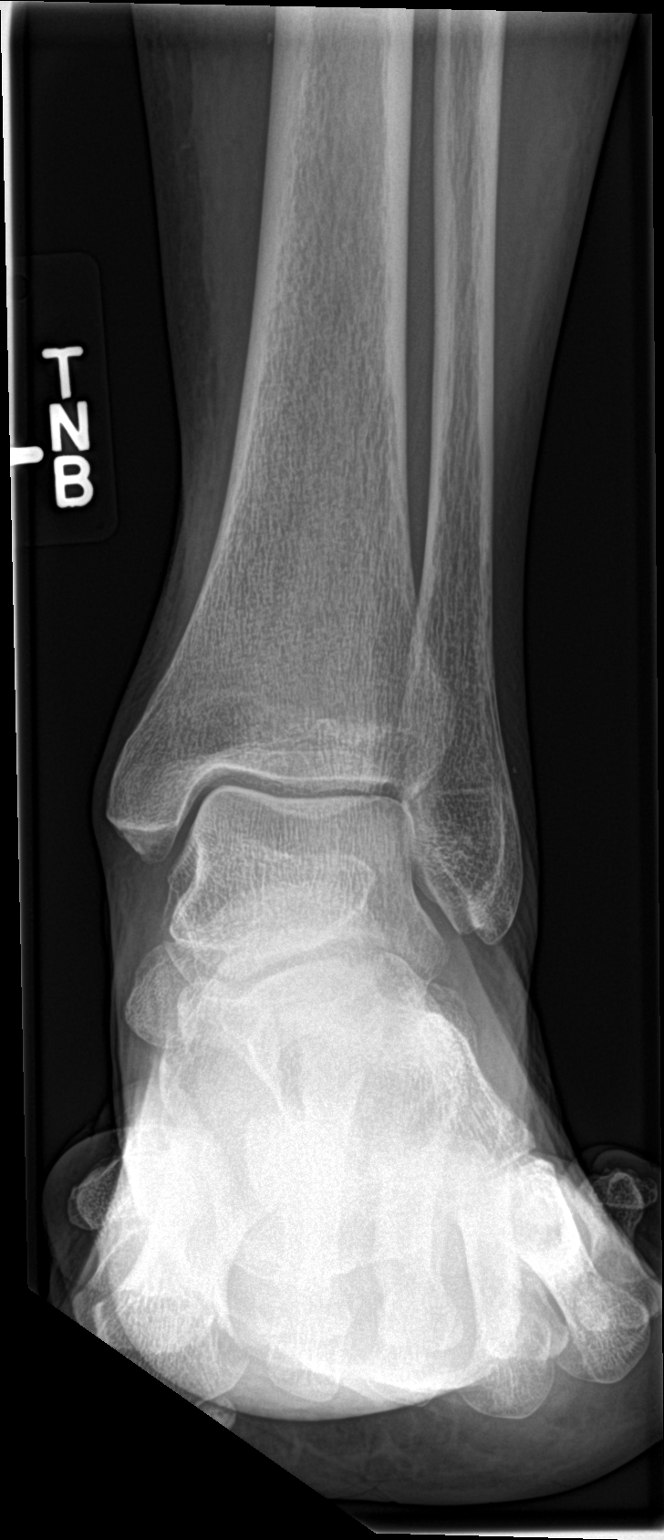

[tibia lat]
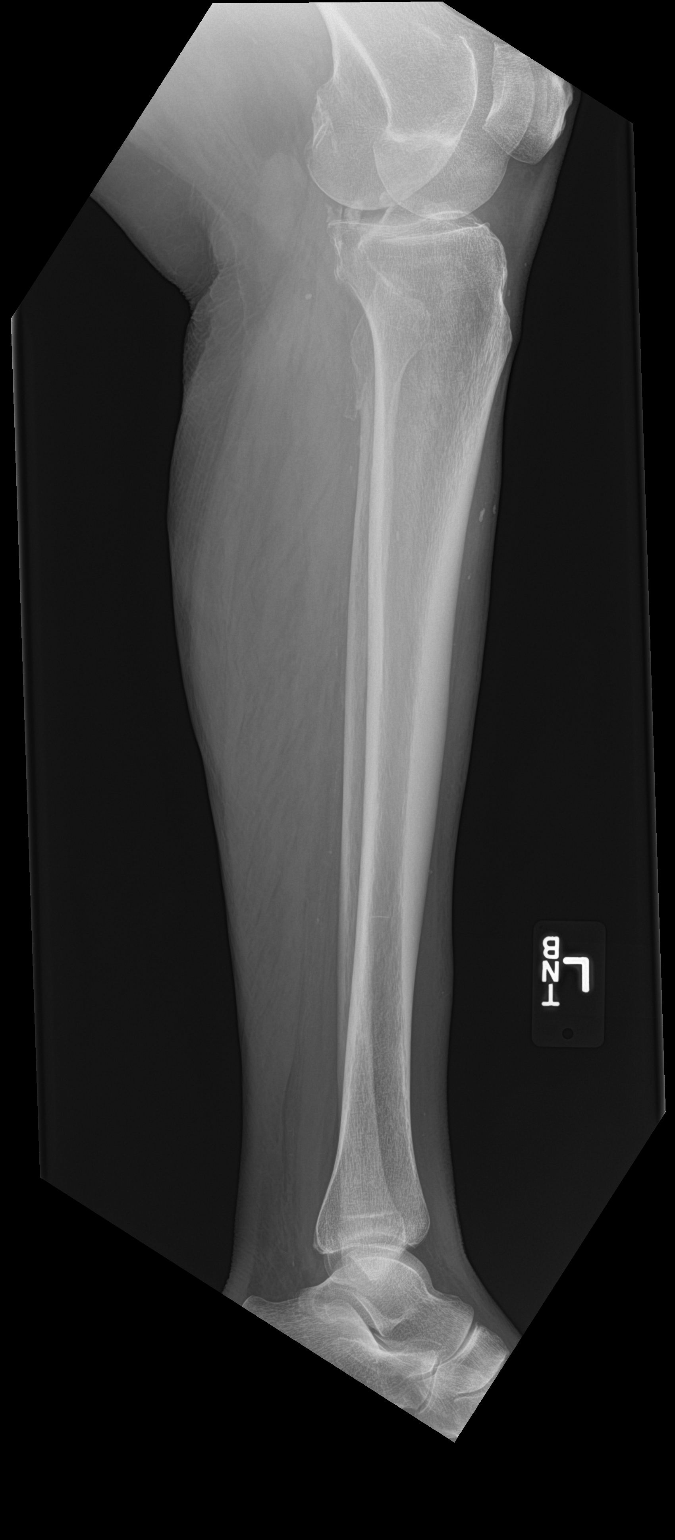

[3 of 3 positions shown; findings below may reference images not displayed]

FINDINGS: There is no evidence of fracture or other focal bone lesions.
Popliteal arterial calcifications noted. Soft tissues are
unremarkable.
IMPRESSION: No acute findings.

## 2017-12-08 IMAGING — CR DG CHEST 1V PORT SAME DAY
1 series · 3 of 3 positions shown · non-contrast
Comparison: 06/09/2016

CLINICAL DATA: Right central line placement

EXAM:
PORTABLE CHEST 1 VIEW

[Series 1: ap · 0.17mm/px · 3 of 3 slices shown]
[im 1/3]
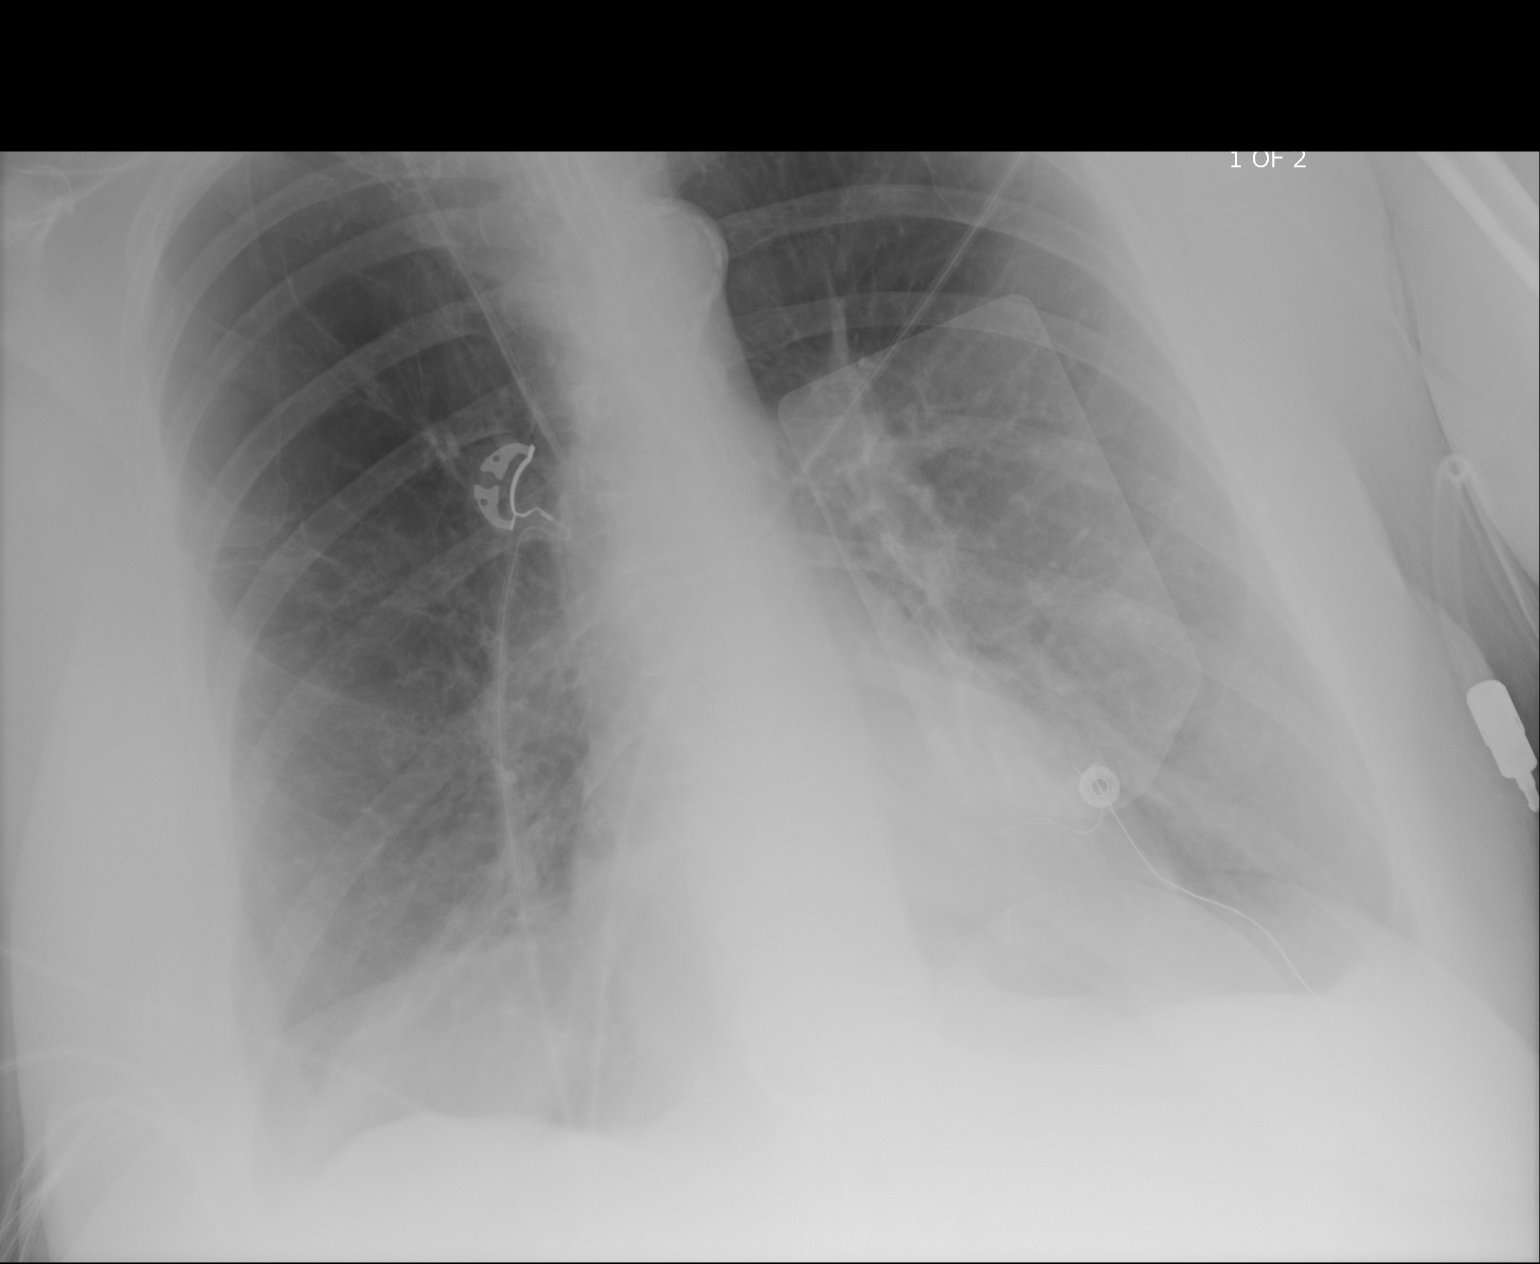
[im 2/3]
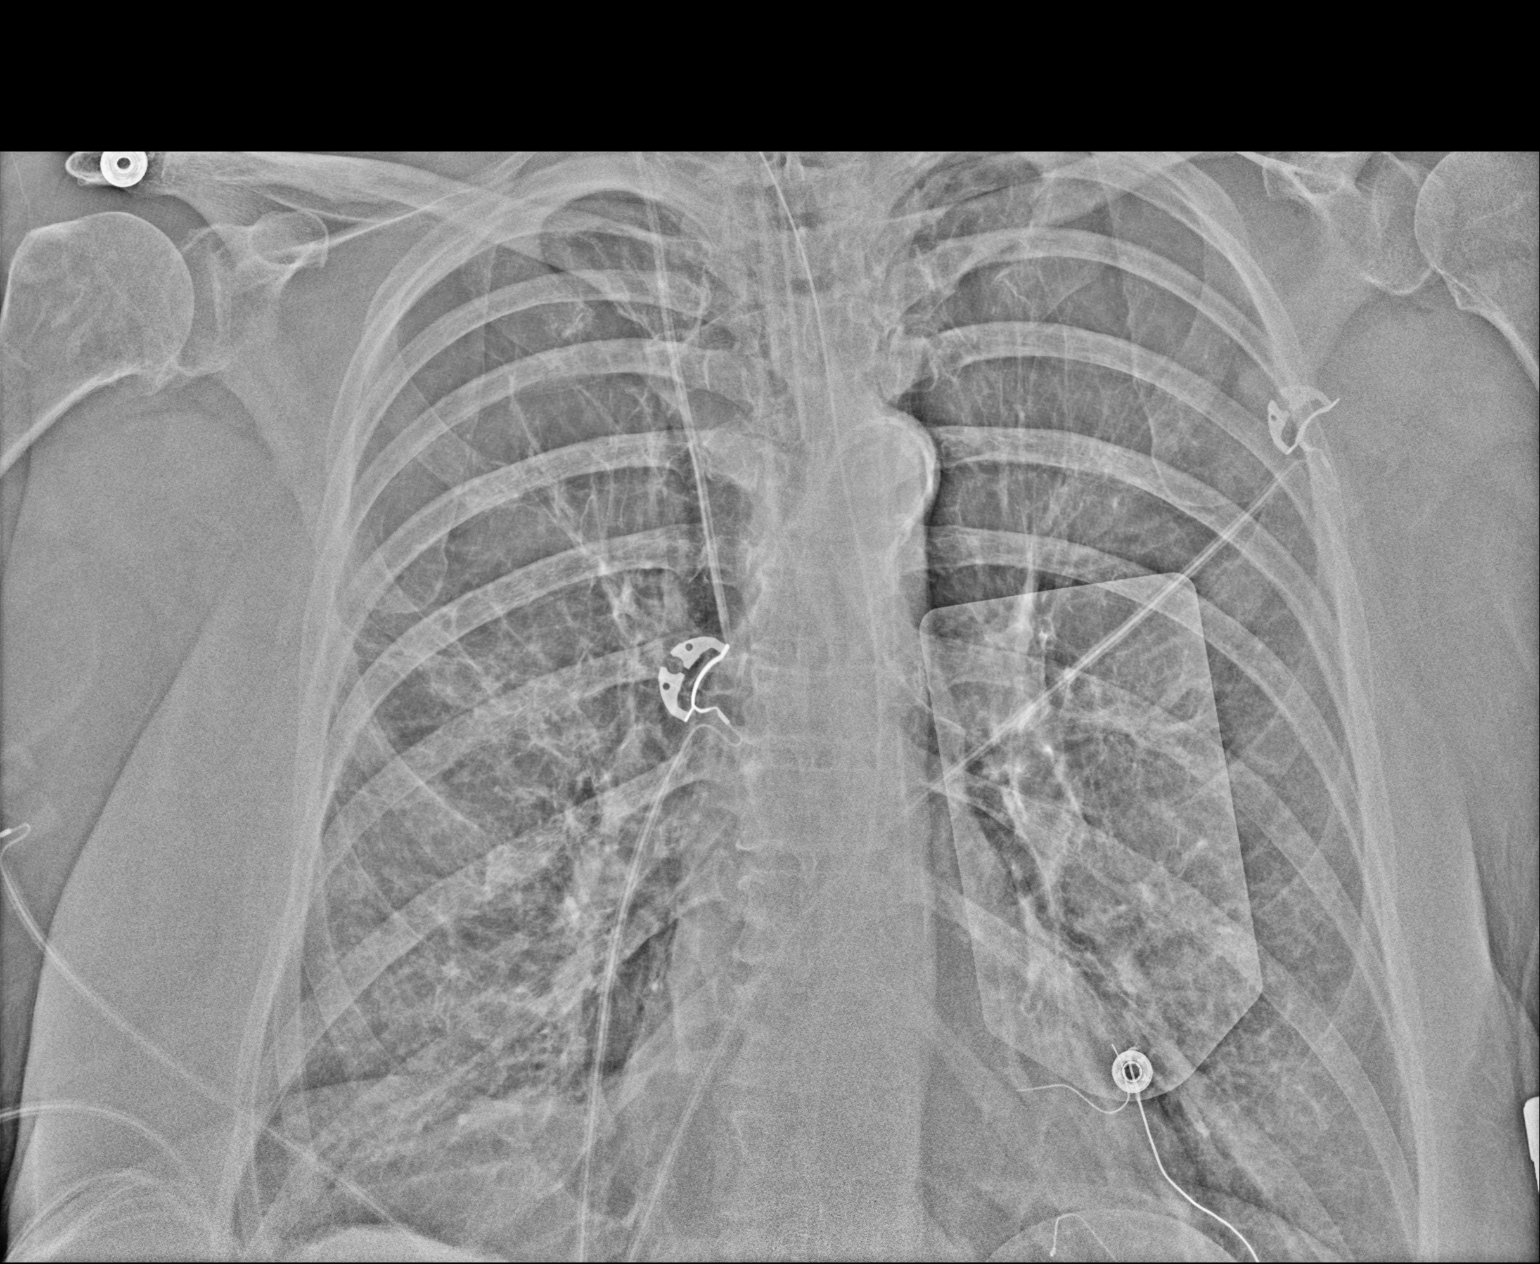
[im 3/3]
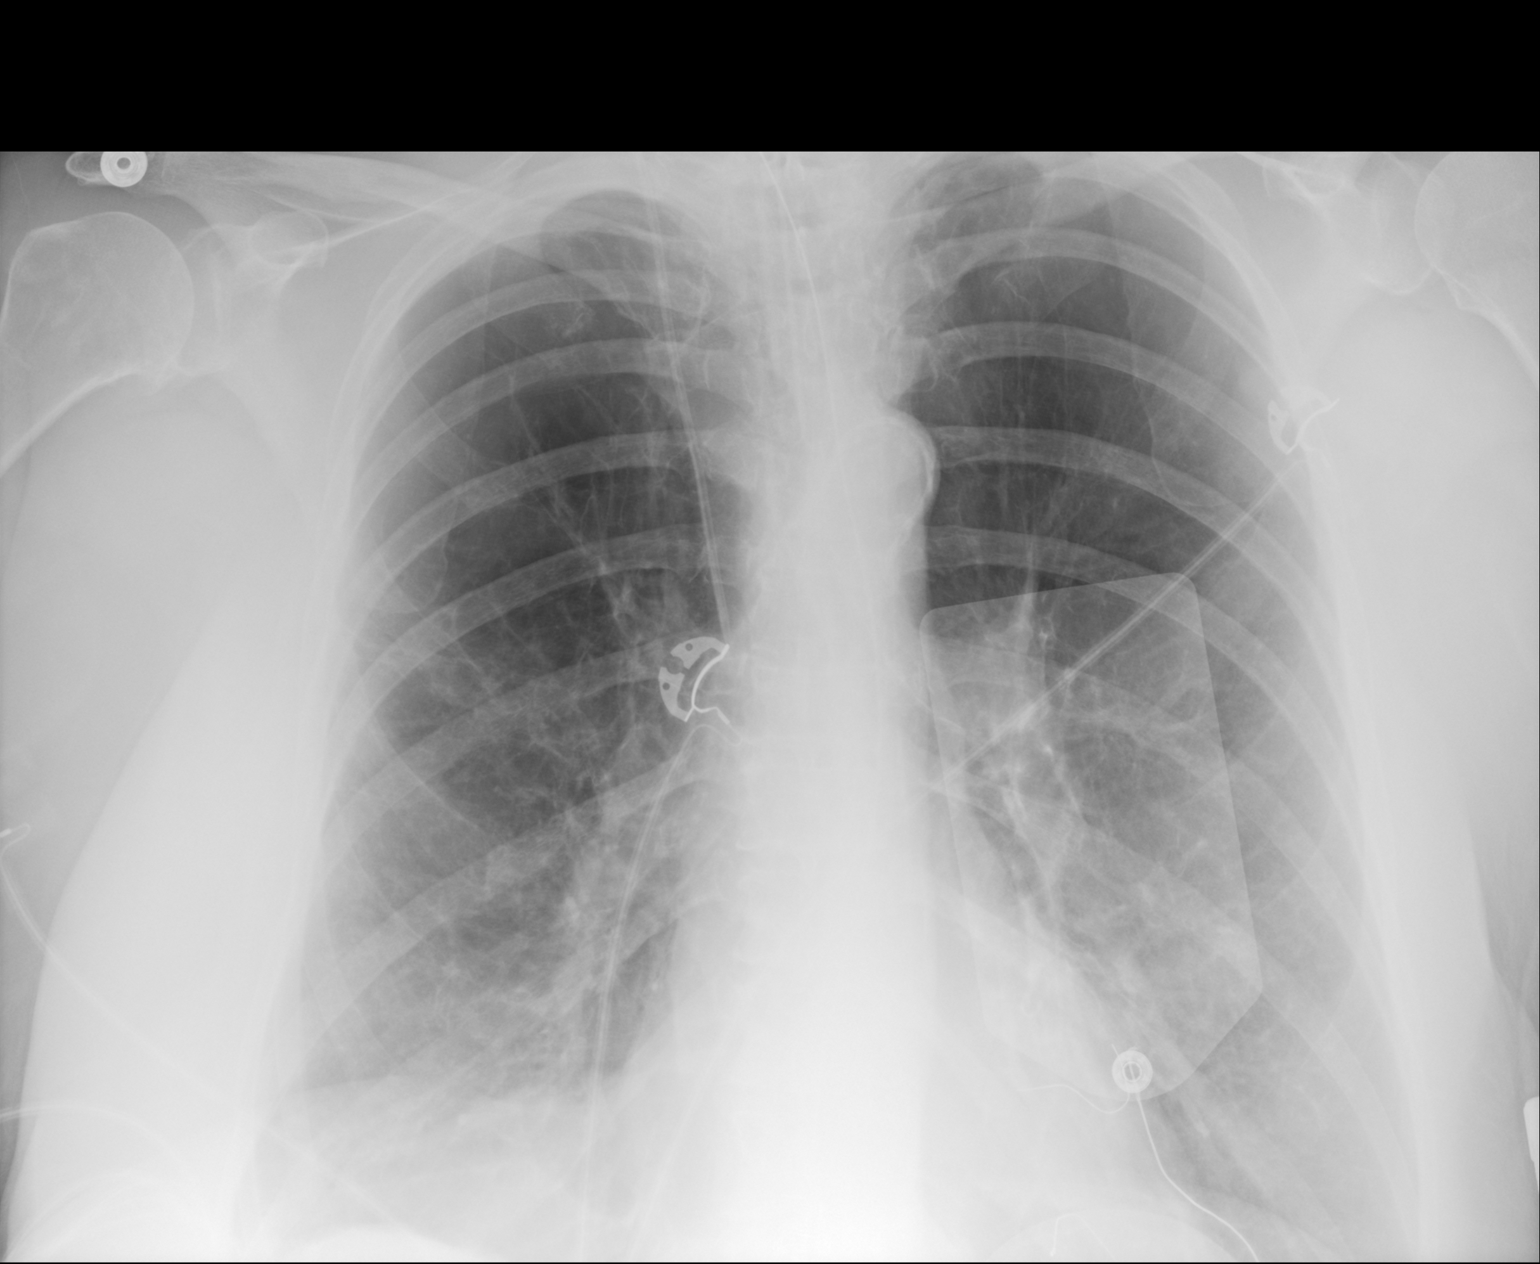

[3 of 3 positions shown; findings below may reference images not displayed]

FINDINGS: Cardiomediastinal silhouette is stable. Stable endotracheal and NG
tube position. There is right IJ central line with tip in SVC. No
pneumothorax. Right basilar atelectasis or infiltrate.
IMPRESSION: Right IJ central line with tip in SVC. No pneumothorax. Right
basilar atelectasis or infiltrate.
